# Patient Record
Sex: Female | Born: 1985 | Race: Black or African American | Hispanic: No | Marital: Married | Smoking: Current every day smoker
Health system: Southern US, Community
[De-identification: ages and names within clinical notes are randomized; demographics above are authoritative.]

## PROBLEM LIST (undated history)

## (undated) ENCOUNTER — Inpatient Hospital Stay (HOSPITAL_COMMUNITY): Payer: Self-pay

## (undated) DIAGNOSIS — R87619 Unspecified abnormal cytological findings in specimens from cervix uteri: Secondary | ICD-10-CM

## (undated) DIAGNOSIS — I10 Essential (primary) hypertension: Secondary | ICD-10-CM

## (undated) DIAGNOSIS — O24419 Gestational diabetes mellitus in pregnancy, unspecified control: Secondary | ICD-10-CM

## (undated) DIAGNOSIS — R87629 Unspecified abnormal cytological findings in specimens from vagina: Secondary | ICD-10-CM

## (undated) DIAGNOSIS — N809 Endometriosis, unspecified: Secondary | ICD-10-CM

## (undated) DIAGNOSIS — Z3400 Encounter for supervision of normal first pregnancy, unspecified trimester: Secondary | ICD-10-CM

## (undated) DIAGNOSIS — E782 Mixed hyperlipidemia: Secondary | ICD-10-CM

## (undated) DIAGNOSIS — F17200 Nicotine dependence, unspecified, uncomplicated: Secondary | ICD-10-CM

## (undated) DIAGNOSIS — G4733 Obstructive sleep apnea (adult) (pediatric): Secondary | ICD-10-CM

## (undated) HISTORY — PX: OTHER SURGICAL HISTORY: SHX169

## (undated) HISTORY — DX: Gestational diabetes mellitus in pregnancy, unspecified control: O24.419

## (undated) HISTORY — DX: Encounter for supervision of normal first pregnancy, unspecified trimester: Z34.00

## (undated) HISTORY — DX: Essential (primary) hypertension: I10

## (undated) HISTORY — DX: Obstructive sleep apnea (adult) (pediatric): G47.33

## (undated) HISTORY — DX: Morbid (severe) obesity due to excess calories: E66.01

## (undated) HISTORY — DX: Endometriosis, unspecified: N80.9

## (undated) HISTORY — DX: Mixed hyperlipidemia: E78.2

## (undated) HISTORY — PX: TONSILECTOMY/ADENOIDECTOMY WITH MYRINGOTOMY: SHX6125

---
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
## 2005-05-10 ENCOUNTER — Emergency Department (HOSPITAL_COMMUNITY): Admission: EM | Admit: 2005-05-10 | Discharge: 2005-05-10 | Payer: Self-pay | Admitting: Family Medicine

## 2006-01-24 ENCOUNTER — Emergency Department (HOSPITAL_COMMUNITY): Admission: EM | Admit: 2006-01-24 | Discharge: 2006-01-24 | Payer: Self-pay | Admitting: Emergency Medicine

## 2006-02-22 ENCOUNTER — Ambulatory Visit: Payer: Self-pay | Admitting: Family Medicine

## 2006-07-29 ENCOUNTER — Emergency Department (HOSPITAL_COMMUNITY): Admission: EM | Admit: 2006-07-29 | Discharge: 2006-07-29 | Payer: Self-pay | Admitting: Emergency Medicine

## 2007-05-05 ENCOUNTER — Emergency Department (HOSPITAL_COMMUNITY): Admission: EM | Admit: 2007-05-05 | Discharge: 2007-05-05 | Payer: Self-pay | Admitting: Emergency Medicine

## 2007-09-01 ENCOUNTER — Encounter (INDEPENDENT_AMBULATORY_CARE_PROVIDER_SITE_OTHER): Payer: Self-pay | Admitting: Family Medicine

## 2007-09-01 ENCOUNTER — Ambulatory Visit: Payer: Self-pay | Admitting: Family Medicine

## 2007-09-01 LAB — CONVERTED CEMR LAB
ALT: 14 units/L (ref 0–35)
Alkaline Phosphatase: 57 units/L (ref 39–117)
Basophils Absolute: 0 10*3/uL (ref 0.0–0.1)
Basophils Relative: 0 % (ref 0–1)
CO2: 22 meq/L (ref 19–32)
Calcium: 9.1 mg/dL (ref 8.4–10.5)
Cholesterol: 161 mg/dL (ref 0–200)
Creatinine, Ser: 1.05 mg/dL (ref 0.40–1.20)
GC Probe Amp, Genital: NEGATIVE
Glucose, Bld: 91 mg/dL (ref 70–99)
LDL Cholesterol: 97 mg/dL (ref 0–99)
MCHC: 33.6 g/dL (ref 30.0–36.0)
Monocytes Absolute: 0.9 10*3/uL (ref 0.1–1.0)
RDW: 13.1 % (ref 11.5–15.5)
Sodium: 141 meq/L (ref 135–145)
Triglycerides: 71 mg/dL (ref <150)
WBC: 8.5 10*3/uL (ref 4.0–10.5)

## 2007-09-02 ENCOUNTER — Ambulatory Visit: Payer: Self-pay | Admitting: *Deleted

## 2008-09-15 ENCOUNTER — Emergency Department (HOSPITAL_COMMUNITY): Admission: EM | Admit: 2008-09-15 | Discharge: 2008-09-15 | Payer: Self-pay | Admitting: Emergency Medicine

## 2008-09-25 ENCOUNTER — Emergency Department (HOSPITAL_COMMUNITY): Admission: EM | Admit: 2008-09-25 | Discharge: 2008-09-25 | Payer: Self-pay | Admitting: Emergency Medicine

## 2008-10-25 ENCOUNTER — Emergency Department (HOSPITAL_COMMUNITY): Admission: EM | Admit: 2008-10-25 | Discharge: 2008-10-25 | Payer: Self-pay | Admitting: Emergency Medicine

## 2008-11-24 ENCOUNTER — Emergency Department (HOSPITAL_COMMUNITY): Admission: EM | Admit: 2008-11-24 | Discharge: 2008-11-24 | Payer: Self-pay | Admitting: Family Medicine

## 2008-12-16 ENCOUNTER — Emergency Department (HOSPITAL_COMMUNITY): Admission: EM | Admit: 2008-12-16 | Discharge: 2008-12-16 | Payer: Self-pay | Admitting: Emergency Medicine

## 2008-12-30 ENCOUNTER — Emergency Department (HOSPITAL_COMMUNITY): Admission: EM | Admit: 2008-12-30 | Discharge: 2008-12-30 | Payer: Self-pay | Admitting: Emergency Medicine

## 2009-01-06 ENCOUNTER — Emergency Department (HOSPITAL_COMMUNITY): Admission: EM | Admit: 2009-01-06 | Discharge: 2009-01-07 | Payer: Self-pay | Admitting: Emergency Medicine

## 2009-05-05 ENCOUNTER — Emergency Department (HOSPITAL_COMMUNITY): Admission: EM | Admit: 2009-05-05 | Discharge: 2009-05-05 | Payer: Self-pay | Admitting: Emergency Medicine

## 2010-08-31 ENCOUNTER — Inpatient Hospital Stay (INDEPENDENT_AMBULATORY_CARE_PROVIDER_SITE_OTHER)
Admission: RE | Admit: 2010-08-31 | Discharge: 2010-08-31 | Disposition: A | Discharge status: Home / Self Care | Payer: 59 | Source: Ambulatory Visit | Attending: Emergency Medicine | Admitting: Emergency Medicine

## 2010-08-31 DIAGNOSIS — N760 Acute vaginitis: Secondary | ICD-10-CM

## 2010-08-31 DIAGNOSIS — R300 Dysuria: Secondary | ICD-10-CM

## 2010-08-31 LAB — WET PREP, GENITAL
Clue Cells Wet Prep HPF POC: NONE SEEN
Trich, Wet Prep: NONE SEEN

## 2010-08-31 LAB — POCT PREGNANCY, URINE: Preg Test, Ur: NEGATIVE

## 2010-08-31 LAB — POCT URINALYSIS DIP (DEVICE)
Nitrite: NEGATIVE
Protein, ur: NEGATIVE mg/dL

## 2010-09-01 LAB — URINE CULTURE: Culture  Setup Time: 201203291607

## 2010-09-01 LAB — GC/CHLAMYDIA PROBE AMP, GENITAL
Chlamydia, DNA Probe: NEGATIVE
GC Probe Amp, Genital: NEGATIVE

## 2010-09-10 LAB — GC/CHLAMYDIA PROBE AMP, GENITAL: Chlamydia, DNA Probe: NEGATIVE

## 2010-09-10 LAB — URINALYSIS, ROUTINE W REFLEX MICROSCOPIC
Bilirubin Urine: NEGATIVE
Glucose, UA: NEGATIVE mg/dL
Glucose, UA: NEGATIVE mg/dL
Hgb urine dipstick: NEGATIVE
Nitrite: NEGATIVE
Protein, ur: NEGATIVE mg/dL
Specific Gravity, Urine: 1.022 (ref 1.005–1.030)
Urobilinogen, UA: 1 mg/dL (ref 0.0–1.0)
Urobilinogen, UA: 0.2 mg/dL (ref 0.0–1.0)

## 2010-09-10 LAB — POCT PREGNANCY, URINE: Preg Test, Ur: NEGATIVE

## 2010-09-12 LAB — GC/CHLAMYDIA PROBE AMP, GENITAL
Chlamydia, DNA Probe: NEGATIVE
GC Probe Amp, Genital: NEGATIVE

## 2010-09-12 LAB — RPR: RPR Ser Ql: NONREACTIVE

## 2010-09-13 LAB — URINALYSIS, ROUTINE W REFLEX MICROSCOPIC
Bilirubin Urine: NEGATIVE
Glucose, UA: NEGATIVE mg/dL
Glucose, UA: NEGATIVE mg/dL
Ketones, ur: NEGATIVE mg/dL
Nitrite: NEGATIVE
Nitrite: NEGATIVE
Protein, ur: NEGATIVE mg/dL
Specific Gravity, Urine: 1.016 (ref 1.005–1.030)
Urobilinogen, UA: 0.2 mg/dL (ref 0.0–1.0)
pH: 5.5 (ref 5.0–8.0)

## 2010-09-13 LAB — WET PREP, GENITAL
Clue Cells Wet Prep HPF POC: NONE SEEN
Trich, Wet Prep: NONE SEEN

## 2010-09-13 LAB — URINE MICROSCOPIC-ADD ON

## 2010-09-13 LAB — GC/CHLAMYDIA PROBE AMP, GENITAL
Chlamydia, DNA Probe: NEGATIVE
GC Probe Amp, Genital: NEGATIVE

## 2010-09-13 LAB — PREGNANCY, URINE: Preg Test, Ur: NEGATIVE

## 2010-10-14 ENCOUNTER — Emergency Department (HOSPITAL_COMMUNITY): Payer: Self-pay

## 2010-10-14 ENCOUNTER — Emergency Department (HOSPITAL_COMMUNITY)
Admission: EM | Admit: 2010-10-14 | Discharge: 2010-10-14 | Disposition: A | Discharge status: Home / Self Care | Payer: Self-pay | Attending: Emergency Medicine | Admitting: Emergency Medicine

## 2010-10-14 DIAGNOSIS — X500XXA Overexertion from strenuous movement or load, initial encounter: Secondary | ICD-10-CM | POA: Insufficient documentation

## 2010-10-14 DIAGNOSIS — S93409A Sprain of unspecified ligament of unspecified ankle, initial encounter: Secondary | ICD-10-CM | POA: Insufficient documentation

## 2010-10-14 DIAGNOSIS — Y92009 Unspecified place in unspecified non-institutional (private) residence as the place of occurrence of the external cause: Secondary | ICD-10-CM | POA: Insufficient documentation

## 2010-10-14 DIAGNOSIS — E669 Obesity, unspecified: Secondary | ICD-10-CM | POA: Insufficient documentation

## 2011-03-30 ENCOUNTER — Emergency Department (HOSPITAL_COMMUNITY)
Admission: EM | Admit: 2011-03-30 | Discharge: 2011-03-30 | Disposition: A | Discharge status: Home / Self Care | Payer: Self-pay | Attending: Emergency Medicine | Admitting: Emergency Medicine

## 2011-03-30 DIAGNOSIS — A499 Bacterial infection, unspecified: Secondary | ICD-10-CM | POA: Insufficient documentation

## 2011-03-30 DIAGNOSIS — N760 Acute vaginitis: Secondary | ICD-10-CM | POA: Insufficient documentation

## 2011-03-30 DIAGNOSIS — B9689 Other specified bacterial agents as the cause of diseases classified elsewhere: Secondary | ICD-10-CM | POA: Insufficient documentation

## 2011-03-30 DIAGNOSIS — E669 Obesity, unspecified: Secondary | ICD-10-CM | POA: Insufficient documentation

## 2011-03-30 DIAGNOSIS — M545 Low back pain, unspecified: Secondary | ICD-10-CM | POA: Insufficient documentation

## 2011-03-30 DIAGNOSIS — R109 Unspecified abdominal pain: Secondary | ICD-10-CM | POA: Insufficient documentation

## 2011-03-30 LAB — POCT I-STAT, CHEM 8
HCT: 42 % (ref 36.0–46.0)
Hemoglobin: 14.3 g/dL (ref 12.0–15.0)
Sodium: 139 mEq/L (ref 135–145)
TCO2: 22 mmol/L (ref 0–100)

## 2011-03-30 LAB — URINALYSIS, ROUTINE W REFLEX MICROSCOPIC
Glucose, UA: NEGATIVE mg/dL
Leukocytes, UA: NEGATIVE
Protein, ur: NEGATIVE mg/dL
Urobilinogen, UA: 0.2 mg/dL (ref 0.0–1.0)

## 2011-03-30 LAB — WET PREP, GENITAL
Trich, Wet Prep: NONE SEEN
Yeast Wet Prep HPF POC: NONE SEEN

## 2012-04-26 IMAGING — CR DG FOOT COMPLETE 3+V*L*
3 series · 3 of 3 positions shown · non-contrast
Comparison: None.

CLINICAL DATA: Dorsal metatarsal pain after fall.

LEFT FOOT - COMPLETE 3+ VIEW

[t foot ap left]
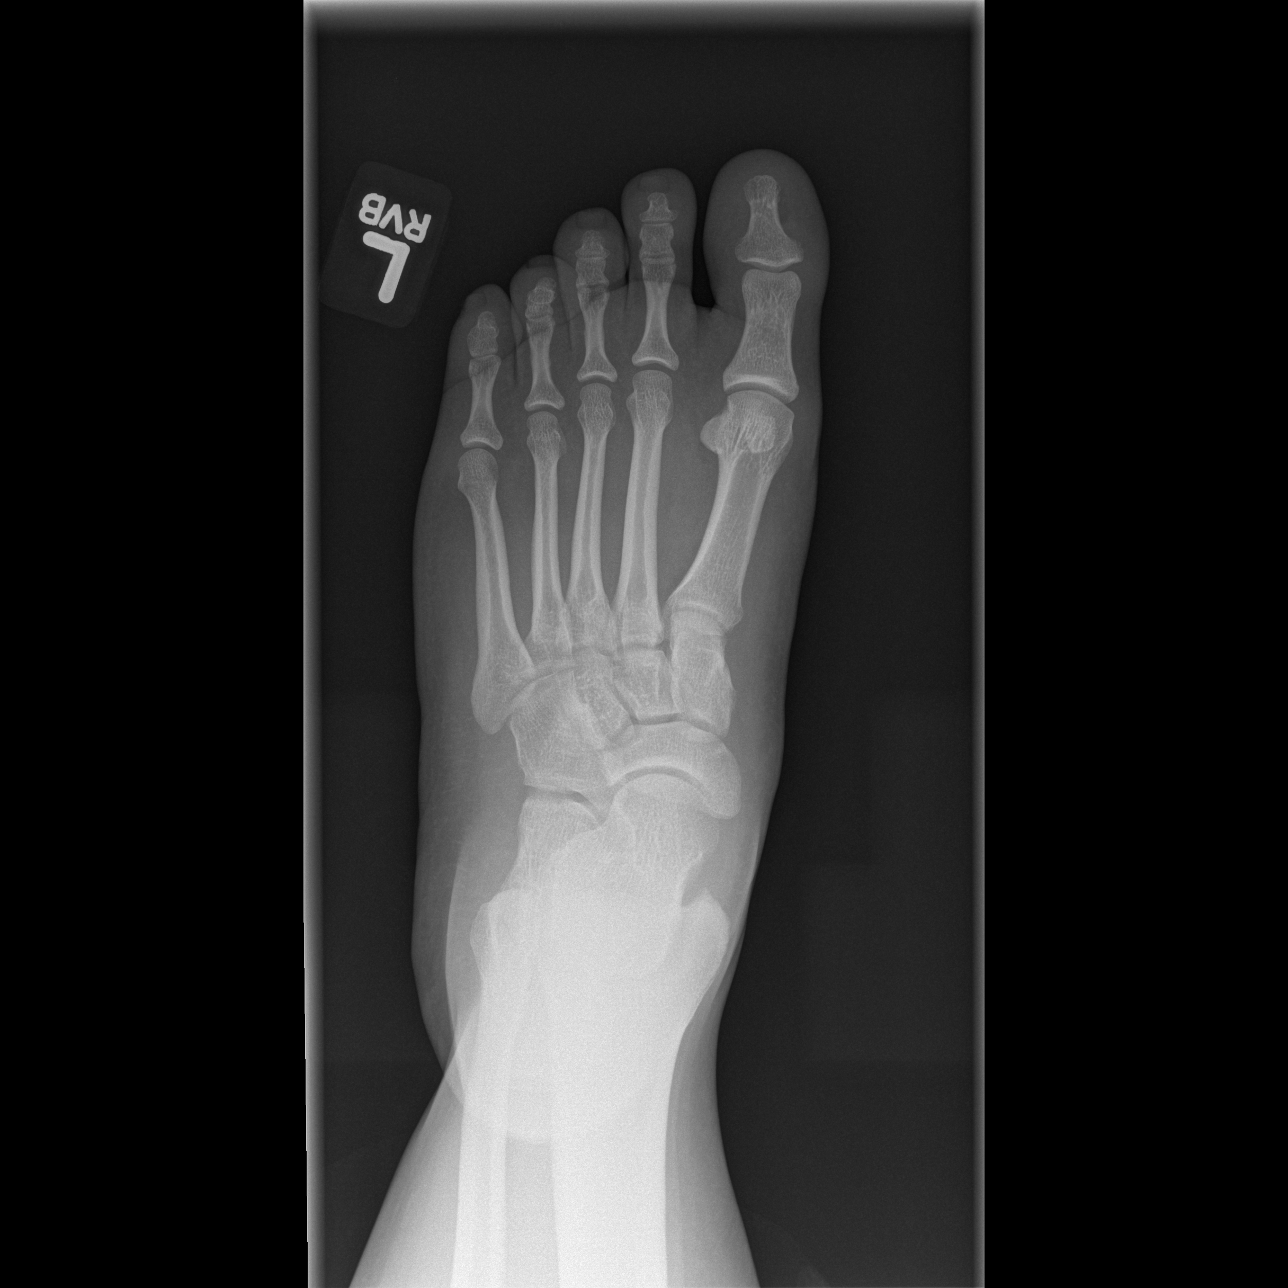

[t foot oblique left]
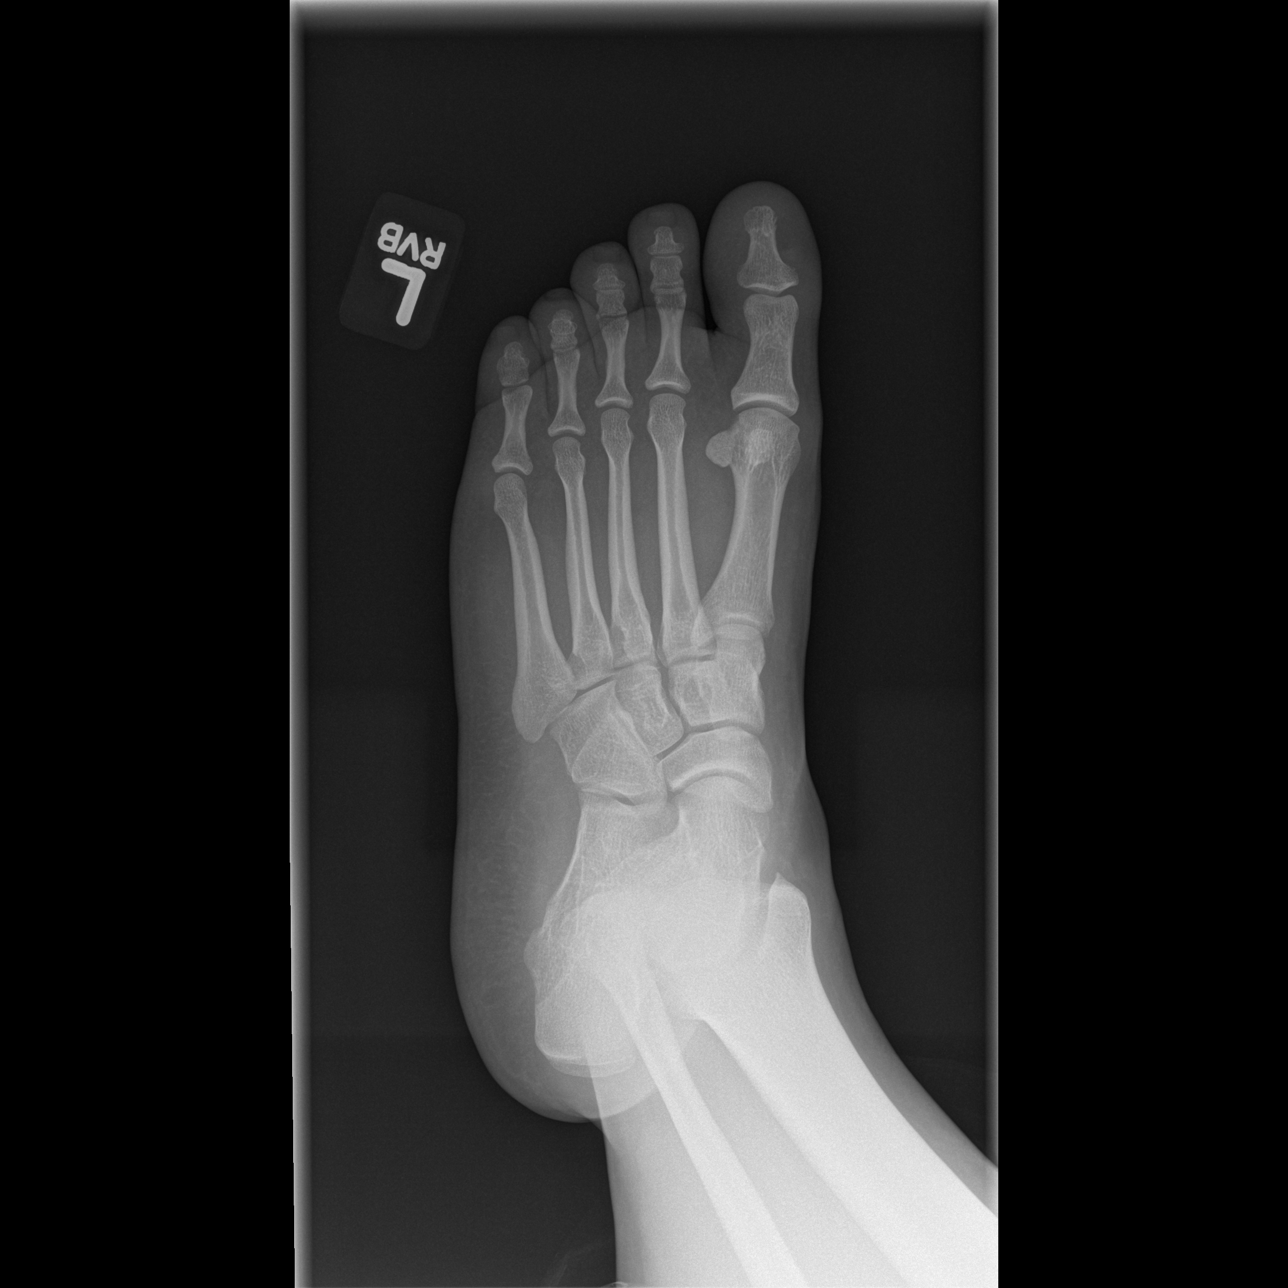

[t foot lat left]
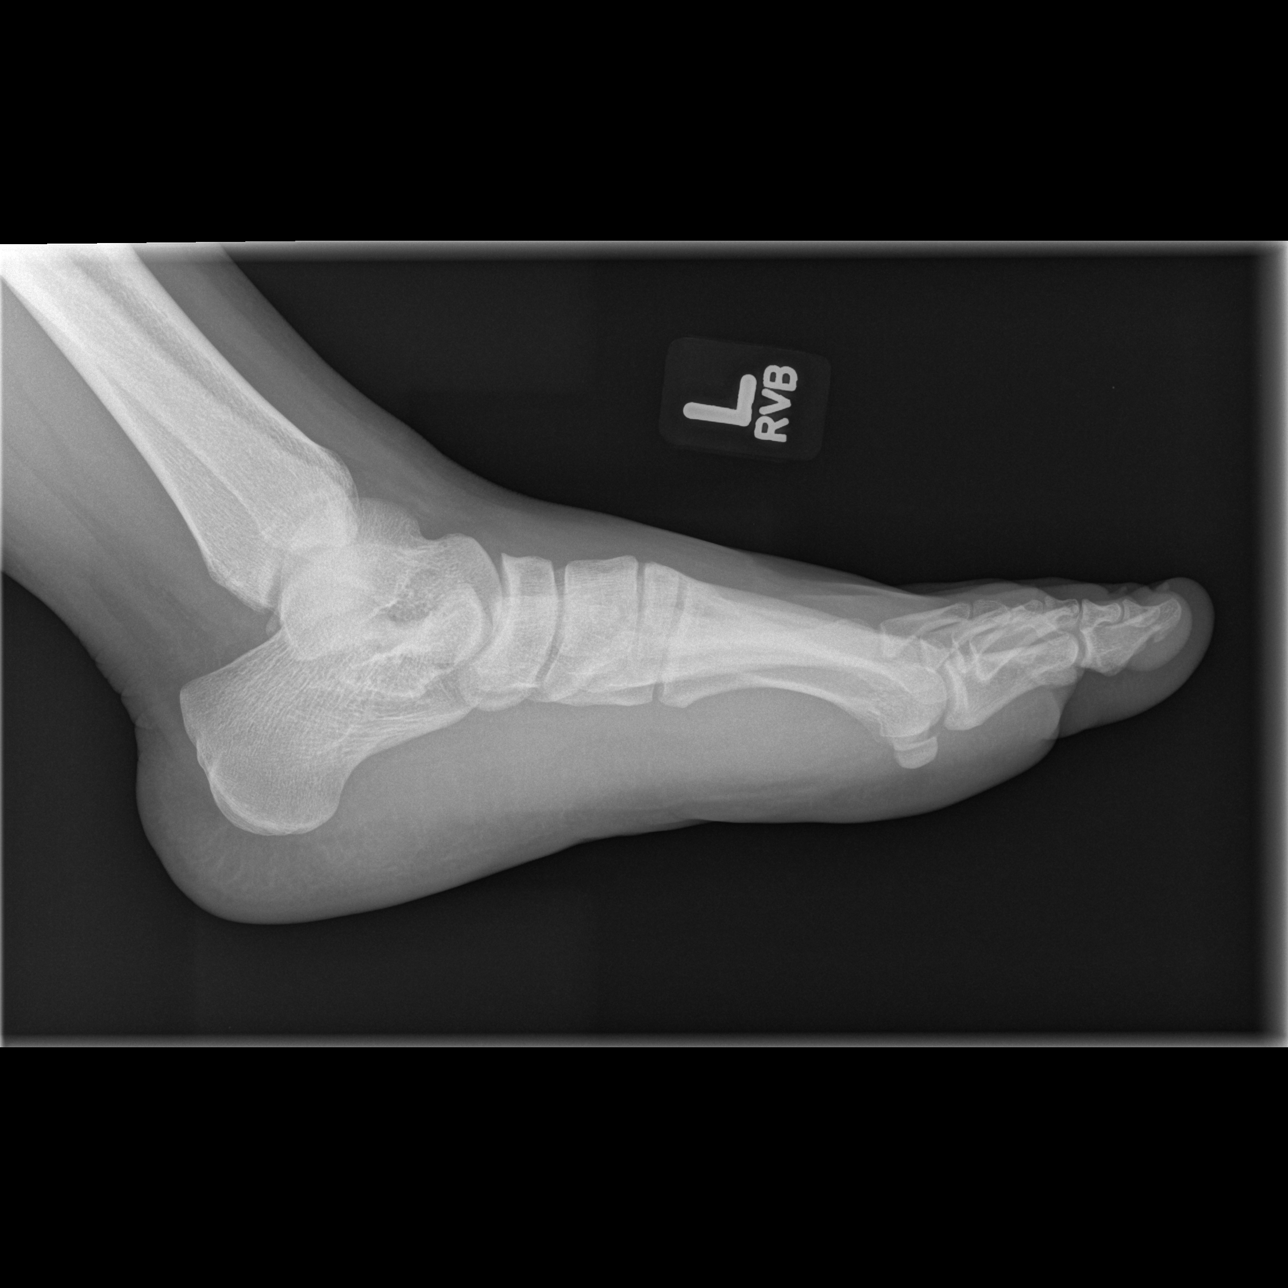

[3 of 3 positions shown; findings below may reference images not displayed]

FINDINGS: There is no evidence for an acute fracture.  Bony
alignment is anatomic. No worrisome lytic or sclerotic osseous
lesion.
IMPRESSION: Normal exam.

## 2012-08-06 ENCOUNTER — Encounter: Payer: Self-pay | Admitting: Family Medicine

## 2012-08-06 ENCOUNTER — Ambulatory Visit: Payer: PRIVATE HEALTH INSURANCE | Admitting: Family Medicine

## 2012-08-06 VITALS — BP 104/62 | HR 72 | Resp 18 | Ht 64.0 in | Wt 264.0 lb

## 2012-08-06 DIAGNOSIS — Z720 Tobacco use: Secondary | ICD-10-CM | POA: Insufficient documentation

## 2012-08-06 DIAGNOSIS — Z0000 Encounter for general adult medical examination without abnormal findings: Secondary | ICD-10-CM

## 2012-08-06 DIAGNOSIS — N912 Amenorrhea, unspecified: Secondary | ICD-10-CM

## 2012-08-06 DIAGNOSIS — Z113 Encounter for screening for infections with a predominantly sexual mode of transmission: Secondary | ICD-10-CM

## 2012-08-06 DIAGNOSIS — Z139 Encounter for screening, unspecified: Secondary | ICD-10-CM

## 2012-08-06 DIAGNOSIS — E66813 Obesity, class 3: Secondary | ICD-10-CM | POA: Insufficient documentation

## 2012-08-06 DIAGNOSIS — Z124 Encounter for screening for malignant neoplasm of cervix: Secondary | ICD-10-CM

## 2012-08-06 DIAGNOSIS — E669 Obesity, unspecified: Secondary | ICD-10-CM

## 2012-08-06 DIAGNOSIS — Z716 Tobacco abuse counseling: Secondary | ICD-10-CM

## 2012-08-06 LAB — COMPREHENSIVE METABOLIC PANEL WITH GFR
Albumin: 4.3 g/dL (ref 3.5–5.2)
BUN: 12 mg/dL (ref 6–23)
CO2: 24 meq/L (ref 19–32)
Glucose, Bld: 75 mg/dL (ref 70–99)
Sodium: 139 meq/L (ref 135–145)
Total Bilirubin: 0.4 mg/dL (ref 0.3–1.2)
Total Protein: 7.3 g/dL (ref 6.0–8.3)

## 2012-08-06 LAB — TSH: TSH: 0.876 u[IU]/mL (ref 0.350–4.500)

## 2012-08-06 LAB — COMPREHENSIVE METABOLIC PANEL
ALT: 21 U/L (ref 0–35)
AST: 22 U/L (ref 0–37)
Alkaline Phosphatase: 56 U/L (ref 39–117)
Calcium: 9.5 mg/dL (ref 8.4–10.5)
Chloride: 106 mEq/L (ref 96–112)
Creat: 1.07 mg/dL (ref 0.50–1.10)
Potassium: 4.4 mEq/L (ref 3.5–5.3)

## 2012-08-06 LAB — POCT URINE PREGNANCY: Preg Test, Ur: NEGATIVE

## 2012-08-06 LAB — HEPATITIS C ANTIBODY: HCV Ab: NEGATIVE

## 2012-08-06 LAB — HIV ANTIBODY (ROUTINE TESTING W REFLEX): HIV: NONREACTIVE

## 2012-08-06 LAB — SYPHILIS: RPR W/REFLEX TO RPR TITER AND TREPONEMAL ANTIBODIES, TRADITIONAL SCREENING AND DIAGNOSIS ALGORITHM

## 2012-08-06 LAB — HEPATITIS B SURFACE ANTIGEN: Hepatitis B Surface Ag: NEGATIVE

## 2012-08-06 MED ORDER — NORGESTIM-ETH ESTRAD TRIPHASIC 0.18/0.215/0.25 MG-25 MCG PO TABS: 1.0000 | ORAL_TABLET | Freq: Every day | ORAL | Status: DC

## 2012-08-06 NOTE — Progress Notes (Deleted)
Subjective:     Patient ID: Ashley Costa, female   DOB: Nov 27, 1985, 27 y.o.   MRN: [id number]  HPI   Review of Systems     Objective:   Physical Exam     Assessment:     ***    Plan:     ***

## 2012-08-06 NOTE — Progress Notes (Signed)
Last Pap: 40981? WNL: Yes Regular Periods:no Contraception: Condoms  Monthly Breast exam:no Tetanus<82yrs:yes Nl.Bladder Function:yes Daily BMs:yes Healthy Diet:yes Calcium:no Mammogram:no Date of Mammogram: N/A Exercise:yes Have often Exercise: Daily Seatbelt: yes Abuse at home: no Stressful work:no Sigmoid-colonoscopy: None Bone Density: No PCP: None Change in PMH: None Change in XBJ:YNWG  Subjective:    Ashley Costa is a 27 y.o. female, No obstetric history on file., who presents for an annual exam. The patient reports irregular menses and amenorrhea x 2 months.  Before cycles monthly x 7 days with moderate bleeding with minimal pain.  LMP was very painful and heavy.  Last sexual intercourse 3 months ago.  Would like to start OCPs.  Previously smoked and quit without intervention.  Also, was previously on metformin for prediabetic, but quit and interesting in restarting.  Menstrual cycle:   LMP: Patient's last menstrual period was 05/25/2012.             Review of Systems Pertinent items are noted in HPI. Denies pelvic pain, urinary tract symptoms, vaginitis symptoms, irregular bleeding, menopausal symptoms, change in bowel habits or rectal bleeding   Objective:    BP 104/62  Pulse 72  Resp 18  Ht 5\' 4"  (1.626 m)  Wt 264 lb (119.75 kg)  BMI 45.29 kg/m2  LMP 05/25/2012     Wt Readings from Last 1 Encounters:  08/06/12 264 lb (119.75 kg)   Body mass index is 45.29 kg/(m^2). General Appearance: Alert, no acute distress.   HEENT: Grossly normal. Neck / Thyroid: Supple, no thyromegaly or cervical adenopathy. Lungs: Clear to auscultation bilaterally. Back: No CVA tenderness. Breast Exam: No masses or nodes.No dimpling, nipple retraction or discharge. Cardiovascular: Regular rate and rhythm.  Gastrointestinal: Soft, non-tender, no masses or organomegaly, obese body habitus. Pelvic Exam: EGBUS-wnl, vagina-normal rugae, nonparous cervix- without lesions or  tenderness, uterus appears normal size shape and consistency, adnexae-no masses or tenderness. Lymphatic Exam: Non-palpable nodes in neck, clavicular,  axillary, or inguinal regions. Skin: no rashes or abnormalities. Extremities: no clubbing cyanosis or edema. Neurologic: grossly normal Psychiatric: Alert and oriented  UPT: negative   Assessment:   1. Routine GYN Exam  2. Smoker 3. Amenorrhea 4. Obesity Plan:   1. PAP sent  2. Smoking Cessation at length.  Plans to use Quitline for additional support.   3. Sunday Start Ortho-tricyclen Lo 1 PO daily, discussed ACHES, increased risk of DVT/PE with smoking, irregular/spotting normal 1st 2 packs.  Patient understands and agrees with plan.  4. Discussed lifestyle changes, will order TSH, CMP, today and f/u at 12 weeks for fasting lipid panel. 5. RTO in 12 weeks with labs.  Elane Fritz, FNP-BC

## 2012-08-06 NOTE — Patient Instructions (Signed)

## 2012-08-07 LAB — HSV 2 ANTIBODY, IGG: HSV 2 Glycoprotein G Ab, IgG: <0.1 IV

## 2012-08-07 LAB — HSV 1 ANTIBODY, IGG: HSV 1 Glycoprotein G Ab, IgG: 0.16 IV

## 2012-08-08 LAB — PAP IG, CT-NG, RFX HPV ASCU
Chlamydia Probe Amp: NEGATIVE
GC Probe Amp: NEGATIVE

## 2012-08-11 LAB — HUMAN PAPILLOMAVIRUS, HIGH RISK: HPV DNA High Risk: DETECTED — AB

## 2012-08-14 ENCOUNTER — Telehealth: Payer: Self-pay

## 2012-08-14 NOTE — Telephone Encounter (Signed)
Spoke with pt informing her pap smear was abnormal and a Colposcopy is needed per LC. Pt states she has an appointment March 20th at 12:30 pm with Dr. Maryellen Pile for results and wants to know if she can have it done then. After speaking with C.W., informed pt we can add this procedure to the appointment already scheduled on Thurs at 12:30 pm. Pt also informed nothing (IC nor douching) in the vagina 24 hrs before the colposcopy and if her period starts before procedure day, call the office to reschedule. Pt is to take 600 mg IB or 2 extra strength Tylenol an hr prior to the procedure. Pt agrees and has no further questions.

## 2012-08-14 NOTE — Telephone Encounter (Signed)
Message copied by Janeece Agee on Thu Aug 14, 2012 12:51 PM ------      Message from: Mathis Bud      Created: Thu Aug 14, 2012 12:33 PM                   ----- Message -----         From: Elane Fritz, FNP         Sent: 08/11/2012   7:26 PM           To: Mathis Bud, CMA            Patient needs to come in for a colposcopy. L.Carter, FNP-BC ------

## 2012-08-21 ENCOUNTER — Other Ambulatory Visit: Payer: Self-pay | Admitting: Obstetrics and Gynecology

## 2012-08-21 ENCOUNTER — Other Ambulatory Visit: Payer: Self-pay

## 2012-08-21 LAB — COMPREHENSIVE METABOLIC PANEL
Albumin: 4.3 g/dL (ref 3.5–5.2)
Alkaline Phosphatase: 47 U/L (ref 39–117)
BUN: 11 mg/dL (ref 6–23)
CO2: 25 mEq/L (ref 19–32)
Calcium: 9.5 mg/dL (ref 8.4–10.5)
Glucose, Bld: 91 mg/dL (ref 70–99)
Potassium: 4.3 mEq/L (ref 3.5–5.3)
Sodium: 139 mEq/L (ref 135–145)
Total Protein: 7.2 g/dL (ref 6.0–8.3)

## 2013-06-04 DIAGNOSIS — N809 Endometriosis, unspecified: Secondary | ICD-10-CM

## 2013-06-04 HISTORY — DX: Endometriosis, unspecified: N80.9

## 2013-06-27 ENCOUNTER — Ambulatory Visit (INDEPENDENT_AMBULATORY_CARE_PROVIDER_SITE_OTHER): Payer: 59 | Admitting: Internal Medicine

## 2013-06-27 VITALS — BP 118/60 | HR 71 | Temp 98.1°F | Resp 16 | Ht 65.0 in | Wt 249.0 lb

## 2013-06-27 DIAGNOSIS — J029 Acute pharyngitis, unspecified: Secondary | ICD-10-CM

## 2013-06-27 DIAGNOSIS — J020 Streptococcal pharyngitis: Secondary | ICD-10-CM

## 2013-06-27 LAB — POCT RAPID STREP A (OFFICE): Rapid Strep A Screen: POSITIVE — AB

## 2013-06-27 MED ORDER — PENICILLIN G BENZATHINE 1200000 UNIT/2ML IM SUSP
1.2000 10*6.[IU] | Freq: Once | INTRAMUSCULAR | Status: AC
  Administered 2013-06-27: 1.2 10*6.[IU] via INTRAMUSCULAR

## 2013-06-27 MED ORDER — LIDOCAINE VISCOUS 2 % MT SOLN: OROMUCOSAL | Status: DC

## 2013-06-27 MED ORDER — TRAMADOL HCL 50 MG PO TABS: 50.0000 mg | ORAL_TABLET | Freq: Four times a day (QID) | ORAL | Status: DC | PRN

## 2013-06-27 NOTE — Progress Notes (Signed)
   Subjective:    Patient ID: Ashley Costa, female    DOB: 26-Aug-1985, 28 y.o.   MRN: [id number] This chart was scribed for Tami Lin, MD by Vernell Barrier, Medical Scribe. This patient's care was started at 1:09 PM.  Sore Throat   Otalgia    HPI Comments: Ashley Costa is a 28 y.o. female who presents to the [facility] complaining of gradually worsening right sided sore throat with mild intermittent cough, onset 1 1/2 weeks ago.Pt states sore throat initially began with head cold so she didn't think anything of it. Head cold as since resolved but sore throat is still persistent. States sore throat irritation got really bad 4 days ago. She reports pain with swallowing to the point she does not want to eat. Pt states she has gargled salt, hydrogen peroxide, sprayed OTC throat chloroseptic, and taken throat lozanges with no relief. Pt also reports right ear pain, onset 4 days ago. Pt states she has been taking copious amounts of Tylenol and Advil prior to today with no relief; has not taken anything today. Pt finally reports episodes of diarrhea, onset 3 days ago; 4 pure liquid episodes on initial date of onset, 2 episodes 2 days ago, no episodes today. Pt has taken a temperature at home, states temp is constantly fluctuating but not feverish; some hot and cold episodes. Denies chills, congestion, postnasal drip, or abdominal pain.  Review of Systems   Objective:   Physical Exam  Vitals reviewed. Constitutional: She is oriented to person, place, and time. She appears well-developed and well-nourished. No distress.  HENT:  Head: Normocephalic and atraumatic.  Mouth/Throat: Oropharyngeal exudate and posterior oropharyngeal erythema present.  Eyes: EOM are normal.  Neck: Neck supple. No tracheal deviation present.  Cardiovascular: Normal rate.   Pulmonary/Chest: Effort normal. No respiratory distress.  Musculoskeletal: Normal range of motion.    Neurological: She is alert and oriented to person, place, and time.  Skin: Skin is warm and dry.  Psychiatric: She has a normal mood and affect. Her behavior is normal.   Results for orders placed in visit on 06/27/13  POCT RAPID STREP A (OFFICE)      Result Value Range   Rapid Strep A Screen Positive (*) Negative   Assessment & Plan:  I have completed the patient encounter in its entirety as documented by the scribe, with editing by me where necessary. Munira Polson P. Laney Pastor, M.D.  Streptococcal sore throat - Plan: penicillin g benzathine (BICILLIN LA) 1200000 UNIT/2ML injection 1.2 Million Units  Sore throat   Meds ordered this encounter  Medications  . penicillin g benzathine (BICILLIN LA) 1200000 UNIT/2ML injection 1.2 Million Units    Sig:   . lidocaine (XYLOCAINE) 2 % solution    Sig: Gargle with 1 teaspoon every 2 hours as needed for pain control. May swallow or spit out.    Dispense:  100 mL    Refill:  0  . traMADol (ULTRAM) 50 MG tablet    Sig: Take 1-2 tablets (50-100 mg total) by mouth every 6 (six) hours as needed.    Dispense:  20 tablet    Refill:  0

## 2013-11-23 ENCOUNTER — Encounter (HOSPITAL_COMMUNITY): Payer: Self-pay | Admitting: Emergency Medicine

## 2013-11-23 ENCOUNTER — Emergency Department (HOSPITAL_COMMUNITY)
Admission: EM | Admit: 2013-11-23 | Discharge: 2013-11-23 | Disposition: A | Discharge status: Home / Self Care | Payer: BC Managed Care – PPO | Source: Home / Self Care | Attending: Family Medicine | Admitting: Family Medicine

## 2013-11-23 DIAGNOSIS — R102 Pelvic and perineal pain: Secondary | ICD-10-CM

## 2013-11-23 DIAGNOSIS — N949 Unspecified condition associated with female genital organs and menstrual cycle: Secondary | ICD-10-CM

## 2013-11-23 LAB — POCT URINALYSIS DIP (DEVICE)
Bilirubin Urine: NEGATIVE
Glucose, UA: NEGATIVE mg/dL
HGB URINE DIPSTICK: NEGATIVE
KETONES UR: NEGATIVE mg/dL
Nitrite: NEGATIVE
PH: 7 (ref 5.0–8.0)
Protein, ur: NEGATIVE mg/dL
Specific Gravity, Urine: 1.02 (ref 1.005–1.030)
Urobilinogen, UA: 0.2 mg/dL (ref 0.0–1.0)

## 2013-11-23 LAB — POCT PREGNANCY, URINE: Preg Test, Ur: NEGATIVE

## 2013-11-23 NOTE — Discharge Instructions (Signed)
[facility] problem continues

## 2013-11-23 NOTE — ED Provider Notes (Signed)
CSN: [id number]     Arrival date & time 11/23/13  1327 History   First MD Initiated Contact with Patient 11/23/13 1412     Chief Complaint  Patient presents with  . Abdominal Pain   (Consider location/radiation/quality/duration/timing/severity/associated sxs/prior Treatment) Patient is a 28 y.o. female presenting with abdominal pain. The history is provided by the patient.  Abdominal Pain Pain location:  Suprapubic Pain quality: cramping and sharp   Pain radiates to:  Does not radiate Pain severity:  Mild Onset quality:  Sudden Duration:  3 days Progression:  Unchanged Chronicity:  New Context comment:  Lmp 4/25, no bcp, married Associated symptoms: no constipation, no diarrhea, no dysuria, no fever, no nausea, no vaginal bleeding, no vaginal discharge and no vomiting     History reviewed. No pertinent past medical history. Past Surgical History  Procedure Laterality Date  . Tonsilectomy/adenoidectomy with myringotomy    . Wisdom teeth abstracted     Family History  Problem Relation Age of Onset  . Diabetes Father   . Hypertension Father   . Diabetes Mother   . Hypertension Mother    History  Substance Use Topics  . Smoking status: Current Every Day Smoker    Types: Cigarettes  . Smokeless tobacco: Not on file  . Alcohol Use: Yes   OB History   Grav Para Term Preterm Abortions TAB SAB Ect Mult Living                 Review of Systems  Constitutional: Negative.  Negative for fever.  Respiratory: Negative.        Breast tenderness bilat.  Gastrointestinal: Positive for abdominal pain. Negative for nausea, vomiting, diarrhea and constipation.  Genitourinary: Positive for menstrual problem and pelvic pain. Negative for dysuria, urgency, vaginal bleeding and vaginal discharge.  Neurological: Positive for headaches.    Allergies  Review of patient's allergies indicates no known allergies.  Home Medications   Prior to Admission medications   Medication Sig Start  Date End Date Taking? Authorizing Provider  lidocaine (XYLOCAINE) 2 % solution Gargle with 1 teaspoon every 2 hours as needed for pain control. May swallow or spit out. 06/27/13   Leandrew Koyanagi, MD  Norgestimate-Ethinyl Estradiol Triphasic (ORTHO TRI-CYCLEN LO) 0.18/0.215/0.25 MG-25 MCG tab Take 1 tablet by mouth daily. 08/06/12   Delma Officer, FNP  traMADol (ULTRAM) 50 MG tablet Take 1-2 tablets (50-100 mg total) by mouth every 6 (six) hours as needed. 06/27/13   Leandrew Koyanagi, MD   BP 136/86  Pulse 76  Temp(Src) 98.5 F (36.9 C) (Oral)  Resp 16  LMP 09/28/2013 Physical Exam  Nursing note and vitals reviewed. Constitutional: She is oriented to person, place, and time. She appears well-developed and well-nourished.  Abdominal: Soft. Bowel sounds are normal. She exhibits no distension and no mass. There is tenderness. There is no rebound and no guarding.  Musculoskeletal: She exhibits no edema.  Neurological: She is alert and oriented to person, place, and time.  Skin: Skin is warm and dry.    ED Course  Procedures (including critical care time) Labs Review Labs Reviewed  POCT URINALYSIS DIP (DEVICE) - Abnormal; Notable for the following:    Leukocytes, UA TRACE (*)    All other components within normal limits  POCT PREGNANCY, URINE    Imaging Review No results found.   MDM   1. Pelvic pain in female        Billy Fischer, MD 11/23/13 [phone]

## 2013-11-23 NOTE — ED Notes (Signed)
Pt c/o intermittent abd pain onset 3 days LMP = 09/28/13 Denies urinary sx, gyn sx Alert w/no signs of acute distress.

## 2014-03-24 ENCOUNTER — Emergency Department (HOSPITAL_COMMUNITY)
Admission: EM | Admit: 2014-03-24 | Discharge: 2014-03-24 | Disposition: A | Discharge status: Home / Self Care | Payer: BC Managed Care – PPO | Attending: Emergency Medicine | Admitting: Emergency Medicine

## 2014-03-24 ENCOUNTER — Encounter (HOSPITAL_COMMUNITY): Payer: Self-pay | Admitting: Emergency Medicine

## 2014-03-24 DIAGNOSIS — S3991XA Unspecified injury of abdomen, initial encounter: Secondary | ICD-10-CM | POA: Diagnosis not present

## 2014-03-24 DIAGNOSIS — Y9389 Activity, other specified: Secondary | ICD-10-CM | POA: Diagnosis not present

## 2014-03-24 DIAGNOSIS — Y92410 Unspecified street and highway as the place of occurrence of the external cause: Secondary | ICD-10-CM | POA: Diagnosis not present

## 2014-03-24 DIAGNOSIS — S39012A Strain of muscle, fascia and tendon of lower back, initial encounter: Secondary | ICD-10-CM | POA: Diagnosis not present

## 2014-03-24 DIAGNOSIS — Z87891 Personal history of nicotine dependence: Secondary | ICD-10-CM | POA: Insufficient documentation

## 2014-03-24 DIAGNOSIS — Z79899 Other long term (current) drug therapy: Secondary | ICD-10-CM | POA: Diagnosis not present

## 2014-03-24 DIAGNOSIS — S3992XA Unspecified injury of lower back, initial encounter: Secondary | ICD-10-CM | POA: Diagnosis present

## 2014-03-24 MED ORDER — MORPHINE SULFATE 4 MG/ML IJ SOLN
6.0000 mg | Freq: Once | INTRAMUSCULAR | Status: AC
Start: 2014-03-24 — End: 2014-03-24
  Administered 2014-03-24: 6 mg via INTRAMUSCULAR
  Filled 2014-03-24: qty 2

## 2014-03-24 MED ORDER — CYCLOBENZAPRINE HCL 10 MG PO TABS: 10.0000 mg | ORAL_TABLET | Freq: Three times a day (TID) | ORAL | Status: DC | PRN

## 2014-03-24 MED ORDER — NAPROXEN 375 MG PO TABS: 375.0000 mg | ORAL_TABLET | Freq: Two times a day (BID) | ORAL | Status: DC

## 2014-03-24 MED ORDER — KETOROLAC TROMETHAMINE 15 MG/ML IJ SOLN
15.0000 mg | Freq: Once | INTRAMUSCULAR | Status: AC
  Administered 2014-03-24: 15 mg via INTRAMUSCULAR
  Filled 2014-03-24: qty 1

## 2014-03-24 MED ORDER — LORAZEPAM 0.5 MG PO TABS
0.5000 mg | ORAL_TABLET | Freq: Once | ORAL | Status: AC
  Administered 2014-03-24: 0.5 mg via ORAL
  Filled 2014-03-24: qty 1
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
## 2014-03-24 NOTE — Discharge Instructions (Signed)

## 2014-03-24 NOTE — ED Notes (Signed)
Bed: WA01 Expected date:  Expected time:  Means of arrival:  Comments: 
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
## 2015-05-03 ENCOUNTER — Other Ambulatory Visit (HOSPITAL_COMMUNITY): Payer: Self-pay | Admitting: Obstetrics

## 2015-05-03 DIAGNOSIS — Z3149 Encounter for other procreative investigation and testing: Secondary | ICD-10-CM

## 2015-05-09 ENCOUNTER — Ambulatory Visit (HOSPITAL_COMMUNITY)
Admission: RE | Admit: 2015-05-09 | Discharge: 2015-05-09 | Disposition: A | Discharge status: Home / Self Care | Payer: BLUE CROSS/BLUE SHIELD | Source: Ambulatory Visit | Attending: Obstetrics | Admitting: Obstetrics

## 2015-05-09 DIAGNOSIS — N979 Female infertility, unspecified: Secondary | ICD-10-CM | POA: Diagnosis present

## 2015-05-09 DIAGNOSIS — Z3149 Encounter for other procreative investigation and testing: Secondary | ICD-10-CM

## 2015-05-09 MED ORDER — IOHEXOL 300 MG/ML  SOLN
30.0000 mL | Freq: Once | INTRAMUSCULAR | Status: AC | PRN
  Administered 2015-05-09: 30 mL

## 2016-04-17 ENCOUNTER — Encounter: Payer: Self-pay | Admitting: Obstetrics & Gynecology

## 2016-04-17 ENCOUNTER — Ambulatory Visit (INDEPENDENT_AMBULATORY_CARE_PROVIDER_SITE_OTHER): Payer: BLUE CROSS/BLUE SHIELD | Admitting: Obstetrics & Gynecology

## 2016-04-17 VITALS — BP 111/64 | HR 69 | Temp 97.8°F | Wt 243.8 lb

## 2016-04-17 DIAGNOSIS — O0902 Supervision of pregnancy with history of infertility, second trimester: Secondary | ICD-10-CM

## 2016-04-17 DIAGNOSIS — Z1151 Encounter for screening for human papillomavirus (HPV): Secondary | ICD-10-CM

## 2016-04-17 DIAGNOSIS — Z124 Encounter for screening for malignant neoplasm of cervix: Secondary | ICD-10-CM | POA: Diagnosis not present

## 2016-04-17 DIAGNOSIS — Z3402 Encounter for supervision of normal first pregnancy, second trimester: Secondary | ICD-10-CM

## 2016-04-17 DIAGNOSIS — Z113 Encounter for screening for infections with a predominantly sexual mode of transmission: Secondary | ICD-10-CM | POA: Diagnosis not present

## 2016-04-17 DIAGNOSIS — O0900 Supervision of pregnancy with history of infertility, unspecified trimester: Secondary | ICD-10-CM | POA: Insufficient documentation

## 2016-04-17 DIAGNOSIS — O0901 Supervision of pregnancy with history of infertility, first trimester: Secondary | ICD-10-CM

## 2016-04-17 DIAGNOSIS — Z3400 Encounter for supervision of normal first pregnancy, unspecified trimester: Secondary | ICD-10-CM | POA: Insufficient documentation

## 2016-04-17 NOTE — Progress Notes (Signed)
  Subjective:infertilty hx, referred by Earnie Larsson is a G1P0 [redacted]w[redacted]d being seen today for her first obstetrical visit.  Her obstetrical history is significant for obesity and infertility, s/p ovulation induction with Femara. Patient does intend to breast feed. Pregnancy history fully reviewed.  Patient reports no complaints.  Vitals:   04/17/16 1437  BP: 111/64  Pulse: 69  Temp: 97.8 F (36.6 C)  Weight: 243 lb 12.8 oz (110.6 kg)    HISTORY: OB History  Gravida Para Term Preterm AB Living  1            SAB TAB Ectopic Multiple Live Births               # Outcome Date GA Lbr Len/2nd Weight Sex Delivery Anes PTL Lv  1 Current              Past Medical History:  Diagnosis Date  . Endometriosis 2015   Past Surgical History:  Procedure Laterality Date  . TONSILECTOMY/ADENOIDECTOMY WITH MYRINGOTOMY    . Wisdom teeth abstracted     Family History  Problem Relation Age of Onset  . Diabetes Father   . Hypertension Father   . Diabetes Mother   . Hypertension Mother      Exam    Uterus:     Pelvic Exam:    Perineum: No Hemorrhoids   Vulva: normal   Vagina:  normal mucosa   pH:     Cervix: no lesions   Adnexa: normal adnexa   Bony Pelvis: average  System: Breast:  normal appearance, no masses or tenderness   Skin: normal coloration and turgor, no rashes    Neurologic: oriented, normal mood   Extremities: normal strength, tone, and muscle mass   HEENT extra ocular movement intact, sclera clear, anicteric and thyroid without masses   Mouth/Teeth mucous membranes moist, pharynx normal without lesions and dental hygiene good   Neck supple   Cardiovascular: regular rate and rhythm, no murmurs or gallops   Respiratory:  appears well, vitals normal, no respiratory distress, acyanotic, normal RR, neck free of mass or lymphadenopathy, chest clear, no wheezing, crepitations, rhonchi, normal symmetric air entry   Abdomen: abnormal findings:  obese   Urinary: urethral meatus normal      Assessment:    Pregnancy: G1P0 Patient Active Problem List   Diagnosis Date Noted  . Supervision of normal first pregnancy, antepartum 04/17/2016  . Pregnancy with history of infertility 04/17/2016  . Tobacco abuse 08/06/2012  . Obesity, Class III, BMI 40-49.9 (morbid obesity) ([place]) 08/06/2012        Plan:     Initial labs drawn. Prenatal vitamins. Problem list reviewed and updated. Genetic Screening discussed [id number]  Ultrasound discussed; fetal survey: 18+ weeks.  Follow up in 4 weeks. 50% of 30 min visit spent on counseling and coordination of care.  Will need early GDM screen   Ahsan Esterline 04/17/2016

## 2016-04-17 NOTE — Progress Notes (Signed)
Patient states that she feels good today.

## 2016-04-17 NOTE — Addendum Note (Signed)
Addended by: Tristan Schroeder D on: 04/17/2016 04:56 PM   Modules accepted: Orders

## 2016-04-18 LAB — GC/CHLAMYDIA PROBE AMP (~~LOC~~) NOT AT ARMC
CHLAMYDIA, DNA PROBE: NEGATIVE
NEISSERIA GONORRHEA: NEGATIVE

## 2016-04-20 LAB — CYTOLOGY - PAP
Diagnosis: NEGATIVE
HPV 16/18/45 GENOTYPING: NEGATIVE
HPV: DETECTED — AB

## 2016-04-23 LAB — URINE CULTURE, OB REFLEX

## 2016-04-23 LAB — CULTURE, OB URINE

## 2016-04-24 LAB — PRENATAL PROFILE I(LABCORP)
Antibody Screen: NEGATIVE
BASOS ABS: 0 10*3/uL (ref 0.0–0.2)
Basos: 0 %
EOS (ABSOLUTE): 0.1 10*3/uL (ref 0.0–0.4)
Eos: 1 %
HEMOGLOBIN: 12.1 g/dL (ref 11.1–15.9)
Hematocrit: 36.3 % (ref 34.0–46.6)
Hepatitis B Surface Ag: NEGATIVE
IMMATURE GRANULOCYTES: 0 %
Immature Grans (Abs): 0 10*3/uL (ref 0.0–0.1)
LYMPHS ABS: 3.2 10*3/uL — AB (ref 0.7–3.1)
Lymphs: 34 %
MCH: 27.8 pg (ref 26.6–33.0)
MCHC: 33.3 g/dL (ref 31.5–35.7)
MCV: 83 fL (ref 79–97)
MONOS ABS: 0.9 10*3/uL (ref 0.1–0.9)
Monocytes: 10 %
NEUTROS ABS: 5.2 10*3/uL (ref 1.4–7.0)
NEUTROS PCT: 55 %
PLATELETS: 371 10*3/uL (ref 150–379)
RBC: 4.36 x10E6/uL (ref 3.77–5.28)
RDW: 14.2 % (ref 12.3–15.4)
RPR Ser Ql: NONREACTIVE
Rh Factor: POSITIVE
Rubella Antibodies, IGG: 1.56 index (ref >0.99)
WBC: 9.4 10*3/uL (ref 3.4–10.8)

## 2016-04-24 LAB — TOXASSURE SELECT 13 (MW), URINE

## 2016-04-24 LAB — HIV ANTIBODY (ROUTINE TESTING W REFLEX): HIV SCREEN 4TH GENERATION: NONREACTIVE

## 2016-04-25 LAB — MATERNIT21 PLUS CORE NO GENDER
CHROMOSOME 13: NEGATIVE
CHROMOSOME 21: NEGATIVE
Chromosome 18: NEGATIVE

## 2016-05-15 ENCOUNTER — Ambulatory Visit (INDEPENDENT_AMBULATORY_CARE_PROVIDER_SITE_OTHER): Payer: BLUE CROSS/BLUE SHIELD | Admitting: Obstetrics

## 2016-05-15 ENCOUNTER — Encounter: Payer: Self-pay | Admitting: Obstetrics

## 2016-05-15 VITALS — BP 113/76 | HR 79 | Temp 97.6°F | Wt 240.3 lb

## 2016-05-15 DIAGNOSIS — Z3400 Encounter for supervision of normal first pregnancy, unspecified trimester: Secondary | ICD-10-CM

## 2016-05-15 DIAGNOSIS — Z3402 Encounter for supervision of normal first pregnancy, second trimester: Secondary | ICD-10-CM

## 2016-05-15 MED ORDER — ENFAMIL EXPECTA 28-0.8 & 200 MG PO MISC: 2.0000 | Freq: Every day | ORAL | 3 refills | Status: DC

## 2016-05-15 NOTE — Progress Notes (Signed)
  Subjective:    Ashley Costa is a 30 y.o. female being seen today for her obstetrical visit. She is at [redacted]w[redacted]d gestation. Patient reports: no complaints.  Problem List Items Addressed This Visit    Supervision of normal first pregnancy, antepartum - Primary   Relevant Medications   Prenatal MV-Min-Fe Fum-FA-DHA (ENFAMIL EXPECTA) 28-0.8 & 200 MG MISC   Other Relevant Orders   AFP, Serum, Open Spina Bifida   US OB Comp + 14 Wk     Patient Active Problem List   Diagnosis Date Noted  . Supervision of normal first pregnancy, antepartum 04/17/2016  . Pregnancy with history of infertility 04/17/2016  . Tobacco abuse 08/06/2012  . Obesity, Class III, BMI 40-49.9 (morbid obesity) ([place]) 08/06/2012    Objective:     BP 113/76   Pulse 79   Temp 97.6 F (36.4 C)   Wt 240 lb 4.8 oz (109 kg)   LMP 01/22/2016   BMI 39.99 kg/m  Uterine Size: Below umbilicus     Assessment:    Pregnancy @ [redacted]w[redacted]d  weeks Doing well    Plan:    Problem list reviewed and updated. Labs reviewed.  Follow up in 4 weeks. FIRST/CF mutation testing/NIPT/QUAD SCREEN/fragile X/Ashkenazi Jewish population testing/Spinal muscular atrophy discussed: ordered. Role of ultrasound in pregnancy discussed; fetal survey: ordered. Amniocentesis discussed: not indicated. 50% of 15 minute visit spent on counseling and coordination of care.

## 2016-05-15 NOTE — Progress Notes (Signed)
Patient would like Expecta prenatal vitamins be Enfamil

## 2016-05-17 LAB — AFP, SERUM, OPEN SPINA BIFIDA
AFP MoM: 0.97
AFP Value: 27.6 ng/mL
GEST. AGE ON COLLECTION DATE: 16.3 wk
Maternal Age At EDD: 30.8 years
OSBR RISK 1 IN: 10000
Test Results:: NEGATIVE
Weight: 240 [lb_av]

## 2016-05-31 ENCOUNTER — Other Ambulatory Visit: Payer: BLUE CROSS/BLUE SHIELD

## 2016-06-01 ENCOUNTER — Other Ambulatory Visit: Payer: BLUE CROSS/BLUE SHIELD

## 2016-06-01 ENCOUNTER — Ambulatory Visit (INDEPENDENT_AMBULATORY_CARE_PROVIDER_SITE_OTHER): Payer: BLUE CROSS/BLUE SHIELD

## 2016-06-01 DIAGNOSIS — Z3482 Encounter for supervision of other normal pregnancy, second trimester: Secondary | ICD-10-CM | POA: Diagnosis not present

## 2016-06-01 DIAGNOSIS — Z3400 Encounter for supervision of normal first pregnancy, unspecified trimester: Secondary | ICD-10-CM

## 2016-06-05 ENCOUNTER — Telehealth: Payer: Self-pay | Admitting: *Deleted

## 2016-06-05 NOTE — Telephone Encounter (Signed)
Call placed to pt to review PNV status. Pt made aware that [employer] will not cover. Pt states she no longer has this ins, has Medicaid. Pt states she spoke with pharmacy and Medicaid will not cover this PNV. Pt states that she really wants this PNV and plans to pay out of pocket for Rx. Pt advised to call office if she decides she wants to change to a PNV that is covered.

## 2016-06-12 ENCOUNTER — Encounter: Payer: Self-pay | Admitting: Obstetrics

## 2016-06-12 ENCOUNTER — Ambulatory Visit (INDEPENDENT_AMBULATORY_CARE_PROVIDER_SITE_OTHER): Payer: Medicaid Other | Admitting: Obstetrics

## 2016-06-12 VITALS — BP 118/78 | HR 75 | Wt 238.0 lb

## 2016-06-12 DIAGNOSIS — Z3480 Encounter for supervision of other normal pregnancy, unspecified trimester: Secondary | ICD-10-CM

## 2016-06-12 DIAGNOSIS — Z3400 Encounter for supervision of normal first pregnancy, unspecified trimester: Secondary | ICD-10-CM

## 2016-06-12 DIAGNOSIS — Z3482 Encounter for supervision of other normal pregnancy, second trimester: Secondary | ICD-10-CM | POA: Diagnosis not present

## 2016-06-12 NOTE — Progress Notes (Signed)
Subjective:    Shelitha Mccrimmon is a 31 y.o. female being seen today for her obstetrical visit. She is at [redacted]w[redacted]d gestation. Patient reports: no complaints . Fetal movement: normal.  Problem List Items Addressed This Visit    Supervision of normal first pregnancy, antepartum     Patient Active Problem List   Diagnosis Date Noted  . Supervision of normal first pregnancy, antepartum 04/17/2016  . Pregnancy with history of infertility 04/17/2016  . Tobacco abuse 08/06/2012  . Obesity, Class III, BMI 40-49.9 (morbid obesity) ([place]) 08/06/2012   Objective:    BP 118/78   Pulse 75   Wt 238 lb (108 kg)   LMP 01/22/2016   BMI 39.61 kg/m  FHT: 150 BPM  Uterine Size: size equals dates     Assessment:    Pregnancy @ [redacted]w[redacted]d    Plan:    Signs and symptoms of preterm labor: discussed.  Labs, problem list reviewed and updated 2 hr GTT planned Follow up in 4 weeks.  Patient ID: Shayonna Whitinger, female   DOB: Dec 31, 1985, 31 y.o.   MRN: [id number]

## 2016-07-10 ENCOUNTER — Encounter: Payer: Self-pay | Admitting: Obstetrics

## 2016-07-10 ENCOUNTER — Ambulatory Visit (INDEPENDENT_AMBULATORY_CARE_PROVIDER_SITE_OTHER): Payer: Medicaid Other | Admitting: Obstetrics

## 2016-07-10 VITALS — BP 120/79 | HR 92 | Temp 98.4°F | Wt 236.4 lb

## 2016-07-10 DIAGNOSIS — Z3482 Encounter for supervision of other normal pregnancy, second trimester: Secondary | ICD-10-CM

## 2016-07-10 DIAGNOSIS — Z3480 Encounter for supervision of other normal pregnancy, unspecified trimester: Secondary | ICD-10-CM

## 2016-07-10 NOTE — Progress Notes (Signed)
Subjective:  Aamira Jasko is a 31 y.o. G1P0 at [redacted]w[redacted]d being seen today for ongoing prenatal care.  She is currently monitored for the following issues for this low-risk pregnancy and has Tobacco abuse; Obesity, Class III, BMI 40-49.9 (morbid obesity) ([place]); Supervision of normal first pregnancy, antepartum; and Pregnancy with history of infertility on her problem list.  Patient reports runny nose, fever and chills.  Contractions: Not present. Vag. Bleeding: None.  Movement: Present. Denies leaking of fluid.   The following portions of the patient's history were reviewed and updated as appropriate: allergies, current medications, past family history, past medical history, past social history, past surgical history and problem list. Problem list updated.  Objective:   Vitals:   07/10/16 1409 07/10/16 1411  BP: 134/82 120/79  Pulse: (!) 102 92  Temp: 98.4 F (36.9 C)   Weight: 236 lb 6.4 oz (107.2 kg)     Fetal Status: Fetal Heart Rate (bpm): 150   Movement: Present     General:  Alert, oriented and cooperative. Patient is in no acute distress.  Skin: Skin is warm and dry. No rash noted.   Cardiovascular: Normal heart rate noted  Respiratory: Normal respiratory effort, no problems with respiration noted  Abdomen: Soft, gravid, appropriate for gestational age. Pain/Pressure: Absent     Pelvic:  Cervical exam deferred        Extremities: Normal range of motion.  Edema: None  Mental Status: Normal mood and affect. Normal behavior. Normal judgment and thought content.   Urinalysis:      Assessment and Plan:  Pregnancy: G1P0 at [redacted]w[redacted]d  There are no diagnoses linked to this encounter. Preterm labor symptoms and general obstetric precautions including but not limited to vaginal bleeding, contractions, leaking of fluid and fetal movement were reviewed in detail with the patient. Please refer to After Visit Summary for other counseling recommendations.  No Follow-up on file.   Shelly Bombard, MDPatient ID: [name], female   DOB: 1986/05/03, 31 y.o.   MRN: [id number]

## 2016-07-10 NOTE — Progress Notes (Signed)
Patient complains of having chills and a fever.  She is taking OTC Coricidin Cold and Flu -HBP. Baby is hardly kicking.

## 2016-08-07 ENCOUNTER — Other Ambulatory Visit: Payer: Medicaid Other

## 2016-08-07 ENCOUNTER — Encounter: Payer: Self-pay | Admitting: *Deleted

## 2016-08-07 ENCOUNTER — Encounter: Payer: Self-pay | Admitting: Obstetrics

## 2016-08-07 ENCOUNTER — Ambulatory Visit (INDEPENDENT_AMBULATORY_CARE_PROVIDER_SITE_OTHER): Payer: Medicaid Other | Admitting: Obstetrics

## 2016-08-07 VITALS — BP 115/74 | HR 79 | Wt 239.0 lb

## 2016-08-07 DIAGNOSIS — Z3480 Encounter for supervision of other normal pregnancy, unspecified trimester: Secondary | ICD-10-CM

## 2016-08-07 DIAGNOSIS — Z3400 Encounter for supervision of normal first pregnancy, unspecified trimester: Secondary | ICD-10-CM

## 2016-08-07 DIAGNOSIS — Z3403 Encounter for supervision of normal first pregnancy, third trimester: Secondary | ICD-10-CM

## 2016-08-07 NOTE — Progress Notes (Signed)
Subjective:  Ashley Costa is a 31 y.o. G1P0 at [redacted]w[redacted]d being seen today for ongoing prenatal care.  She is currently monitored for the following issues for this low-risk pregnancy and has Tobacco abuse; Obesity, Class III, BMI 40-49.9 (morbid obesity) ([place]); Supervision of normal first pregnancy, antepartum; and Pregnancy with history of infertility on her problem list.  Patient reports vaginal discharge after intercourse.  Contractions: Not present. Vag. Bleeding: None.  Movement: Present. Denies leaking of fluid.   The following portions of the patient's history were reviewed and updated as appropriate: allergies, current medications, past family history, past medical history, past social history, past surgical history and problem list. Problem list updated.  Objective:   Vitals:   08/07/16 0854  BP: 115/74  Pulse: 79  Weight: 239 lb (108.4 kg)    Fetal Status:     Movement: Present     General:  Alert, oriented and cooperative. Patient is in no acute distress.  Skin: Skin is warm and dry. No rash noted.   Cardiovascular: Normal heart rate noted  Respiratory: Normal respiratory effort, no problems with respiration noted  Abdomen: Soft, gravid, appropriate for gestational age. Pain/Pressure: Absent     Pelvic:  Cervical exam deferred        Extremities: Normal range of motion.  Edema: None  Mental Status: Normal mood and affect. Normal behavior. Normal judgment and thought content.   Urinalysis:      Assessment and Plan:  Pregnancy: G1P0 at [redacted]w[redacted]d  1. Supervision of normal first pregnancy, antepartum Rx - CBC - HIV antibody - RPR - Glucose Tolerance, 2 Hours w/1 Hour  Preterm labor symptoms and general obstetric precautions including but not limited to vaginal bleeding, contractions, leaking of fluid and fetal movement were reviewed in detail with the patient. Please refer to After Visit Summary for other counseling recommendations.  F/U in 2 weeks   Shelly Bombard,  MDPatient ID: [name], female   DOB: May 21, 1986, 31 y.o.   MRN: [id number]

## 2016-08-08 LAB — HIV ANTIBODY (ROUTINE TESTING W REFLEX): HIV SCREEN 4TH GENERATION: NONREACTIVE

## 2016-08-08 LAB — CBC
HEMOGLOBIN: 12 g/dL (ref 11.1–15.9)
Hematocrit: 36 % (ref 34.0–46.6)
MCH: 27.7 pg (ref 26.6–33.0)
MCHC: 33.3 g/dL (ref 31.5–35.7)
MCV: 83 fL (ref 79–97)
Platelets: 364 10*3/uL (ref 150–379)
RBC: 4.33 x10E6/uL (ref 3.77–5.28)
RDW: 14.4 % (ref 12.3–15.4)
WBC: 10 10*3/uL (ref 3.4–10.8)

## 2016-08-08 LAB — GLUCOSE TOLERANCE, 2 HOURS W/ 1HR
Glucose, 1 hour: 130 mg/dL (ref 65–179)
Glucose, 2 hour: 89 mg/dL (ref 65–152)
Glucose, Fasting: 80 mg/dL (ref 65–91)

## 2016-08-08 LAB — RPR: RPR: NONREACTIVE

## 2016-08-21 ENCOUNTER — Ambulatory Visit (INDEPENDENT_AMBULATORY_CARE_PROVIDER_SITE_OTHER): Payer: Medicaid Other | Admitting: Obstetrics & Gynecology

## 2016-08-21 ENCOUNTER — Encounter: Payer: Medicaid Other | Admitting: Obstetrics & Gynecology

## 2016-08-21 ENCOUNTER — Encounter: Payer: Self-pay | Admitting: Obstetrics

## 2016-08-21 DIAGNOSIS — Z3400 Encounter for supervision of normal first pregnancy, unspecified trimester: Secondary | ICD-10-CM

## 2016-08-21 DIAGNOSIS — Z23 Encounter for immunization: Secondary | ICD-10-CM | POA: Diagnosis not present

## 2016-08-21 DIAGNOSIS — Z3403 Encounter for supervision of normal first pregnancy, third trimester: Secondary | ICD-10-CM

## 2016-08-21 NOTE — Progress Notes (Signed)
   PRENATAL VISIT NOTE  Subjective:  Ashley Costa is a 31 y.o. G1P0 at [redacted]w[redacted]d being seen today for ongoing prenatal care.  She is currently monitored for the following issues for this low-risk pregnancy and has Tobacco abuse; Obesity, Class III, BMI 40-49.9 (morbid obesity) ([place]); Supervision of normal first pregnancy, antepartum; and Pregnancy with history of infertility on her problem list.  Patient reports no complaints.  Contractions: Not present. Vag. Bleeding: None.  Movement: Present. Denies leaking of fluid.   The following portions of the patient's history were reviewed and updated as appropriate: allergies, current medications, past family history, past medical history, past social history, past surgical history and problem list. Problem list updated.  Objective:   Vitals:   08/21/16 0853  BP: 112/67  Pulse: 82  Weight: 239 lb (108.4 kg)    Fetal Status:     Movement: Present     General:  Alert, oriented and cooperative. Patient is in no acute distress.  Skin: Skin is warm and dry. No rash noted.   Cardiovascular: Normal heart rate noted  Respiratory: Normal respiratory effort, no problems with respiration noted  Abdomen: Soft, gravid, appropriate for gestational age. Pain/Pressure: Absent     Pelvic:  Cervical exam deferred        Extremities: Normal range of motion.  Edema: None  Mental Status: Normal mood and affect. Normal behavior. Normal judgment and thought content.   Assessment and Plan:  Pregnancy: G1P0 at [redacted]w[redacted]d  1. Supervision of normal first pregnancy, antepartum Doing well  Preterm labor symptoms and general obstetric precautions including but not limited to vaginal bleeding, contractions, leaking of fluid and fetal movement were reviewed in detail with the patient. Please refer to After Visit Summary for other counseling recommendations.  Return in about 2 weeks (around 09/04/2016).   Woodroe Mode, MD

## 2016-08-21 NOTE — Addendum Note (Signed)
Addended by: Tamela Oddi on: 08/21/2016 09:35 AM   Modules accepted: Orders

## 2016-08-21 NOTE — Patient Instructions (Signed)
Third Trimester of Pregnancy The third trimester is from week 28 through week 40 (months 7 through 9). The third trimester is a time when the unborn baby (fetus) is growing rapidly. At the end of the ninth month, the fetus is about 20 inches in length and weighs 6-10 pounds. Body changes during your third trimester Your body will continue to go through many changes during pregnancy. The changes vary from woman to woman. During the third trimester:  Your weight will continue to increase. You can expect to gain 25-35 pounds (11-16 kg) by the end of the pregnancy.  You may begin to get stretch marks on your hips, abdomen, and breasts.  You may urinate more often because the fetus is moving lower into your pelvis and pressing on your bladder.  You may develop or continue to have heartburn. This is caused by increased hormones that slow down muscles in the digestive tract.  You may develop or continue to have constipation because increased hormones slow digestion and cause the muscles that push waste through your intestines to relax.  You may develop hemorrhoids. These are swollen veins (varicose veins) in the rectum that can itch or be painful.  You may develop swollen, bulging veins (varicose veins) in your legs.  You may have increased body aches in the pelvis, back, or thighs. This is due to weight gain and increased hormones that are relaxing your joints.  You may have changes in your hair. These can include thickening of your hair, rapid growth, and changes in texture. Some women also have hair loss during or after pregnancy, or hair that feels dry or thin. Your hair will most likely return to normal after your baby is born.  Your breasts will continue to grow and they will continue to become tender. A yellow fluid (colostrum) may leak from your breasts. This is the first milk you are producing for your baby.  Your belly button may stick out.  You may notice more swelling in your hands,  face, or ankles.  You may have increased tingling or numbness in your hands, arms, and legs. The skin on your belly may also feel numb.  You may feel short of breath because of your expanding uterus.  You may have more problems sleeping. This can be caused by the size of your belly, increased need to urinate, and an increase in your body's metabolism.  You may notice the fetus "dropping," or moving lower in your abdomen (lightening).  You may have increased vaginal discharge.  You may notice your joints feel loose and you may have pain around your pelvic bone.  What to expect at prenatal visits You will have prenatal exams every 2 weeks until week 36. Then you will have weekly prenatal exams. During a routine prenatal visit:  You will be weighed to make sure you and the baby are growing normally.  Your blood pressure will be taken.  Your abdomen will be measured to track your baby's growth.  The fetal heartbeat will be listened to.  Any test results from the previous visit will be discussed.  You may have a cervical check near your due date to see if your cervix has softened or thinned (effaced).  You will be tested for Group B streptococcus. This happens between 35 and 37 weeks.  Your health care provider may ask you:  What your birth plan is.  How you are feeling.  If you are feeling the baby move.  If you have had   any abnormal symptoms, such as leaking fluid, bleeding, severe headaches, or abdominal cramping.  If you are using any tobacco products, including cigarettes, chewing tobacco, and electronic cigarettes.  If you have any questions.  Other tests or screenings that may be performed during your third trimester include:  Blood tests that check for low iron levels (anemia).  Fetal testing to check the health, activity level, and growth of the fetus. Testing is done if you have certain medical conditions or if there are problems during the  pregnancy.  Nonstress test (NST). This test checks the health of your baby to make sure there are no signs of problems, such as the baby not getting enough oxygen. During this test, a belt is placed around your belly. The baby is made to move, and its heart rate is monitored during movement.  What is false labor? False labor is a condition in which you feel small, irregular tightenings of the muscles in the womb (contractions) that usually go away with rest, changing position, or drinking water. These are called Braxton Hicks contractions. Contractions may last for hours, days, or even weeks before true labor sets in. If contractions come at regular intervals, become more frequent, increase in intensity, or become painful, you should see your health care provider. What are the signs of labor?  Abdominal cramps.  Regular contractions that start at 10 minutes apart and become stronger and more frequent with time.  Contractions that start on the top of the uterus and spread down to the lower abdomen and back.  Increased pelvic pressure and dull back pain.  A watery or bloody mucus discharge that comes from the vagina.  Leaking of amniotic fluid. This is also known as your "water breaking." It could be a slow trickle or a gush. Let your health care provider know if it has a color or strange odor. If you have any of these signs, call your health care provider right away, even if it is before your due date. Follow these instructions at home: Medicines  Follow your health care provider's instructions regarding medicine use. Specific medicines may be either safe or unsafe to take during pregnancy.  Take a prenatal vitamin that contains at least 600 micrograms (mcg) of folic acid.  If you develop constipation, try taking a stool softener if your health care provider approves. Eating and drinking  Eat a balanced diet that includes fresh fruits and vegetables, whole grains, good sources of protein  such as meat, eggs, or tofu, and low-fat dairy. Your health care provider will help you determine the amount of weight gain that is right for you.  Avoid raw meat and uncooked cheese. These carry germs that can cause birth defects in the baby.  If you have low calcium intake from food, talk to your health care provider about whether you should take a daily calcium supplement.  Eat four or five small meals rather than three large meals a day.  Limit foods that are high in fat and processed sugars, such as fried and sweet foods.  To prevent constipation: ? Drink enough fluid to keep your urine clear or pale yellow. ? Eat foods that are high in fiber, such as fresh fruits and vegetables, whole grains, and beans. Activity  Exercise only as directed by your health care provider. Most women can continue their usual exercise routine during pregnancy. Try to exercise for 30 minutes at least 5 days a week. Stop exercising if you experience uterine contractions.  Avoid heavy   lifting.  Do not exercise in extreme heat or humidity, or at high altitudes.  Wear low-heel, comfortable shoes.  Practice good posture.  You may continue to have sex unless your health care provider tells you otherwise. Relieving pain and discomfort  Take frequent breaks and rest with your legs elevated if you have leg cramps or low back pain.  Take warm sitz baths to soothe any pain or discomfort caused by hemorrhoids. Use hemorrhoid cream if your health care provider approves.  Wear a good support bra to prevent discomfort from breast tenderness.  If you develop varicose veins: ? Wear support pantyhose or compression stockings as told by your healthcare provider. ? Elevate your feet for 15 minutes, 3-4 times a day. Prenatal care  Write down your questions. Take them to your prenatal visits.  Keep all your prenatal visits as told by your health care provider. This is important. Safety  Wear your seat belt at  all times when driving.  Make a list of emergency phone numbers, including numbers for family, friends, the hospital, and police and fire departments. General instructions  Avoid cat litter boxes and soil used by cats. These carry germs that can cause birth defects in the baby. If you have a cat, ask someone to clean the litter box for you.  Do not travel far distances unless it is absolutely necessary and only with the approval of your health care provider.  Do not use hot tubs, steam rooms, or saunas.  Do not drink alcohol.  Do not use any products that contain nicotine or tobacco, such as cigarettes and e-cigarettes. If you need help quitting, ask your health care provider.  Do not use any medicinal herbs or unprescribed drugs. These chemicals affect the formation and growth of the baby.  Do not douche or use tampons or scented sanitary pads.  Do not cross your legs for long periods of time.  To prepare for the arrival of your baby: ? Take prenatal classes to understand, practice, and ask questions about labor and delivery. ? Make a trial run to the hospital. ? Visit the hospital and tour the maternity area. ? Arrange for maternity or paternity leave through employers. ? Arrange for family and friends to take care of pets while you are in the hospital. ? Purchase a rear-facing car seat and make sure you know how to install it in your car. ? Pack your hospital bag. ? Prepare the baby's nursery. Make sure to remove all pillows and stuffed animals from the baby's crib to prevent suffocation.  Visit your dentist if you have not gone during your pregnancy. Use a soft toothbrush to brush your teeth and be gentle when you floss. Contact a health care provider if:  You are unsure if you are in labor or if your water has broken.  You become dizzy.  You have mild pelvic cramps, pelvic pressure, or nagging pain in your abdominal area.  You have lower back pain.  You have persistent  nausea, vomiting, or diarrhea.  You have an unusual or bad smelling vaginal discharge.  You have pain when you urinate. Get help right away if:  Your water breaks before 37 weeks.  You have regular contractions less than 5 minutes apart before 37 weeks.  You have a fever.  You are leaking fluid from your vagina.  You have spotting or bleeding from your vagina.  You have severe abdominal pain or cramping.  You have rapid weight loss or weight gain.    You have shortness of breath with chest pain.  You notice sudden or extreme swelling of your face, hands, ankles, feet, or legs.  Your baby makes fewer than 10 movements in 2 hours.  You have severe headaches that do not go away when you take medicine.  You have vision changes. Summary  The third trimester is from week 28 through week 40, months 7 through 9. The third trimester is a time when the unborn baby (fetus) is growing rapidly.  During the third trimester, your discomfort may increase as you and your baby continue to gain weight. You may have abdominal, leg, and back pain, sleeping problems, and an increased need to urinate.  During the third trimester your breasts will keep growing and they will continue to become tender. A yellow fluid (colostrum) may leak from your breasts. This is the first milk you are producing for your baby.  False labor is a condition in which you feel small, irregular tightenings of the muscles in the womb (contractions) that eventually go away. These are called Braxton Hicks contractions. Contractions may last for hours, days, or even weeks before true labor sets in.  Signs of labor can include: abdominal cramps; regular contractions that start at 10 minutes apart and become stronger and more frequent with time; watery or bloody mucus discharge that comes from the vagina; increased pelvic pressure and dull back pain; and leaking of amniotic fluid. This information is not intended to replace advice  given to you by your health care provider. Make sure you discuss any questions you have with your health care provider. Document Released: 05/15/2001 Document Revised: 10/27/2015 Document Reviewed: 07/22/2012 Elsevier Interactive Patient Education  2017 [employer]  

## 2016-09-04 ENCOUNTER — Ambulatory Visit (INDEPENDENT_AMBULATORY_CARE_PROVIDER_SITE_OTHER): Payer: Medicaid Other | Admitting: Obstetrics and Gynecology

## 2016-09-04 VITALS — BP 114/76 | HR 86 | Wt 240.0 lb

## 2016-09-04 DIAGNOSIS — Z3403 Encounter for supervision of normal first pregnancy, third trimester: Secondary | ICD-10-CM

## 2016-09-04 DIAGNOSIS — O99210 Obesity complicating pregnancy, unspecified trimester: Secondary | ICD-10-CM

## 2016-09-04 DIAGNOSIS — O99213 Obesity complicating pregnancy, third trimester: Secondary | ICD-10-CM

## 2016-09-04 DIAGNOSIS — Z3400 Encounter for supervision of normal first pregnancy, unspecified trimester: Secondary | ICD-10-CM

## 2016-09-04 DIAGNOSIS — E669 Obesity, unspecified: Secondary | ICD-10-CM

## 2016-09-04 NOTE — Progress Notes (Signed)
Prenatal Visit Note Date: 09/04/2016 Clinic: [facility]  Subjective:  Ashley Costa is a 31 y.o. G1P0 at [redacted]w[redacted]d being seen today for ongoing prenatal care.  She is currently monitored for the following issues for this low-risk pregnancy and has Tobacco abuse; Obesity, Class III, BMI 40-49.9 (morbid obesity) ([place]); Supervision of normal first pregnancy, antepartum; and Pregnancy with history of infertility on her problem list.  Patient reports no complaints.   Contractions: Not present. Vag. Bleeding: None.  Movement: Present. Denies leaking of fluid.   The following portions of the patient's history were reviewed and updated as appropriate: allergies, current medications, past family history, past medical history, past social history, past surgical history and problem list. Problem list updated.  Objective:   Vitals:   09/04/16 1024  BP: 114/76  Pulse: 86  Weight: 240 lb (108.9 kg)    Fetal Status: Fetal Heart Rate (bpm): 150 Fundal Height: 33 cm Movement: Present  Presentation: Vertex  General:  Alert, oriented and cooperative. Patient is in no acute distress.  Skin: Skin is warm and dry. No rash noted.   Cardiovascular: Normal heart rate noted  Respiratory: Normal respiratory effort, no problems with respiration noted  Abdomen: Soft, gravid, appropriate for gestational age. Pain/Pressure: Absent     Pelvic:  Cervical exam deferred        Extremities: Normal range of motion.  Edema: None  Mental Status: Normal mood and affect. Normal behavior. Normal judgment and thought content.   Urinalysis:      Assessment and Plan:  Pregnancy: G1P0 at [redacted]w[redacted]d  1. Supervision of normal first pregnancy, antepartum Routine care.  Preterm labor symptoms and general obstetric precautions including but not limited to vaginal bleeding, contractions, leaking of fluid and fetal movement were reviewed in detail with the patient. Please refer to After Visit Summary for other  counseling recommendations.  Return in about 2 weeks (around 09/18/2016).   Aletha Halim, MD

## 2016-09-04 NOTE — Progress Notes (Deleted)
[phone]  

## 2016-09-04 NOTE — Progress Notes (Signed)
Patient reports good fetal movement, denies pain/contractions.

## 2016-09-18 ENCOUNTER — Encounter: Payer: Self-pay | Admitting: Obstetrics

## 2016-09-18 ENCOUNTER — Ambulatory Visit (INDEPENDENT_AMBULATORY_CARE_PROVIDER_SITE_OTHER): Payer: Medicaid Other | Admitting: Obstetrics

## 2016-09-18 VITALS — BP 138/79 | HR 87 | Wt 247.0 lb

## 2016-09-18 DIAGNOSIS — Z3483 Encounter for supervision of other normal pregnancy, third trimester: Secondary | ICD-10-CM

## 2016-09-18 DIAGNOSIS — Z3480 Encounter for supervision of other normal pregnancy, unspecified trimester: Secondary | ICD-10-CM

## 2016-09-18 NOTE — Progress Notes (Signed)
Subjective:  Ashley Costa is a 31 y.o. G1P0 at [redacted]w[redacted]d being seen today for ongoing prenatal care.  She is currently monitored for the following issues for this low-risk pregnancy and has Tobacco abuse; Obesity, Class III, BMI 40-49.9 (morbid obesity) ([place]); Supervision of normal first pregnancy, antepartum; Pregnancy with history of infertility; and Obesity in pregnancy, antepartum, third trimester on her problem list.  Patient reports no complaints.  Contractions: Not present. Vag. Bleeding: None.  Movement: Present. Denies leaking of fluid.   The following portions of the patient's history were reviewed and updated as appropriate: allergies, current medications, past family history, past medical history, past social history, past surgical history and problem list. Problem list updated.  Objective:   Vitals:   09/18/16 1625  BP: 138/79  Pulse: 87  Weight: 247 lb (112 kg)    Fetal Status: Fetal Heart Rate (bpm): 150   Movement: Present     General:  Alert, oriented and cooperative. Patient is in no acute distress.  Skin: Skin is warm and dry. No rash noted.   Cardiovascular: Normal heart rate noted  Respiratory: Normal respiratory effort, no problems with respiration noted  Abdomen: Soft, gravid, appropriate for gestational age. Pain/Pressure: Absent     Pelvic:  Cervical exam deferred        Extremities: Normal range of motion.     Mental Status: Normal mood and affect. Normal behavior. Normal judgment and thought content.   Urinalysis:      Assessment and Plan:  Pregnancy: G1P0 at [redacted]w[redacted]d  There are no diagnoses linked to this encounter. Preterm labor symptoms and general obstetric precautions including but not limited to vaginal bleeding, contractions, leaking of fluid and fetal movement were reviewed in detail with the patient. Please refer to After Visit Summary for other counseling recommendations.  Return in 1 week (on 09/25/2016).   Shelly Bombard, MDPatient ID:  Ashley Costa, female   DOB: 1986/05/25, 31 y.o.   MRN: [id number]

## 2016-09-25 ENCOUNTER — Encounter: Payer: Self-pay | Admitting: Obstetrics

## 2016-09-25 ENCOUNTER — Ambulatory Visit (INDEPENDENT_AMBULATORY_CARE_PROVIDER_SITE_OTHER): Payer: Medicaid Other | Admitting: Obstetrics

## 2016-09-25 ENCOUNTER — Other Ambulatory Visit (HOSPITAL_COMMUNITY)
Admission: RE | Admit: 2016-09-25 | Discharge: 2016-09-25 | Disposition: A | Discharge status: Home / Self Care | Payer: Medicaid Other | Source: Ambulatory Visit | Attending: Obstetrics | Admitting: Obstetrics

## 2016-09-25 VITALS — BP 103/67 | HR 92 | Wt 248.0 lb

## 2016-09-25 DIAGNOSIS — O99213 Obesity complicating pregnancy, third trimester: Secondary | ICD-10-CM

## 2016-09-25 DIAGNOSIS — Z3490 Encounter for supervision of normal pregnancy, unspecified, unspecified trimester: Secondary | ICD-10-CM

## 2016-09-25 DIAGNOSIS — Z3403 Encounter for supervision of normal first pregnancy, third trimester: Secondary | ICD-10-CM | POA: Diagnosis present

## 2016-09-25 DIAGNOSIS — E669 Obesity, unspecified: Secondary | ICD-10-CM

## 2016-09-25 DIAGNOSIS — Z3400 Encounter for supervision of normal first pregnancy, unspecified trimester: Secondary | ICD-10-CM

## 2016-09-25 LAB — OB RESULTS CONSOLE GC/CHLAMYDIA: Gonorrhea: NEGATIVE

## 2016-09-25 NOTE — Progress Notes (Signed)
Subjective:  Ashley Costa is a 31 y.o. G1P0 at [redacted]w[redacted]d being seen today for ongoing prenatal care.  She is currently monitored for the following issues for this low-risk pregnancy and has Tobacco abuse; Obesity, Class III, BMI 40-49.9 (morbid obesity) ([place]); Supervision of normal first pregnancy, antepartum; Pregnancy with history of infertility; and Obesity in pregnancy, antepartum, third trimester on her problem list.  Patient reports no complaints.  Contractions: Not present. Vag. Bleeding: None.  Movement: Present. Denies leaking of fluid.   The following portions of the patient's history were reviewed and updated as appropriate: allergies, current medications, past family history, past medical history, past social history, past surgical history and problem list. Problem list updated.  Objective:   Vitals:   09/25/16 1437  BP: 103/67  Pulse: 92  Weight: 248 lb (112.5 kg)    Fetal Status: Fetal Heart Rate (bpm): 150   Movement: Present     General:  Alert, oriented and cooperative. Patient is in no acute distress.  Skin: Skin is warm and dry. No rash noted.   Cardiovascular: Normal heart rate noted  Respiratory: Normal respiratory effort, no problems with respiration noted  Abdomen: Soft, gravid, appropriate for gestational age. Pain/Pressure: Present     Pelvic:  Cervical exam deferred        Extremities: Normal range of motion.  Edema: None  Mental Status: Normal mood and affect. Normal behavior. Normal judgment and thought content.   Urinalysis:      Assessment and Plan:  Pregnancy: G1P0 at [redacted]w[redacted]d  1. Prenatal care, antepartum Rx: - Culture, beta strep (group b only) - GC/Chlamydia probe amp ([place])not at [facility]  2. Supervision of normal first pregnancy, antepartum   3. Obesity in pregnancy, antepartum, third trimester   Preterm labor symptoms and general obstetric precautions including but not limited to vaginal bleeding, contractions, leaking of fluid and fetal  movement were reviewed in detail with the patient. Please refer to After Visit Summary for other counseling recommendations.  Return in about 1 week (around 10/02/2016) for ROB.   Shelly Bombard, MD

## 2016-09-26 LAB — GC/CHLAMYDIA PROBE AMP (~~LOC~~) NOT AT ARMC
CHLAMYDIA, DNA PROBE: NEGATIVE
Neisseria Gonorrhea: NEGATIVE

## 2016-09-26 LAB — OB RESULTS CONSOLE GBS: GBS: POSITIVE

## 2016-09-29 LAB — CULTURE, BETA STREP (GROUP B ONLY): Strep Gp B Culture: POSITIVE — AB

## 2016-10-05 ENCOUNTER — Ambulatory Visit (INDEPENDENT_AMBULATORY_CARE_PROVIDER_SITE_OTHER): Payer: Medicaid Other | Admitting: Obstetrics

## 2016-10-05 ENCOUNTER — Encounter: Payer: Self-pay | Admitting: Obstetrics

## 2016-10-05 DIAGNOSIS — Z3400 Encounter for supervision of normal first pregnancy, unspecified trimester: Secondary | ICD-10-CM

## 2016-10-05 DIAGNOSIS — Z3483 Encounter for supervision of other normal pregnancy, third trimester: Secondary | ICD-10-CM

## 2016-10-05 NOTE — Progress Notes (Signed)
Subjective:  Ashley Costa is a 31 y.o. G1P0 at [redacted]w[redacted]d being seen today for ongoing prenatal care.  She is currently monitored for the following issues for this low-risk pregnancy and has Tobacco abuse; Obesity, Class III, BMI 40-49.9 (morbid obesity) ([place]); Supervision of normal first pregnancy, antepartum; Pregnancy with history of infertility; and Obesity in pregnancy, antepartum, third trimester on her problem list.  Patient reports no complaints.  Contractions: Not present. Vag. Bleeding: None.  Movement: Present. Denies leaking of fluid.   The following portions of the patient's history were reviewed and updated as appropriate: allergies, current medications, past family history, past medical history, past social history, past surgical history and problem list. Problem list updated.  Objective:   Vitals:   10/05/16 0904  BP: 121/80  Pulse: 66  Weight: 247 lb 11.2 oz (112.4 kg)    Fetal Status: Fetal Heart Rate (bpm): 150   Movement: Present     General:  Alert, oriented and cooperative. Patient is in no acute distress.  Skin: Skin is warm and dry. No rash noted.   Cardiovascular: Normal heart rate noted  Respiratory: Normal respiratory effort, no problems with respiration noted  Abdomen: Soft, gravid, appropriate for gestational age. Pain/Pressure: Absent     Pelvic:  Cervical exam deferred        Extremities: Normal range of motion.  Edema: None  Mental Status: Normal mood and affect. Normal behavior. Normal judgment and thought content.   Urinalysis:      Assessment and Plan:  Pregnancy: G1P0 at [redacted]w[redacted]d  1. Supervision of normal first pregnancy, antepartum   Term labor symptoms and general obstetric precautions including but not limited to vaginal bleeding, contractions, leaking of fluid and fetal movement were reviewed in detail with the patient. Please refer to After Visit Summary for other counseling recommendations.  Return in 1 week (on 10/12/2016).   Shelly Bombard, MDPatient ID: [name], female   DOB: 1985-10-21, 31 y.o.   MRN: [id number]

## 2016-10-05 NOTE — Progress Notes (Signed)
Patient reports good fetal movement, denies pain/contractions.

## 2016-10-10 ENCOUNTER — Ambulatory Visit (INDEPENDENT_AMBULATORY_CARE_PROVIDER_SITE_OTHER): Payer: Medicaid Other | Admitting: Obstetrics

## 2016-10-10 ENCOUNTER — Encounter: Payer: Self-pay | Admitting: Obstetrics

## 2016-10-10 VITALS — BP 112/78 | HR 78 | Wt 244.0 lb

## 2016-10-10 DIAGNOSIS — Z3480 Encounter for supervision of other normal pregnancy, unspecified trimester: Secondary | ICD-10-CM

## 2016-10-10 DIAGNOSIS — Z3403 Encounter for supervision of normal first pregnancy, third trimester: Secondary | ICD-10-CM

## 2016-10-10 DIAGNOSIS — Z3400 Encounter for supervision of normal first pregnancy, unspecified trimester: Secondary | ICD-10-CM

## 2016-10-10 NOTE — Progress Notes (Signed)
Subjective:  Ashley Costa is a 30 y.o. G1P0 at [redacted]w[redacted]d being seen today for ongoing prenatal care.  She is currently monitored for the following issues for this low-risk pregnancy and has Tobacco abuse; Obesity, Class III, BMI 40-49.9 (morbid obesity) ([place]); Supervision of normal first pregnancy, antepartum; Pregnancy with history of infertility; and Obesity in pregnancy, antepartum, third trimester on her problem list.  Patient reports no complaints.  Contractions: Irregular. Vag. Bleeding: None.  Movement: Present. Denies leaking of fluid.   The following portions of the patient's history were reviewed and updated as appropriate: allergies, current medications, past family history, past medical history, past social history, past surgical history and problem list. Problem list updated.  Objective:   Vitals:   10/10/16 1356  BP: 112/78  Pulse: 78  Weight: 244 lb (110.7 kg)    Fetal Status: Fetal Heart Rate (bpm): 150   Movement: Present     General:  Alert, oriented and cooperative. Patient is in no acute distress.  Skin: Skin is warm and dry. No rash noted.   Cardiovascular: Normal heart rate noted  Respiratory: Normal respiratory effort, no problems with respiration noted  Abdomen: Soft, gravid, appropriate for gestational age. Pain/Pressure: Present     Pelvic:  Cervical exam deferred        Extremities: Normal range of motion.  Edema: None  Mental Status: Normal mood and affect. Normal behavior. Normal judgment and thought content.   Urinalysis:      Assessment and Plan:  Pregnancy: G1P0 at [redacted]w[redacted]d  There are no diagnoses linked to this encounter. Term labor symptoms and general obstetric precautions including but not limited to vaginal bleeding, contractions, leaking of fluid and fetal movement were reviewed in detail with the patient. Please refer to After Visit Summary for other counseling recommendations.  Return in 1 week (on 10/17/2016) for ROB.   Shelly Bombard,  MDPatient ID: Ashley Costa, female   DOB: 08-01-1985, 31 y.o.   MRN: [id number]

## 2016-10-10 NOTE — Progress Notes (Signed)
ROB c/o baby's growth

## 2016-10-18 ENCOUNTER — Encounter: Payer: Self-pay | Admitting: Obstetrics

## 2016-10-18 ENCOUNTER — Ambulatory Visit (INDEPENDENT_AMBULATORY_CARE_PROVIDER_SITE_OTHER): Payer: Medicaid Other | Admitting: Obstetrics

## 2016-10-18 VITALS — BP 122/74 | HR 88 | Wt 245.8 lb

## 2016-10-18 DIAGNOSIS — Z3480 Encounter for supervision of other normal pregnancy, unspecified trimester: Secondary | ICD-10-CM

## 2016-10-18 DIAGNOSIS — Z3483 Encounter for supervision of other normal pregnancy, third trimester: Secondary | ICD-10-CM

## 2016-10-18 NOTE — Progress Notes (Signed)
Subjective:  Lya Holben is a 31 y.o. G1P0 at [redacted]w[redacted]d being seen today for ongoing prenatal care.  She is currently monitored for the following issues for this low-risk pregnancy and has Tobacco abuse; Obesity, Class III, BMI 40-49.9 (morbid obesity) ([place]); Supervision of normal first pregnancy, antepartum; Pregnancy with history of infertility; and Obesity in pregnancy, antepartum, third trimester on her problem list.  Patient reports no complaints.  Contractions: Irregular. Vag. Bleeding: None.  Movement: Present. Denies leaking of fluid.   The following portions of the patient's history were reviewed and updated as appropriate: allergies, current medications, past family history, past medical history, past social history, past surgical history and problem list. Problem list updated.  Objective:   Vitals:   10/18/16 1424  BP: 122/74  Pulse: 88  Weight: 245 lb 12.8 oz (111.5 kg)    Fetal Status: Fetal Heart Rate (bpm): 150   Movement: Present     General:  Alert, oriented and cooperative. Patient is in no acute distress.  Skin: Skin is warm and dry. No rash noted.   Cardiovascular: Normal heart rate noted  Respiratory: Normal respiratory effort, no problems with respiration noted  Abdomen: Soft, gravid, appropriate for gestational age. Pain/Pressure: Present     Pelvic:  Cervical exam deferred        Extremities: Normal range of motion.  Edema: None  Mental Status: Normal mood and affect. Normal behavior. Normal judgment and thought content.   Urinalysis:      Assessment and Plan:  Pregnancy: G1P0 at [redacted]w[redacted]d  There are no diagnoses linked to this encounter. Term labor symptoms and general obstetric precautions including but not limited to vaginal bleeding, contractions, leaking of fluid and fetal movement were reviewed in detail with the patient. Please refer to After Visit Summary for other counseling recommendations.  Return in about 1 week (around 10/25/2016) for  [place].   Shelly Bombard, MDPatient ID: [name], female   DOB: January 02, 1986, 31 y.o.   MRN: [id number]

## 2016-10-25 ENCOUNTER — Encounter: Payer: Self-pay | Admitting: Obstetrics

## 2016-10-25 ENCOUNTER — Ambulatory Visit (INDEPENDENT_AMBULATORY_CARE_PROVIDER_SITE_OTHER): Payer: Medicaid Other | Admitting: Obstetrics

## 2016-10-25 VITALS — BP 111/79 | HR 78 | Wt 245.7 lb

## 2016-10-25 DIAGNOSIS — Z3480 Encounter for supervision of other normal pregnancy, unspecified trimester: Secondary | ICD-10-CM

## 2016-10-25 DIAGNOSIS — Z3483 Encounter for supervision of other normal pregnancy, third trimester: Secondary | ICD-10-CM

## 2016-10-25 DIAGNOSIS — Z3400 Encounter for supervision of normal first pregnancy, unspecified trimester: Secondary | ICD-10-CM

## 2016-10-25 NOTE — Progress Notes (Signed)
Subjective:  Ashley Costa is a 31 y.o. G1P0 at [redacted]w[redacted]d being seen today for ongoing prenatal care.  She is currently monitored for the following issues for this low-risk pregnancy and has Tobacco abuse; Obesity, Class III, BMI 40-49.9 (morbid obesity) ([place]); Supervision of normal first pregnancy, antepartum; Pregnancy with history of infertility; and Obesity in pregnancy, antepartum, third trimester on her problem list.  Patient reports occasional contractions.  Contractions: Irregular. Vag. Bleeding: None.  Movement: Present. Denies leaking of fluid.   The following portions of the patient's history were reviewed and updated as appropriate: allergies, current medications, past family history, past medical history, past social history, past surgical history and problem list. Problem list updated.  Objective:   Vitals:   10/25/16 1450  BP: 111/79  Pulse: 78  Weight: 245 lb 11.2 oz (111.4 kg)    Fetal Status: Fetal Heart Rate (bpm): 150   Movement: Present     General:  Alert, oriented and cooperative. Patient is in no acute distress.  Skin: Skin is warm and dry. No rash noted.   Cardiovascular: Normal heart rate noted  Respiratory: Normal respiratory effort, no problems with respiration noted  Abdomen: Soft, gravid, appropriate for gestational age. Pain/Pressure: Present     Pelvic:  Cervical exam performed      Cvx:  FT / 50% / -3 / Vtx  Extremities: Normal range of motion.  Edema: None  Mental Status: Normal mood and affect. Normal behavior. Normal judgment and thought content.   Urinalysis:      Assessment and Plan:  Pregnancy: G1P0 at [redacted]w[redacted]d  There are no diagnoses linked to this encounter. Term labor symptoms and general obstetric precautions including but not limited to vaginal bleeding, contractions, leaking of fluid and fetal movement were reviewed in detail with the patient. Please refer to After Visit Summary for other counseling recommendations.  No Follow-up on  file.   Shelly Bombard, MDPatient ID: Ashley Costa, female   DOB: Nov 27, 1985, 31 y.o.   MRN: [id number]

## 2016-10-25 NOTE — Progress Notes (Signed)
Patient reports good fetal movement and irregular contractions.

## 2016-10-30 ENCOUNTER — Telehealth (HOSPITAL_COMMUNITY): Payer: Self-pay | Admitting: *Deleted

## 2016-10-30 NOTE — Telephone Encounter (Signed)
Preadmission screen  

## 2016-10-31 ENCOUNTER — Encounter (HOSPITAL_COMMUNITY): Payer: Self-pay | Admitting: *Deleted

## 2016-10-31 ENCOUNTER — Telehealth (HOSPITAL_COMMUNITY): Payer: Self-pay | Admitting: *Deleted

## 2016-10-31 NOTE — Telephone Encounter (Signed)
Preadmission screen  

## 2016-11-01 ENCOUNTER — Ambulatory Visit (INDEPENDENT_AMBULATORY_CARE_PROVIDER_SITE_OTHER): Payer: Medicaid Other | Admitting: Obstetrics

## 2016-11-01 ENCOUNTER — Encounter: Payer: Self-pay | Admitting: Obstetrics

## 2016-11-01 ENCOUNTER — Inpatient Hospital Stay (HOSPITAL_COMMUNITY)
Admission: AD | Admit: 2016-11-01 | Discharge: 2016-11-01 | Disposition: A | Discharge status: Home / Self Care | Payer: Medicaid Other | Source: Ambulatory Visit | Attending: Obstetrics and Gynecology | Admitting: Obstetrics and Gynecology

## 2016-11-01 ENCOUNTER — Other Ambulatory Visit: Payer: Self-pay | Admitting: Advanced Practice Midwife

## 2016-11-01 ENCOUNTER — Encounter (HOSPITAL_COMMUNITY): Payer: Self-pay

## 2016-11-01 VITALS — BP 108/72 | HR 106 | Wt 247.3 lb

## 2016-11-01 DIAGNOSIS — Z3A40 40 weeks gestation of pregnancy: Secondary | ICD-10-CM | POA: Insufficient documentation

## 2016-11-01 DIAGNOSIS — Z87891 Personal history of nicotine dependence: Secondary | ICD-10-CM | POA: Insufficient documentation

## 2016-11-01 DIAGNOSIS — O480 Post-term pregnancy: Secondary | ICD-10-CM

## 2016-11-01 DIAGNOSIS — Z3400 Encounter for supervision of normal first pregnancy, unspecified trimester: Secondary | ICD-10-CM

## 2016-11-01 DIAGNOSIS — O368393 Maternal care for abnormalities of the fetal heart rate or rhythm, unspecified trimester, fetus 3: Secondary | ICD-10-CM | POA: Diagnosis not present

## 2016-11-01 DIAGNOSIS — Z3493 Encounter for supervision of normal pregnancy, unspecified, third trimester: Secondary | ICD-10-CM | POA: Insufficient documentation

## 2016-11-01 DIAGNOSIS — O368390 Maternal care for abnormalities of the fetal heart rate or rhythm, unspecified trimester, not applicable or unspecified: Secondary | ICD-10-CM

## 2016-11-01 DIAGNOSIS — Z3689 Encounter for other specified antenatal screening: Secondary | ICD-10-CM

## 2016-11-01 DIAGNOSIS — Z3403 Encounter for supervision of normal first pregnancy, third trimester: Secondary | ICD-10-CM

## 2016-11-01 NOTE — Discharge Instructions (Signed)
Braxton Hicks Contractions °Contractions of the uterus can occur throughout pregnancy, but they are not always a sign that you are in labor. You may have practice contractions called Braxton Hicks contractions. These false labor contractions are sometimes confused with true labor. °What are Braxton Hicks contractions? °Braxton Hicks contractions are tightening movements that occur in the muscles of the uterus before labor. Unlike true labor contractions, these contractions do not result in opening (dilation) and thinning of the cervix. Toward the end of pregnancy (32-34 weeks), Braxton Hicks contractions can happen more often and may become stronger. These contractions are sometimes difficult to tell apart from true labor because they can be very uncomfortable. You should not feel embarrassed if you go to the hospital with false labor. °Sometimes, the only way to tell if you are in true labor is for your health care provider to look for changes in the cervix. The health care provider will do a physical exam and may monitor your contractions. If you are not in true labor, the exam should show that your cervix is not dilating and your water has not broken. °If there are no prenatal problems or other health problems associated with your pregnancy, it is completely safe for you to be sent home with false labor. You may continue to have Braxton Hicks contractions until you go into true labor. °How can I tell the difference between true labor and false labor? °· Differences °? False labor °? Contractions last 30-70 seconds.: Contractions are usually shorter and not as strong as true labor contractions. °? Contractions become very regular.: Contractions are usually irregular. °? Discomfort is usually felt in the top of the uterus, and it spreads to the lower abdomen and low back.: Contractions are often felt in the front of the lower abdomen and in the groin. °? Contractions do not go away with walking.: Contractions may  go away when you walk around or change positions while lying down. °? Contractions usually become more intense and increase in frequency.: Contractions get weaker and are shorter-lasting as time goes on. °? The cervix dilates and gets thinner.: The cervix usually does not dilate or become thin. °Follow these instructions at home: °· Take over-the-counter and prescription medicines only as told by your health care provider. °· Keep up with your usual exercises and follow other instructions from your health care provider. °· Eat and drink lightly if you think you are going into labor. °· If Braxton Hicks contractions are making you uncomfortable: °? Change your position from lying down or resting to walking, or change from walking to resting. °? Sit and rest in a tub of warm water. °? Drink enough fluid to keep your urine clear or pale yellow. Dehydration may cause these contractions. °? Do slow and deep breathing several times an hour. °· Keep all follow-up prenatal visits as told by your health care provider. This is important. °Contact a health care provider if: °· You have a fever. °· You have continuous pain in your abdomen. °Get help right away if: °· Your contractions become stronger, more regular, and closer together. °· You have fluid leaking or gushing from your vagina. °· You pass blood-tinged mucus (bloody show). °· You have bleeding from your vagina. °· You have low back pain that you never had before. °· You feel your baby’s head pushing down and causing pelvic pressure. °· Your baby is not moving inside you as much as it used to. °Summary °· Contractions that occur before labor are   called Braxton Hicks contractions, false labor, or practice contractions. °· Braxton Hicks contractions are usually shorter, weaker, farther apart, and less regular than true labor contractions. True labor contractions usually become progressively stronger and regular and they become more frequent. °· Manage discomfort from  Braxton Hicks contractions by changing position, resting in a warm bath, drinking plenty of water, or practicing deep breathing. °This information is not intended to replace advice given to you by your health care provider. Make sure you discuss any questions you have with your health care provider. °Document Released: 05/21/2005 Document Revised: 04/09/2016 Document Reviewed: 04/09/2016 °Elsevier Interactive Patient Education © 2017 [employer] ° ° °Fetal Movement Counts °Patient Name: ________________________________________________ Patient Due Date: ____________________ °What is a fetal movement count? °A fetal movement count is the number of times that you feel your baby move during a certain amount of time. This may also be called a fetal kick count. A fetal movement count is recommended for every pregnant woman. You may be asked to start counting fetal movements as early as week 28 of your pregnancy. °Pay attention to when your baby is most active. You may notice your baby's sleep and wake cycles. You may also notice things that make your baby move more. You should do a fetal movement count: °· When your baby is normally most active. °· At the same time each day. ° °A good time to count movements is while you are resting, after having something to eat and drink. °How do I count fetal movements? °1. Find a quiet, comfortable area. Sit, or lie down on your side. °2. Write down the date, the start time and stop time, and the number of movements that you felt between those two times. Take this information with you to your health care visits. °3. For 2 hours, count kicks, flutters, swishes, rolls, and jabs. You should feel at least 10 movements during 2 hours. °4. You may stop counting after you have felt 10 movements. °5. If you do not feel 10 movements in 2 hours, have something to eat and drink. Then, keep resting and counting for 1 hour. If you feel at least 4 movements during that hour, you may stop  counting. °Contact a health care provider if: °· You feel fewer than 4 movements in 2 hours. °· Your baby is not moving like he or she usually does. °Date: ____________ Start time: ____________ Stop time: ____________ Movements: ____________ °Date: ____________ Start time: ____________ Stop time: ____________ Movements: ____________ °Date: ____________ Start time: ____________ Stop time: ____________ Movements: ____________ °Date: ____________ Start time: ____________ Stop time: ____________ Movements: ____________ °Date: ____________ Start time: ____________ Stop time: ____________ Movements: ____________ °Date: ____________ Start time: ____________ Stop time: ____________ Movements: ____________ °Date: ____________ Start time: ____________ Stop time: ____________ Movements: ____________ °Date: ____________ Start time: ____________ Stop time: ____________ Movements: ____________ °Date: ____________ Start time: ____________ Stop time: ____________ Movements: ____________ °This information is not intended to replace advice given to you by your health care provider. Make sure you discuss any questions you have with your health care provider. °Document Released: 06/20/2006 Document Revised: 01/18/2016 Document Reviewed: 06/30/2015 °Elsevier Interactive Patient Education © 2018 [employer] Inc[employer] ° °

## 2016-11-01 NOTE — MAU Note (Signed)
Pt sent over from MD office due to variable decels noted by MD on NST. Pt states baby is moving normally today. Pt states she is scheduled for an induction on Sunday. Pt denies bleeding, leaking, and contractions.

## 2016-11-01 NOTE — MAU Provider Note (Signed)
  History     CSN: [id number]  Arrival date and time: 11/01/16 1657   First Provider Initiated Contact with Patient 11/01/16 1756      Chief Complaint  Patient presents with  . Non-stress Test   HPI Ms. Ashley Costa is a 31 y.o. G1P0 at [redacted]w[redacted]d who presents to MAU today from the office for further EFM due to variables noted on NST in the office. The patient denies contractions, vaginal bleeding or LOF. She reports good fetal movement. She is scheduled for induction on Sunday.   OB History    Gravida Para Term Preterm AB Living   1             SAB TAB Ectopic Multiple Live Births                  Past Medical History:  Diagnosis Date  . Endometriosis 2015  . Medical history non-contributory     Past Surgical History:  Procedure Laterality Date  . TONSILECTOMY/ADENOIDECTOMY WITH MYRINGOTOMY    . Wisdom teeth abstracted      Family History  Problem Relation Age of Onset  . Diabetes Father   . Hypertension Father   . Diabetes Mother   . Hypertension Mother     Social History  Substance Use Topics  . Smoking status: Former Smoker    Types: Cigarettes    Quit date: 01/30/2016  . Smokeless tobacco: Never Used  . Alcohol use No    Allergies: No Known Allergies  No prescriptions prior to admission.    Review of Systems  Constitutional: Negative for fever.  Gastrointestinal: Negative for abdominal pain.  Genitourinary: Negative for vaginal bleeding and vaginal discharge.   Physical Exam   Blood pressure 114/71, pulse 91, temperature 98.8 F (37.1 C), temperature source Oral, resp. rate 17, height 5\' 4"  (1.626 m), weight 247 lb 4.8 oz (112.2 kg), last menstrual period 01/22/2016, SpO2 98 %.  Physical Exam  Nursing note and vitals reviewed. Constitutional: She is oriented to person, place, and time. She appears well-developed and well-nourished. No distress.  HENT:  Head: Normocephalic and atraumatic.  Cardiovascular: Normal rate.   Respiratory: Effort  normal.  GI: Soft. She exhibits no distension. There is no tenderness.  Neurological: She is alert and oriented to person, place, and time.  Skin: Skin is warm and dry. No erythema.  Psychiatric: She has a normal mood and affect.    Fetal Monitoring: Baseline: 140 bpm Variability: moderate Accelerations: 15 x 15 Decelerations: one variable noted Contractions: none  MAU Course  Procedures  MDM NST today - reactive Reviewed by Dr. Kennon Rounds. [place] for discharge at this time.   Assessment and Plan  A: SIUP at [redacted]w[redacted]d Reactive NST   P: Discharge home Labor precautions discussed Patient advised to follow-[facility] with [facility] for induction on Sunday or sooner PRN Patient may return to MAU as needed or if her condition were to change or worsen  Kerry Hough, PA-C 11/01/2016, 7:21 PM

## 2016-11-01 NOTE — Progress Notes (Signed)
Patient reports good fetal movement, denies pain. 

## 2016-11-02 ENCOUNTER — Encounter: Payer: Self-pay | Admitting: Obstetrics

## 2016-11-02 NOTE — Progress Notes (Signed)
Subjective:  Ashley Costa is a 31 y.o. G1P0 at [redacted]w[redacted]d being seen today for ongoing prenatal care.  She is currently monitored for the following issues for this low-risk pregnancy and has Tobacco abuse; Obesity, Class III, BMI 40-49.9 (morbid obesity) ([place]); Supervision of normal first pregnancy, antepartum; Pregnancy with history of infertility; and Obesity in pregnancy, antepartum, third trimester on her problem list.  Patient reports no complaints.  Contractions: Irregular. Vag. Bleeding: None.  Movement: Present. Denies leaking of fluid.   The following portions of the patient's history were reviewed and updated as appropriate: allergies, current medications, past family history, past medical history, past social history, past surgical history and problem list. Problem list updated.  Objective:   Vitals:   11/01/16 1539  BP: 108/72  Pulse: (!) 106  Weight: 247 lb 4.8 oz (112.2 kg)    Fetal Status: Fetal Heart Rate (bpm): NST   Movement: Present     General:  Alert, oriented and cooperative. Patient is in no acute distress.  Skin: Skin is warm and dry. No rash noted.   Cardiovascular: Normal heart rate noted  Respiratory: Normal respiratory effort, no problems with respiration noted  Abdomen: Soft, gravid, appropriate for gestational age. Pain/Pressure: Absent     Pelvic:  Cervical exam deferred        Extremities: Normal range of motion.  Edema: None  Mental Status: Normal mood and affect. Normal behavior. Normal judgment and thought content.   Urinalysis:      Assessment and Plan:  Pregnancy: G1P0 at [redacted]w[redacted]d  1. Supervision of normal first pregnancy, antepartum   2. Post-term pregnancy, 40-42 weeks of gestation Rx: - Fetal nonstress test:  Reactive.  Occasional variable.   - Sent to [facility] for further evaluation   Term labor symptoms and general obstetric precautions including but not limited to vaginal bleeding, contractions, leaking of fluid and fetal movement were  reviewed in detail with the patient. Please refer to After Visit Summary for other counseling recommendations.  IOL scheduled.   Shelly Bombard, MD

## 2016-11-04 ENCOUNTER — Encounter (HOSPITAL_COMMUNITY): Payer: Self-pay

## 2016-11-04 ENCOUNTER — Inpatient Hospital Stay (HOSPITAL_COMMUNITY)
Admission: RE | Admit: 2016-11-04 | Discharge: 2016-11-08 | DRG: 765 | Disposition: A | Discharge status: Home / Self Care | Payer: Medicaid Other | Source: Ambulatory Visit | Attending: Obstetrics and Gynecology | Admitting: Obstetrics and Gynecology

## 2016-11-04 VITALS — BP 115/57 | HR 98 | Temp 98.3°F | Resp 19 | Ht 64.0 in | Wt 247.0 lb

## 2016-11-04 DIAGNOSIS — Z6841 Body Mass Index (BMI) 40.0 and over, adult: Secondary | ICD-10-CM | POA: Diagnosis not present

## 2016-11-04 DIAGNOSIS — O99214 Obesity complicating childbirth: Secondary | ICD-10-CM | POA: Diagnosis present

## 2016-11-04 DIAGNOSIS — Z3A41 41 weeks gestation of pregnancy: Secondary | ICD-10-CM

## 2016-11-04 DIAGNOSIS — Z87891 Personal history of nicotine dependence: Secondary | ICD-10-CM

## 2016-11-04 DIAGNOSIS — O99824 Streptococcus B carrier state complicating childbirth: Secondary | ICD-10-CM | POA: Diagnosis present

## 2016-11-04 DIAGNOSIS — O480 Post-term pregnancy: Secondary | ICD-10-CM | POA: Diagnosis present

## 2016-11-04 DIAGNOSIS — Z98891 History of uterine scar from previous surgery: Secondary | ICD-10-CM

## 2016-11-04 HISTORY — DX: Nicotine dependence, unspecified, uncomplicated: F17.200

## 2016-11-04 LAB — CBC
HCT: 33.7 % — ABNORMAL LOW (ref 36.0–46.0)
HEMOGLOBIN: 11.9 g/dL — AB (ref 12.0–15.0)
MCH: 28.5 pg (ref 26.0–34.0)
MCHC: 35.3 g/dL (ref 30.0–36.0)
MCV: 80.6 fL (ref 78.0–100.0)
Platelets: 332 10*3/uL (ref 150–400)
RBC: 4.18 MIL/uL (ref 3.87–5.11)
RDW: 14.2 % (ref 11.5–15.5)
WBC: 8.9 10*3/uL (ref 4.0–10.5)

## 2016-11-04 LAB — TYPE AND SCREEN
ABO/RH(D): O POS
Antibody Screen: NEGATIVE

## 2016-11-04 LAB — ABO/RH: ABO/RH(D): O POS

## 2016-11-04 LAB — RPR: RPR Ser Ql: NONREACTIVE

## 2016-11-04 MED ORDER — MISOPROSTOL 50MCG HALF TABLET
50.0000 ug | ORAL_TABLET | ORAL | Status: DC | PRN
  Administered 2016-11-04 (×2): 50 ug via BUCCAL
  Filled 2016-11-04 (×2): qty 1

## 2016-11-04 MED ORDER — FENTANYL CITRATE (PF) 100 MCG/2ML IJ SOLN
100.0000 ug | INTRAMUSCULAR | Status: DC | PRN
  Administered 2016-11-05 (×2): 100 ug via INTRAVENOUS
  Filled 2016-11-04 (×2): qty 2

## 2016-11-04 MED ORDER — ACETAMINOPHEN 325 MG PO TABS: 650.0000 mg | ORAL_TABLET | ORAL | Status: DC | PRN

## 2016-11-04 MED ORDER — SOD CITRATE-CITRIC ACID 500-334 MG/5ML PO SOLN
30.0000 mL | ORAL | Status: DC | PRN
  Administered 2016-11-05 (×2): 30 mL via ORAL
  Filled 2016-11-04 (×2): qty 15

## 2016-11-04 MED ORDER — TERBUTALINE SULFATE 1 MG/ML IJ SOLN: 0.2500 mg | Freq: Once | INTRAMUSCULAR | Status: DC | PRN

## 2016-11-04 MED ORDER — PENICILLIN G POTASSIUM 5000000 UNITS IJ SOLR
5.0000 10*6.[IU] | Freq: Once | INTRAMUSCULAR | Status: AC
Start: 2016-11-04 — End: 2016-11-04
  Administered 2016-11-04: 5 10*6.[IU] via INTRAVENOUS
  Filled 2016-11-04: qty 5

## 2016-11-04 MED ORDER — ONDANSETRON HCL 4 MG/2ML IJ SOLN
4.0000 mg | Freq: Four times a day (QID) | INTRAMUSCULAR | Status: DC | PRN
  Administered 2016-11-05 – 2016-11-06 (×2): 4 mg via INTRAVENOUS
  Filled 2016-11-04 (×2): qty 2

## 2016-11-04 MED ORDER — LACTATED RINGERS IV SOLN
500.0000 mL | INTRAVENOUS | Status: DC | PRN
  Administered 2016-11-04: 500 mL via INTRAVENOUS

## 2016-11-04 MED ORDER — OXYCODONE-ACETAMINOPHEN 5-325 MG PO TABS: 1.0000 | ORAL_TABLET | ORAL | Status: DC | PRN

## 2016-11-04 MED ORDER — OXYTOCIN 40 UNITS IN LACTATED RINGERS INFUSION - SIMPLE MED
1.0000 m[IU]/min | INTRAVENOUS | Status: DC
  Administered 2016-11-05: 2 m[IU]/min via INTRAVENOUS
  Filled 2016-11-04: qty 1000

## 2016-11-04 MED ORDER — LIDOCAINE HCL (PF) 1 % IJ SOLN: 30.0000 mL | INTRAMUSCULAR | Status: DC | PRN

## 2016-11-04 MED ORDER — OXYTOCIN 40 UNITS IN LACTATED RINGERS INFUSION - SIMPLE MED: 2.5000 [IU]/h | INTRAVENOUS | Status: DC

## 2016-11-04 MED ORDER — OXYCODONE-ACETAMINOPHEN 5-325 MG PO TABS: 2.0000 | ORAL_TABLET | ORAL | Status: DC | PRN

## 2016-11-04 MED ORDER — OXYTOCIN BOLUS FROM INFUSION: 500.0000 mL | Freq: Once | INTRAVENOUS | Status: DC

## 2016-11-04 MED ORDER — LACTATED RINGERS IV SOLN
INTRAVENOUS | Status: DC
  Administered 2016-11-04 – 2016-11-05 (×5): via INTRAVENOUS

## 2016-11-04 MED ORDER — MISOPROSTOL 25 MCG QUARTER TABLET
25.0000 ug | ORAL_TABLET | ORAL | Status: DC | PRN
  Administered 2016-11-04: 25 ug via VAGINAL
  Filled 2016-11-04: qty 1

## 2016-11-04 MED ORDER — PENICILLIN G POT IN DEXTROSE 60000 UNIT/ML IV SOLN
3.0000 10*6.[IU] | INTRAVENOUS | Status: DC
  Administered 2016-11-04 – 2016-11-05 (×8): 3 10*6.[IU] via INTRAVENOUS
  Filled 2016-11-04 (×10): qty 50

## 2016-11-04 MED ORDER — MISOPROSTOL 25 MCG QUARTER TABLET
25.0000 ug | ORAL_TABLET | ORAL | Status: DC | PRN
  Administered 2016-11-04: 25 ug via BUCCAL
  Filled 2016-11-04 (×2): qty 1

## 2016-11-04 NOTE — Progress Notes (Signed)
Dlaw[place],cnm reviewed FHR tracing since admission. To continue with plan of cytotec vaginal administration.

## 2016-11-04 NOTE — Anesthesia Pain Management Evaluation Note (Signed)
  CRNA Pain Management Visit Note  Patient: Ashley Costa, 30 y.o., female  "Hello I am a member of the anesthesia team at [facility]. We have an anesthesia team available at all times to provide care throughout the hospital, including epidural management and anesthesia for C-section. I don't know your plan for the delivery whether it a natural birth, water birth, IV sedation, nitrous supplementation, doula or epidural, but we want to meet your pain goals."   1.Was your pain managed to your expectations on prior hospitalizations?   No prior hospitalizations  2.What is your expectation for pain management during this hospitalization?     Labor support without medications  3.How can we help you reach that goal? unsure  Record the patient's initial score and the patient's pain goal.   Pain: 0  Pain Goal: 6 [facility] wants you to be able to say your pain was always managed very well.  Casimer Lanius 11/04/2016

## 2016-11-04 NOTE — H&P (Signed)
Ashley Costa is a 31 y.o. female G1 presenting for IOL for post dates. OB History    Gravida Para Term Preterm AB Living   1             SAB TAB Ectopic Multiple Live Births                 Past Medical History:  Diagnosis Date  . Endometriosis 2015  . Medical history non-contributory    Past Surgical History:  Procedure Laterality Date  . TONSILECTOMY/ADENOIDECTOMY WITH MYRINGOTOMY    . Wisdom teeth abstracted     Family History: family history includes Diabetes in her father and mother; Hypertension in her father and mother. Social History:  reports that she quit smoking about 9 months ago. Her smoking use included Cigarettes. She has never used smokeless tobacco. She reports that she does not drink alcohol or use drugs.     Maternal Diabetes: No Genetic Screening: Normal Maternal Ultrasounds/Referrals: Normal Fetal Ultrasounds or other Referrals:  None Maternal Substance Abuse:  No Significant Maternal Medications:  None Significant Maternal Lab Results:  Lab values include: Group B Strep positive Other Comments:  None  Review of Systems  Constitutional: Negative.   HENT: Negative.   Eyes: Negative.   Respiratory: Negative.   Cardiovascular: Negative.   Gastrointestinal: Negative.   Genitourinary: Negative.   Musculoskeletal: Negative.   Skin: Negative.   Neurological: Negative.   Endo/Heme/Allergies: Negative.   Psychiatric/Behavioral: Negative.    Maternal Medical History:  Reason for admission: IOL for post dates  Contractions: none  Fetal activity: Perceived fetal activity is normal.   Last perceived fetal movement was within the past hour.    Prenatal complications: no prenatal complications Prenatal Complications - Diabetes: none.    Dilation: 1.5 Effacement (%): 70 Station: -2, -3 Exam by:: rzhang,rnc-ob Blood pressure 128/60, pulse 82, temperature 98.4 F (36.9 C), temperature source Oral, resp. rate 20, height 5\' 4"  (1.626 m), weight 247  lb (112 kg), last menstrual period 01/22/2016. Maternal Exam:  Uterine Assessment: No contractions  Abdomen: Patient reports no abdominal tenderness. Fetal presentation: vertex  Introitus: Normal vulva. Normal vagina.  Ferning test: not done.  Nitrazine test: not done. Amniotic fluid character: not assessed.  Pelvis: adequate for delivery.   Cervix: Cervix evaluated by digital exam.     Fetal Exam Fetal Monitor Review: Mode: ultrasound.   Variability: moderate (6-25 bpm).   Pattern: accelerations present.    Fetal State Assessment: Category I - tracings are normal.     Physical Exam  Constitutional: She is oriented to person, place, and time. She appears well-developed and well-nourished.  HENT:  Head: Normocephalic.  Eyes: Pupils are equal, round, and reactive to light.  Neck: Normal range of motion.  Cardiovascular: Normal rate, regular rhythm, normal heart sounds and intact distal pulses.   Respiratory: Effort normal and breath sounds normal.  GI: Soft. Bowel sounds are normal.  Genitourinary: Vagina normal and uterus normal.  Musculoskeletal: Normal range of motion.  Neurological: She is alert and oriented to person, place, and time. She has normal reflexes.  Skin: Skin is warm and dry.  Psychiatric: She has a normal mood and affect. Her behavior is normal. Judgment and thought content normal.    Prenatal labs: ABO, Rh: --/--/O POS (06/03 0800) Antibody: NEG (06/03 0800) Rubella: 1.56 (11/14 1653) RPR: Non Reactive (03/06 1055)  HBsAg: Negative (11/14 1653)  HIV: Non Reactive (03/06 1055)  GBS: Positive (04/25 0000)   Assessment/Plan: Morbid obesity  Post dates at 41 wks Plan: cytotec induction of labor 1-2/70/-2 GBS prophalaxis   [name] 11/04/2016, 10:11 AM

## 2016-11-04 NOTE — Progress Notes (Signed)
Ashley Costa is a 31 y.o. G1P0 at [redacted]w[redacted]d by ultrasound admitted for induction of labor due to Post dates. Due date 5/28.  Subjective:   Objective: BP 113/65   Pulse 71   Temp 98.2 F (36.8 C) (Tympanic)   Resp 20   Ht 5\' 4"  (1.626 m)   Wt 247 lb (112 kg)   LMP 01/22/2016   BMI 42.40 kg/m  No intake/output data recorded. No intake/output data recorded.  FHT:  FHR: 140's bpm, variability: moderate,  accelerations:  Present,  decelerations:  Absent UC:   none SVE:   Dilation: 1.5 Effacement (%): 70 Station: -2, -3 Exam by:: rzhang,rnc-ob  Labs: Lab Results  Component Value Date   WBC 8.9 11/04/2016   HGB 11.9 (L) 11/04/2016   HCT 33.7 (L) 11/04/2016   MCV 80.6 11/04/2016   PLT 332 11/04/2016    Assessment / Plan: not in labor  Labor: yet to be in labor Preeclampsia:  no signs or symptoms of toxicity Fetal Wellbeing:  Category I Pain Control:  Labor support without medications I/D:  n/a Anticipated MOD:  NSVD  Ashley Costa 11/04/2016, 5:49 PM

## 2016-11-04 NOTE — Progress Notes (Signed)
Ashley Costa is a 31 y.o. G1P0 at [redacted]w[redacted]d  admitted for induction of labor due to Post dates.   Subjective: Patient comfortable, starting to feel some cramps.   Objective: Vitals:   11/04/16 1716 11/04/16 1719 11/04/16 1800 11/04/16 2000  BP:  113/65  (!) 113/92  Pulse:  71  72  Resp: 20 18 18 18   Temp: 98.2 F (36.8 C)   98.3 F (36.8 C)  TempSrc: Tympanic     Weight:      Height:       FHT:  FHR: 130 bpm, variability: moderate,  accelerations:  Present,  decelerations:  Absent UC:   irregular, every 5-8 minutes SVE:   Dilation: 2 Effacement (%): 70 Station: -3 Exam by:: [employer]: Lab Results  Component Value Date   WBC 8.9 11/04/2016   HGB 11.9 (L) 11/04/2016   HCT 33.7 (L) 11/04/2016   MCV 80.6 11/04/2016   PLT 332 11/04/2016    Assessment / Plan: IUP at 41 weeks. IOL for postdates. GBS positive  Discussed with patient foley bulb for cervical ripening. Patient agreeable. Foley bulb placed and inflated with 60cc, patient tolerated procedure well.   Plan: foley bulb for cervical ripening. Continue cytotec. Anticipate pitocin when foley comes out.  Len Blalock SNM 11/04/2016, 9:19 PM

## 2016-11-04 NOTE — Progress Notes (Signed)
Pt had 1cm metal clitoral ring. Pt anf FOB working to remove.

## 2016-11-04 NOTE — Progress Notes (Signed)
Ashley Costa is a 31 y.o. G1P0 at [redacted]w[redacted]d by ultrasound admitted for induction of labor due to Post dates. Due date 5/28.  Subjective:   Objective: BP 122/61   Pulse 76   Temp 98.3 F (36.8 C) (Oral)   Resp 20   Ht 5\' 4"  (1.626 m)   Wt 247 lb (112 kg)   LMP 01/22/2016   BMI 42.40 kg/m  No intake/output data recorded. No intake/output data recorded.  FHT:  FHR: 150's bpm, variability: moderate,  accelerations:  Present,  decelerations:  Absent UC:   none SVE:   Dilation: 1.5 Effacement (%): 70 Station: -2, -3 Exam by:: rzhang,rnc-ob  Labs: Lab Results  Component Value Date   WBC 8.9 11/04/2016   HGB 11.9 (L) 11/04/2016   HCT 33.7 (L) 11/04/2016   MCV 80.6 11/04/2016   PLT 332 11/04/2016    Assessment / Plan: Induction of labor due to postterm,  progressing well on pitocin  Labor: yet to be in labor Preeclampsia:  no signs or symptoms of toxicity Fetal Wellbeing:  Category I Pain Control:  Labor support without medications I/D:  n/a Anticipated MOD:  NSVD  [name] 11/04/2016, 1:52 PM

## 2016-11-05 ENCOUNTER — Encounter (HOSPITAL_COMMUNITY): Payer: Self-pay

## 2016-11-05 ENCOUNTER — Encounter (HOSPITAL_COMMUNITY)
Admission: RE | Disposition: A | Discharge status: Home / Self Care | Payer: Self-pay | Source: Ambulatory Visit | Attending: Obstetrics and Gynecology

## 2016-11-05 ENCOUNTER — Inpatient Hospital Stay (HOSPITAL_COMMUNITY): Payer: Medicaid Other | Admitting: Anesthesiology

## 2016-11-05 DIAGNOSIS — Z3A41 41 weeks gestation of pregnancy: Secondary | ICD-10-CM

## 2016-11-05 DIAGNOSIS — O99824 Streptococcus B carrier state complicating childbirth: Secondary | ICD-10-CM

## 2016-11-05 DIAGNOSIS — O480 Post-term pregnancy: Secondary | ICD-10-CM

## 2016-11-05 SURGERY — Surgical Case
Anesthesia: Epidural

## 2016-11-05 MED ORDER — ONDANSETRON HCL 4 MG/2ML IJ SOLN
INTRAMUSCULAR | Status: AC
  Filled 2016-11-05: qty 2

## 2016-11-05 MED ORDER — CEFAZOLIN SODIUM-DEXTROSE 2-4 GM/100ML-% IV SOLN
2.0000 g | INTRAVENOUS | Status: AC
  Administered 2016-11-05: 2 g via INTRAVENOUS

## 2016-11-05 MED ORDER — KETOROLAC TROMETHAMINE 30 MG/ML IJ SOLN
30.0000 mg | Freq: Four times a day (QID) | INTRAMUSCULAR | Status: AC | PRN
  Administered 2016-11-06: 30 mg via INTRAVENOUS
  Filled 2016-11-05: qty 1

## 2016-11-05 MED ORDER — OXYCODONE HCL 5 MG PO TABS: 5.0000 mg | ORAL_TABLET | Freq: Once | ORAL | Status: DC | PRN

## 2016-11-05 MED ORDER — MEPERIDINE HCL 25 MG/ML IJ SOLN: 6.2500 mg | INTRAMUSCULAR | Status: DC | PRN

## 2016-11-05 MED ORDER — PROMETHAZINE HCL 25 MG/ML IJ SOLN: 6.2500 mg | INTRAMUSCULAR | Status: DC | PRN

## 2016-11-05 MED ORDER — SCOPOLAMINE 1 MG/3DAYS TD PT72
MEDICATED_PATCH | TRANSDERMAL | Status: DC | PRN
  Administered 2016-11-05: 1 via TRANSDERMAL

## 2016-11-05 MED ORDER — OXYTOCIN 10 UNIT/ML IJ SOLN
INTRAMUSCULAR | Status: AC
  Filled 2016-11-05: qty 4

## 2016-11-05 MED ORDER — PHENYLEPHRINE 40 MCG/ML (10ML) SYRINGE FOR IV PUSH (FOR BLOOD PRESSURE SUPPORT)
80.0000 ug | PREFILLED_SYRINGE | INTRAVENOUS | Status: DC | PRN
  Administered 2016-11-05 (×2): 80 ug via INTRAVENOUS
  Filled 2016-11-05: qty 5

## 2016-11-05 MED ORDER — ONDANSETRON HCL 4 MG/2ML IJ SOLN
INTRAMUSCULAR | Status: DC | PRN
  Administered 2016-11-05: 4 mg via INTRAVENOUS

## 2016-11-05 MED ORDER — LIDOCAINE-EPINEPHRINE (PF) 2 %-1:200000 IJ SOLN
INTRAMUSCULAR | Status: AC
  Filled 2016-11-05: qty 20

## 2016-11-05 MED ORDER — FENTANYL 2.5 MCG/ML BUPIVACAINE 1/10 % EPIDURAL INFUSION (WH - ANES)
14.0000 mL/h | INTRAMUSCULAR | Status: DC | PRN
  Administered 2016-11-05 (×2): 14 mL/h via EPIDURAL
  Administered 2016-11-05: 9 mL/h via EPIDURAL
  Filled 2016-11-05 (×2): qty 100

## 2016-11-05 MED ORDER — FENTANYL CITRATE (PF) 100 MCG/2ML IJ SOLN
INTRAMUSCULAR | Status: AC
  Filled 2016-11-05: qty 2

## 2016-11-05 MED ORDER — MIDAZOLAM HCL 2 MG/2ML IJ SOLN
INTRAMUSCULAR | Status: DC | PRN
  Administered 2016-11-05: 1 mg via INTRAVENOUS

## 2016-11-05 MED ORDER — HYDROMORPHONE HCL 1 MG/ML IJ SOLN
0.2500 mg | INTRAMUSCULAR | Status: DC | PRN
  Administered 2016-11-05: 0.5 mg via INTRAVENOUS

## 2016-11-05 MED ORDER — LACTATED RINGERS IV SOLN
INTRAVENOUS | Status: DC
  Administered 2016-11-05: 22:00:00 via INTRAUTERINE
  Administered 2016-11-05: 125 mL/h via INTRAUTERINE

## 2016-11-05 MED ORDER — EPHEDRINE 5 MG/ML INJ
10.0000 mg | INTRAVENOUS | Status: DC | PRN
  Filled 2016-11-05: qty 2

## 2016-11-05 MED ORDER — PHENYLEPHRINE 40 MCG/ML (10ML) SYRINGE FOR IV PUSH (FOR BLOOD PRESSURE SUPPORT)
80.0000 ug | PREFILLED_SYRINGE | INTRAVENOUS | Status: DC | PRN
  Filled 2016-11-05: qty 5

## 2016-11-05 MED ORDER — LIDOCAINE HCL (PF) 1 % IJ SOLN
INTRAMUSCULAR | Status: DC | PRN
  Administered 2016-11-05: 5 mL

## 2016-11-05 MED ORDER — MORPHINE SULFATE (PF) 0.5 MG/ML IJ SOLN
INTRAMUSCULAR | Status: AC
  Filled 2016-11-05: qty 10

## 2016-11-05 MED ORDER — FENTANYL CITRATE (PF) 100 MCG/2ML IJ SOLN
INTRAMUSCULAR | Status: DC | PRN
  Administered 2016-11-05: 50 ug via INTRAVENOUS

## 2016-11-05 MED ORDER — SODIUM BICARBONATE 8.4 % IV SOLN
INTRAVENOUS | Status: DC | PRN
  Administered 2016-11-05: 4 mL via EPIDURAL
  Administered 2016-11-05 (×2): 3 mL via EPIDURAL
  Administered 2016-11-05: 2 mL via EPIDURAL
  Administered 2016-11-05: 3 mL via EPIDURAL

## 2016-11-05 MED ORDER — HYDROMORPHONE HCL 1 MG/ML IJ SOLN
INTRAMUSCULAR | Status: AC
  Filled 2016-11-05: qty 1

## 2016-11-05 MED ORDER — LACTATED RINGERS IV SOLN: 500.0000 mL | Freq: Once | INTRAVENOUS | Status: DC

## 2016-11-05 MED ORDER — SCOPOLAMINE 1 MG/3DAYS TD PT72
MEDICATED_PATCH | TRANSDERMAL | Status: AC
  Filled 2016-11-05: qty 1

## 2016-11-05 MED ORDER — MIDAZOLAM HCL 2 MG/2ML IJ SOLN
INTRAMUSCULAR | Status: AC
  Filled 2016-11-05: qty 2

## 2016-11-05 MED ORDER — OXYCODONE HCL 5 MG/5ML PO SOLN: 5.0000 mg | Freq: Once | ORAL | Status: DC | PRN

## 2016-11-05 MED ORDER — PHENYLEPHRINE 40 MCG/ML (10ML) SYRINGE FOR IV PUSH (FOR BLOOD PRESSURE SUPPORT)
80.0000 ug | PREFILLED_SYRINGE | INTRAVENOUS | Status: DC | PRN
  Filled 2016-11-05: qty 10
  Filled 2016-11-05: qty 5

## 2016-11-05 MED ORDER — DIPHENHYDRAMINE HCL 50 MG/ML IJ SOLN: 12.5000 mg | INTRAMUSCULAR | Status: DC | PRN

## 2016-11-05 MED ORDER — PHENYLEPHRINE 40 MCG/ML (10ML) SYRINGE FOR IV PUSH (FOR BLOOD PRESSURE SUPPORT)
PREFILLED_SYRINGE | INTRAVENOUS | Status: AC
Start: 2016-11-05 — End: 2016-11-05
  Filled 2016-11-05: qty 10

## 2016-11-05 MED ORDER — SOD CITRATE-CITRIC ACID 500-334 MG/5ML PO SOLN: 30.0000 mL | ORAL | Status: DC

## 2016-11-05 MED ORDER — EPHEDRINE 5 MG/ML INJ
10.0000 mg | INTRAVENOUS | Status: DC | PRN
  Filled 2016-11-05: qty 4
  Filled 2016-11-05: qty 2

## 2016-11-05 MED ORDER — KETOROLAC TROMETHAMINE 30 MG/ML IJ SOLN
30.0000 mg | Freq: Four times a day (QID) | INTRAMUSCULAR | Status: AC | PRN
  Administered 2016-11-06: 30 mg via INTRAMUSCULAR

## 2016-11-05 MED ORDER — LACTATED RINGERS IV SOLN
INTRAVENOUS | Status: DC | PRN
  Administered 2016-11-05: 40 [IU] via INTRAVENOUS

## 2016-11-05 MED ORDER — MORPHINE SULFATE (PF) 0.5 MG/ML IJ SOLN
INTRAMUSCULAR | Status: DC | PRN
  Administered 2016-11-05: 4 mg via EPIDURAL
  Administered 2016-11-05: 1 mg via INTRAVENOUS

## 2016-11-05 MED ORDER — CEFAZOLIN SODIUM-DEXTROSE 2-4 GM/100ML-% IV SOLN
INTRAVENOUS | Status: AC
  Filled 2016-11-05: qty 100

## 2016-11-05 MED ORDER — PHENYLEPHRINE HCL 10 MG/ML IJ SOLN
INTRAMUSCULAR | Status: DC | PRN
  Administered 2016-11-05: 80 ug via INTRAVENOUS
  Administered 2016-11-05 (×2): 40 ug via INTRAVENOUS
  Administered 2016-11-05 (×3): 80 ug via INTRAVENOUS

## 2016-11-05 MED ORDER — EPHEDRINE 5 MG/ML INJ
10.0000 mg | INTRAVENOUS | Status: DC | PRN
  Administered 2016-11-05: 10 mg via INTRAVENOUS
  Filled 2016-11-05: qty 2

## 2016-11-05 MED ORDER — LACTATED RINGERS IV SOLN
500.0000 mL | Freq: Once | INTRAVENOUS | Status: AC
  Administered 2016-11-05: 500 mL via INTRAVENOUS

## 2016-11-05 SURGICAL SUPPLY — 40 items
CANISTER SUCT 3000ML PPV (MISCELLANEOUS) ×2 IMPLANT
CHLORAPREP W/TINT 26ML (MISCELLANEOUS) ×2 IMPLANT
CLAMP CORD UMBIL (MISCELLANEOUS) ×2 IMPLANT
DERMABOND ADHESIVE PROPEN (GAUZE/BANDAGES/DRESSINGS) ×1
DERMABOND ADVANCED .7 DNX6 (GAUZE/BANDAGES/DRESSINGS) ×1 IMPLANT
DRAPE EENT ADH APERT 31X51 STR (DRAPES) ×2 IMPLANT
DRSG OPSITE POSTOP 4X10 (GAUZE/BANDAGES/DRESSINGS) ×2 IMPLANT
ELECT REM PT RETURN 9FT ADLT (ELECTROSURGICAL) ×2
ELECTRODE REM PT RTRN 9FT ADLT (ELECTROSURGICAL) ×1 IMPLANT
GLOVE BIO SURGEON STRL SZ 6 (GLOVE) ×2 IMPLANT
GLOVE BIO SURGEON STRL SZ7 (GLOVE) ×4 IMPLANT
GLOVE BIOGEL PI IND STRL 6 (GLOVE) ×1 IMPLANT
GLOVE BIOGEL PI IND STRL 6.5 (GLOVE) ×2 IMPLANT
GLOVE BIOGEL PI IND STRL 7.0 (GLOVE) ×2 IMPLANT
GLOVE BIOGEL PI IND STRL 7.5 (GLOVE) ×2 IMPLANT
GLOVE BIOGEL PI INDICATOR 6 (GLOVE) ×1
GLOVE BIOGEL PI INDICATOR 6.5 (GLOVE) ×2
GLOVE BIOGEL PI INDICATOR 7.0 (GLOVE) ×2
GLOVE BIOGEL PI INDICATOR 7.5 (GLOVE) ×2
GLOVE ECLIPSE 6.5 STRL STRAW (GLOVE) ×2 IMPLANT
GLOVE SKINSENSE NS SZ7.0 (GLOVE) ×1
GLOVE SKINSENSE STRL SZ7.0 (GLOVE) ×1 IMPLANT
GLOVE SURG SS PI 7.0 STRL IVOR (GLOVE) ×4 IMPLANT
GOWN STRL REUS W/ TWL LRG LVL3 (GOWN DISPOSABLE) ×3 IMPLANT
GOWN STRL REUS W/ TWL XL LVL3 (GOWN DISPOSABLE) ×1 IMPLANT
GOWN STRL REUS W/TWL LRG LVL3 (GOWN DISPOSABLE) ×3
GOWN STRL REUS W/TWL XL LVL3 (GOWN DISPOSABLE) ×1
NS IRRIG 1000ML POUR BTL (IV SOLUTION) ×2 IMPLANT
PACK C SECTION WH (CUSTOM PROCEDURE TRAY) ×2 IMPLANT
PAD OB MATERNITY 4.3X12.25 (PERSONAL CARE ITEMS) ×4 IMPLANT
PAD PREP 24X48 CUFFED NSTRL (MISCELLANEOUS) ×2 IMPLANT
SPONGE LAP 18X18 X RAY DECT (DISPOSABLE) ×2 IMPLANT
SUT MON AB 4-0 PS1 27 (SUTURE) ×2 IMPLANT
SUT MON AB-0 CT1 36 (SUTURE) ×6 IMPLANT
SUT PLAIN 2 0 (SUTURE) ×2
SUT PLAIN ABS 2-0 CT1 27XMFL (SUTURE) ×2 IMPLANT
SUT VIC AB 0 CT1 36 (SUTURE) ×4 IMPLANT
SUT VIC AB 3-0 CT1 27 (SUTURE) ×1
SUT VIC AB 3-0 CT1 TAPERPNT 27 (SUTURE) ×1 IMPLANT
SUT VIC AB 4-0 KS 27 (SUTURE) ×2 IMPLANT

## 2016-11-05 NOTE — Op Note (Signed)
Operative Note   SURGERY DATE: 11/05/2016  PRE-OP DIAGNOSIS:  *Pregnancy @ 41/1 *Failed induction of labor *Category II tracing (fetal tachycardia)  POST-OP DIAGNOSIS: Same. delivered   PROCEDURE: primary low transverse cesarean section via pfannenstiel skin incision with double layer uterine closure  SURGEON: Surgeon(s) and Role:     * Aletha Halim, MD - Primary  ASSISTANT:    Waldemar Dickens, MD - Assisting  ANESTHESIA: epidural  ESTIMATED BLOOD LOSS: 695mL  DRAINS: 131mL UOP via indwelling foley  TOTAL IV FLUIDS: 2023mL crystalloid  VTE PROPHYLAXIS: SCDs to bilateral lower extremities  ANTIBIOTICS: Two grams of Cefazolin were given., within 1 hour of skin incision  SPECIMENS: none  COMPLICATIONS: none  INDICATIONS: arrest of dilation at 4cm at 18 hours s/p AROM and on pitocin.   FINDINGS: No intra-abdominal adhesions were noted. Grossly normal uterus, tubes and ovaries. Moderate meoconium stained amniotic fluid, cephalic (asynctilic)  female infant, weight 2840gm, APGARs 8/9, intact placenta. Somewhat loose nuchal (reduced) x 1.   PROCEDURE IN DETAIL: The patient was taken to the operating room where anesthesia was administered and normal fetal heart tones were confirmed. She was then prepped and draped in the normal fashion in the dorsal supine position with a leftward tilt.  After a time out was performed, a pfannensteil  skin incision was made with the scalpel and carried through to the underlying layer of fascia. The fascia was then incised at the midline and this incision was extended laterally with the mayo scissors. Attention was turned to the superior aspect of the fascial incision which was grasped with the kocher clamps x 2, tented up and the rectus muscles were dissected off with the bovie. In a similar fashion the inferior aspect of the fascial incision was grasped with the kocher clamps, tented up and the rectus muscles dissected off with the mayo  scissors. The rectus muscles were then separated in the midline and the peritoneum was entered bluntly. The bladder blade was inserted and the vesicouterine peritoneum was identified, tented up and entered with the metzenbaum scissors. This incision was extended laterally and the bladder flap was created digitally. The bladder blade was reinserted.  A low transverse hysterotomy was made with the scalpel until the endometrial cavity was breached and the amniotic sac ruptured with the Allis clamp, yielding moderate meconium stained amniotic fluid. This incision was extended bluntly and the infant's head, shoulders and body were delivered atraumatically.The cord was clamped x 2 and cut, and the infant was handed to the awaiting pediatricians, after delayed cord clamping was done.  The placenta was then gradually expressed from the uterus and then the uterus was exteriorized and cleared of all clots and debris. The hysterotomy was repaired with a running suture of 1-0 monocryl. A second imbricating layer of 1-0 monocryl suture was then placed to achieve excellent hemostasis.   The uterus and adnexa were then returned to the abdomen, and the hysterotomy and all operative sites were reinspected and excellent hemostasis was noted after irrigation and suction of the abdomen with warm saline.  The fascia was reapproximated with 0 Vicryl in a simple running fashion bilaterally. The subcutaneous layer was then reapproximated with interrupted sutures of 2-0 plain gut, and the skin was then closed with 4-0 vicryl, in a subcuticular fashion.  The patient  tolerated the procedure well. Sponge, lap, needle, and instrument counts were correct x 2. The patient was transferred to the recovery room awake, alert and breathing independently in stable condition.  Durene Romans MD Attending Center for [facility])

## 2016-11-05 NOTE — Progress Notes (Signed)
Called to patient room urgently for deceleration and fetal bradycardia. Patietn has just gotten her epdirual. HR in the 70's.  She has received 2 doses of phenylepherine and one dose of ephedrine per nursing.   FSE placed. Cervix check 4/50/-2.   Patient had been on both sides. Placed in trendenelenburg and patietn with O2 on.  Fetal HR improved to 140's. FSE replaced.  Jacquiline Doe, MD 11/05/16 9:33 AM

## 2016-11-05 NOTE — Anesthesia Preprocedure Evaluation (Signed)
Anesthesia Evaluation  Patient identified by MRN, date of birth, ID band Patient awake    Reviewed: Allergy & Precautions, Patient's Chart, lab work & pertinent test results  Airway Mallampati: III       Dental no notable dental hx.    Pulmonary former smoker,    Pulmonary exam normal        Cardiovascular negative cardio ROS   Rhythm:Regular Rate:Normal     Neuro/Psych negative neurological ROS  negative psych ROS   GI/Hepatic negative GI ROS, Neg liver ROS,   Endo/Other  negative endocrine ROS  Renal/GU negative Renal ROS  negative genitourinary   Musculoskeletal negative musculoskeletal ROS (+)   Abdominal   Peds negative pediatric ROS (+)  Hematology negative hematology ROS (+)   Anesthesia Other Findings   Reproductive/Obstetrics (+) Pregnancy                             Lab Results  Component Value Date   WBC 8.9 11/04/2016   HGB 11.9 (L) 11/04/2016   HCT 33.7 (L) 11/04/2016   MCV 80.6 11/04/2016   PLT 332 11/04/2016     Anesthesia Physical Anesthesia Plan  ASA: III  Anesthesia Plan: Epidural   Post-op Pain Management:    Induction:   Airway Management Planned:   Additional Equipment:   Intra-op Plan:   Post-operative Plan:   Informed Consent: I have reviewed the patients History and Physical, chart, labs and discussed the procedure including the risks, benefits and alternatives for the proposed anesthesia with the patient or authorized representative who has indicated his/her understanding and acceptance.     Plan Discussed with:   Anesthesia Plan Comments:         Anesthesia Quick Evaluation

## 2016-11-05 NOTE — Progress Notes (Signed)
Patient seen with recurrent decelerations. They are variables and lates. Cervix unchagned. Contraction inadequate. Discussed c-section with patient and she is in agreement.  The risks of cesarean section were discussed with the patient including but were not limited to: bleeding which may require transfusion or reoperation; infection which may require antibiotics; injury to bowel, bladder, ureters or other surrounding organs; injury to the fetus; need for additional procedures including hysterectomy in the event of a life-threatening hemorrhage; placental abnormalities wth subsequent pregnancies, incisional problems, thromboembolic phenomenon and other postoperative/anesthesia complications.  NPO and she will remain NPO for procedure. Anesthesia and OR aware.  Preoperative prophylactic antibiotics and SCDs ordered on call to the OR.  To OR when ready.    Jacquiline Doe, MD 11/05/16 8:58 PM

## 2016-11-05 NOTE — Progress Notes (Signed)
Patient seen and doing well. No complaints. Recurrent variable decelerations noted. Baseline FHR is 150 with mod var. Will start amnioinfusion.  Jacquiline Doe, MD 11/05/16 2:31 PM

## 2016-11-05 NOTE — Transfer of Care (Signed)
Immediate Anesthesia Transfer of Care Note  Patient: Ashley Costa  Procedure(s) Performed: Procedure(s): CESAREAN SECTION (N/A)  Patient Location: PACU  Anesthesia Type:Epidural  Level of Consciousness: awake, alert , oriented and patient cooperative  Airway & Oxygen Therapy: Patient Spontanous Breathing  Post-op Assessment: Report given to RN and Post -op Vital signs reviewed and stable  Post vital signs: Reviewed and stable  Last Vitals:  Vitals:   11/05/16 2024 11/05/16 2107  BP: (!) 136/50 (!) 133/48  Pulse: 76 72  Resp: 16   Temp: 36.8 C     Last Pain:  Vitals:   11/05/16 2100  TempSrc:   PainSc: 0-No pain      Patients Stated Pain Goal: 7 (98/02/21 7981)  Complications: No apparent anesthesia complications

## 2016-11-05 NOTE — Anesthesia Procedure Notes (Signed)
Epidural Patient location during procedure: OB Start time: 11/05/2016 8:56 AM End time: 11/05/2016 9:03 AM  Staffing Anesthesiologist: Suella Broad D Performed: anesthesiologist   Preanesthetic Checklist Completed: patient identified, site marked, surgical consent, pre-op evaluation, timeout performed, IV checked, risks and benefits discussed and monitors and equipment checked  Epidural Patient position: sitting Prep: ChloraPrep Patient monitoring: heart rate, continuous pulse ox and blood pressure Approach: midline Location: L3-L4 Injection technique: LOR saline  Needle:  Needle type: Tuohy  Needle gauge: 17 G Needle length: 9 cm Catheter type: closed end flexible Catheter size: 20 Guage Test dose: negative and 1.5% lidocaine  Assessment Events: blood not aspirated, injection not painful, no injection resistance and no paresthesia  Additional Notes LOR @ 7  Patient identified. Risks/Benefits/Options discussed with patient including but not limited to bleeding, infection, nerve damage, paralysis, failed block, incomplete pain control, headache, blood pressure changes, nausea, vomiting, reactions to medications, itching and postpartum back pain. Confirmed with bedside nurse the patient's most recent platelet count. Confirmed with patient that they are not currently taking any anticoagulation, have any bleeding history or any family history of bleeding disorders. Patient expressed understanding and wished to proceed. All questions were answered. Sterile technique was used throughout the entire procedure. Please see nursing notes for vital signs. Test dose was given through epidural catheter and negative prior to continuing to dose epidural or start infusion. Warning signs of high block given to the patient including shortness of breath, tingling/numbness in hands, complete motor block, or any concerning symptoms with instructions to call for help. Patient was given instructions on fall  risk and not to get out of bed. All questions and concerns addressed with instructions to call with any issues or inadequate analgesia.    Reason for block:procedure for pain

## 2016-11-05 NOTE — Progress Notes (Signed)
Labor Progress Note  Ashley Costa is a 31 y.o. G1P0 at [redacted]w[redacted]d admitted for induction of labor due to post dates.  S: Patient is resting comfortably; has no questions or concerns. Denies pain.   O:  BP 110/72   Pulse 95   Temp 98.3 F (36.8 C) (Oral)   Resp 18   Ht 5\' 4"  (1.626 m)   Wt 112 kg (247 lb)   LMP 01/22/2016   BMI 42.40 kg/m   No intake/output data recorded.  FHT: FHR: 160 bpm; variability: moderate; accelerations: absent; decelerations: subtle late declerations, improving with repositioning. UC:  Every 3-4 min SVE:   Dilation: 4 Effacement (%): 90 Station: -1 Exam by:: Drr. Shrey Boike  Pitocin @ 4 mu/min (last dose 1932)  Labs: Lab Results  Component Value Date   WBC 8.9 11/04/2016   HGB 11.9 (L) 11/04/2016   HCT 33.7 (L) 11/04/2016   MCV 80.6 11/04/2016   PLT 332 11/04/2016    Assessment / Plan: 31 y.o. G1P0 [redacted]w[redacted]d admitted for IOL due to postdates.   Labor: Progressing on Pitocin, will continue to increase then AROM Fetal Wellbeing:  Category I Pain Control:  Epidural Anticipated MOD:  NSVD  Expectant management on Pitocin.  Ashley Costa. MS3  Fetal heart tracing reviewed. Will check patient if decels continue.  Ashley Doe, MD 11/05/16 8:46 PM

## 2016-11-05 NOTE — Progress Notes (Signed)
S: Patient noted to have prolonged deceleration to 70 bmp after placed back on monitors s/p epidural. Patient repositioned. Fetal scalp electrode placed. OR notified. Patient placed in trendelenburg.  Fetal heart tracing improved, now stable @150 .   Dilation: 4 Effacement (%): 70 Cervical Position: Posterior Station: -3 Presentation: Vertex Exam by:: S Moyer   FHT: *150**bpm, mod var, +accels, + late decel and prolonged decel as noted above, now no decels TOCO: q23min   A/P: Pain: Epidural Labor: Progressing with pitocin FWB: continue to monitor closely  Dr. Baron Sane and Dr. Ilda Basset present at bedside during this encounter. Continue expectant management Anticipate SVD

## 2016-11-05 NOTE — Progress Notes (Signed)
Labor Progress Note Ashley Costa is a 31 y.o. G1P0 at [redacted]w[redacted]d presented for IOL for post dates  S:  Feeling some ctx but comfortable.  O:  BP 120/75   Pulse 67   Temp 98.5 F (36.9 C) (Oral)   Resp 18   Ht 5\' 4"  (1.626 m)   Wt 112 kg (247 lb)   LMP 01/22/2016   BMI 42.40 kg/m  EFM: baseline 150 bpm/ mod variability/ no accels/ variable decels  Toco: indeterminable SVE: Dilation: 4 Effacement (%): 70 Cervical Position: Posterior Station: -3 Presentation: Vertex Exam by:: Colman Cater, CNM   A/P: 31 y.o. G1P0 [redacted]w[redacted]d  1. Labor: latent 2. FWB: Cat II 3. Pain: analgesia/anesthesia prn Foley out. S/p Cytotec #4. Multiple attempts to trace ctx with toco unsuccessful. AROM and IUPC placed. Monitor fetal tracing closely. Anticipate SVD.  Julianne Handler, CNM 4:45 AM

## 2016-11-06 ENCOUNTER — Encounter (HOSPITAL_COMMUNITY): Payer: Self-pay | Admitting: Obstetrics and Gynecology

## 2016-11-06 LAB — CBC
HEMATOCRIT: 32 % — AB (ref 36.0–46.0)
HEMOGLOBIN: 11.2 g/dL — AB (ref 12.0–15.0)
MCH: 28.7 pg (ref 26.0–34.0)
MCHC: 35 g/dL (ref 30.0–36.0)
MCV: 82.1 fL (ref 78.0–100.0)
Platelets: 298 10*3/uL (ref 150–400)
RBC: 3.9 MIL/uL (ref 3.87–5.11)
RDW: 14.5 % (ref 11.5–15.5)
WBC: 16.8 10*3/uL — ABNORMAL HIGH (ref 4.0–10.5)

## 2016-11-06 MED ORDER — MENTHOL 3 MG MT LOZG: 1.0000 | LOZENGE | OROMUCOSAL | Status: DC | PRN

## 2016-11-06 MED ORDER — NALBUPHINE HCL 10 MG/ML IJ SOLN: 5.0000 mg | Freq: Once | INTRAMUSCULAR | Status: DC | PRN

## 2016-11-06 MED ORDER — DIPHENHYDRAMINE HCL 25 MG PO CAPS: 25.0000 mg | ORAL_CAPSULE | Freq: Four times a day (QID) | ORAL | Status: DC | PRN

## 2016-11-06 MED ORDER — NALOXONE HCL 0.4 MG/ML IJ SOLN: 0.4000 mg | INTRAMUSCULAR | Status: DC | PRN

## 2016-11-06 MED ORDER — PRENATAL MULTIVITAMIN CH
1.0000 | ORAL_TABLET | Freq: Every day | ORAL | Status: DC
  Administered 2016-11-06 – 2016-11-08 (×3): 1 via ORAL
  Filled 2016-11-06 (×3): qty 1

## 2016-11-06 MED ORDER — WITCH HAZEL-GLYCERIN EX PADS: 1.0000 "application " | MEDICATED_PAD | CUTANEOUS | Status: DC | PRN

## 2016-11-06 MED ORDER — IBUPROFEN 600 MG PO TABS
600.0000 mg | ORAL_TABLET | Freq: Four times a day (QID) | ORAL | Status: DC
  Administered 2016-11-06 – 2016-11-08 (×9): 600 mg via ORAL
  Filled 2016-11-06 (×9): qty 1

## 2016-11-06 MED ORDER — NALOXONE HCL 2 MG/2ML IJ SOSY
1.0000 ug/kg/h | PREFILLED_SYRINGE | INTRAVENOUS | Status: DC | PRN
  Filled 2016-11-06: qty 2

## 2016-11-06 MED ORDER — NALBUPHINE HCL 10 MG/ML IJ SOLN
5.0000 mg | INTRAMUSCULAR | Status: DC | PRN
  Administered 2016-11-06 – 2016-11-08 (×3): 5 mg via SUBCUTANEOUS
  Filled 2016-11-06 (×3): qty 1

## 2016-11-06 MED ORDER — TETANUS-DIPHTH-ACELL PERTUSSIS 5-2.5-18.5 LF-MCG/0.5 IM SUSP: 0.5000 mL | Freq: Once | INTRAMUSCULAR | Status: DC

## 2016-11-06 MED ORDER — ONDANSETRON HCL 4 MG/2ML IJ SOLN: 4.0000 mg | Freq: Three times a day (TID) | INTRAMUSCULAR | Status: DC | PRN

## 2016-11-06 MED ORDER — SIMETHICONE 80 MG PO CHEW
80.0000 mg | CHEWABLE_TABLET | Freq: Three times a day (TID) | ORAL | Status: DC
  Administered 2016-11-06 – 2016-11-08 (×5): 80 mg via ORAL
  Filled 2016-11-06 (×6): qty 1

## 2016-11-06 MED ORDER — DIPHENHYDRAMINE HCL 25 MG PO CAPS: 25.0000 mg | ORAL_CAPSULE | ORAL | Status: DC | PRN

## 2016-11-06 MED ORDER — POLYETHYLENE GLYCOL 3350 17 G PO PACK
17.0000 g | PACK | Freq: Every day | ORAL | Status: DC
  Filled 2016-11-06 (×3): qty 1

## 2016-11-06 MED ORDER — OXYCODONE HCL 5 MG PO TABS
10.0000 mg | ORAL_TABLET | ORAL | Status: DC | PRN
  Administered 2016-11-07 – 2016-11-08 (×2): 10 mg via ORAL
  Filled 2016-11-06 (×2): qty 2

## 2016-11-06 MED ORDER — DIPHENHYDRAMINE HCL 50 MG/ML IJ SOLN: 12.5000 mg | INTRAMUSCULAR | Status: DC | PRN

## 2016-11-06 MED ORDER — COCONUT OIL OIL: 1.0000 "application " | TOPICAL_OIL | Status: DC | PRN

## 2016-11-06 MED ORDER — SODIUM CHLORIDE 0.9% FLUSH: 3.0000 mL | INTRAVENOUS | Status: DC | PRN

## 2016-11-06 MED ORDER — ACETAMINOPHEN 325 MG PO TABS
650.0000 mg | ORAL_TABLET | ORAL | Status: DC | PRN
  Administered 2016-11-07 (×3): 650 mg via ORAL
  Filled 2016-11-06 (×3): qty 2

## 2016-11-06 MED ORDER — SCOPOLAMINE 1 MG/3DAYS TD PT72
1.0000 | MEDICATED_PATCH | Freq: Once | TRANSDERMAL | Status: DC
  Filled 2016-11-06: qty 1

## 2016-11-06 MED ORDER — OXYCODONE HCL 5 MG PO TABS
5.0000 mg | ORAL_TABLET | ORAL | Status: DC | PRN
  Administered 2016-11-06 – 2016-11-08 (×5): 5 mg via ORAL
  Filled 2016-11-06 (×5): qty 1

## 2016-11-06 MED ORDER — LACTATED RINGERS IV SOLN
INTRAVENOUS | Status: DC
  Administered 2016-11-06: 10:00:00 via INTRAVENOUS

## 2016-11-06 MED ORDER — SENNOSIDES-DOCUSATE SODIUM 8.6-50 MG PO TABS
2.0000 | ORAL_TABLET | Freq: Every evening | ORAL | Status: DC | PRN
  Administered 2016-11-07: 2 via ORAL
  Filled 2016-11-06: qty 2

## 2016-11-06 MED ORDER — NALBUPHINE HCL 10 MG/ML IJ SOLN
5.0000 mg | INTRAMUSCULAR | Status: DC | PRN
  Administered 2016-11-06: 5 mg via INTRAVENOUS
  Filled 2016-11-06: qty 1

## 2016-11-06 MED ORDER — OXYTOCIN 40 UNITS IN LACTATED RINGERS INFUSION - SIMPLE MED: 2.5000 [IU]/h | INTRAVENOUS | Status: AC

## 2016-11-06 MED ORDER — DIBUCAINE 1 % RE OINT: 1.0000 "application " | TOPICAL_OINTMENT | RECTAL | Status: DC | PRN

## 2016-11-06 MED ORDER — KETOROLAC TROMETHAMINE 30 MG/ML IJ SOLN
INTRAMUSCULAR | Status: AC
  Filled 2016-11-06: qty 1

## 2016-11-06 NOTE — Anesthesia Postprocedure Evaluation (Signed)
Anesthesia Post Note  Patient: Dance movement psychotherapist  Procedure(s) Performed: Procedure(s) (LRB): CESAREAN SECTION (N/A)     Patient location during evaluation: Mother Baby Anesthesia Type: Epidural Level of consciousness: awake and alert and oriented Pain management: pain level controlled Vital Signs Assessment: post-procedure vital signs reviewed and stable Respiratory status: spontaneous breathing and nonlabored ventilation Cardiovascular status: stable Postop Assessment: no headache, no backache, patient able to bend at knees, no signs of nausea or vomiting and adequate PO intake Anesthetic complications: no    Last Vitals:  Vitals:   11/06/16 0300 11/06/16 0500  BP: (!) 110/56   Pulse: 75   Resp: 20 20  Temp: 36.7 C 36.7 C    Last Pain:  Vitals:   11/06/16 0612  TempSrc:   PainSc: 1    Pain Goal: Patients Stated Pain Goal: 7 (11/05/16 4388)               Willa Rough

## 2016-11-06 NOTE — Progress Notes (Signed)
Subjective: Postpartum Day #1: Cesarean Delivery Patient reports incisional pain.  Had N&V with eating last night, has not attempted since.  Encouraged liquid diet and progress slowly.  No flatus. Foley Catheter in place.   Objective: Vital signs in last 24 hours: Temp:  [98 F (36.7 C)-99.1 F (37.3 C)] 98 F (36.7 C) (06/05 0500) Pulse Rate:  [53-119] 75 (06/05 0300) Resp:  [13-23] 20 (06/05 0500) BP: (63-153)/(40-85) 110/56 (06/05 0300) SpO2:  [95 %-99 %] 96 % (06/05 0500)  Physical Exam:  General: alert, cooperative and no distress Lochia: appropriate Uterine Fundus: firm Incision: no significant drainage, no dehiscence, no significant erythema DVT Evaluation: No evidence of DVT seen on physical exam. No cords or calf tenderness. No significant calf/ankle edema.  SCDs on.     Recent Labs  11/04/16 0800 11/06/16 0514  HGB 11.9* 11.2*  HCT 33.7* 32.0*    Assessment/Plan: Status post Cesarean section. Doing well postoperatively.  Continue current care.  Remove Foley cath this AM.   Morene Crocker 11/06/2016, 7:55 AM

## 2016-11-06 NOTE — Anesthesia Postprocedure Evaluation (Signed)
Anesthesia Post Note  Patient: Dance movement psychotherapist  Procedure(s) Performed: Procedure(s) (LRB): CESAREAN SECTION (N/A)     Patient location during evaluation: Mother Baby Anesthesia Type: Epidural Level of consciousness: awake and alert Pain management: pain level controlled Vital Signs Assessment: post-procedure vital signs reviewed and stable Respiratory status: spontaneous breathing, nonlabored ventilation and respiratory function stable Cardiovascular status: stable Postop Assessment: no headache, no backache and epidural receding Anesthetic complications: no    Last Vitals:  Vitals:   11/06/16 0005 11/06/16 0021  BP:  (!) 106/50  Pulse: 86 79  Resp: (!) 22 20  Temp:  37.1 C    Last Pain:  Vitals:   11/06/16 0021  TempSrc: Oral  PainSc:    Pain Goal: Patients Stated Pain Goal: 7 (11/05/16 6415)               Lynda Rainwater

## 2016-11-06 NOTE — Lactation Note (Addendum)
This note was copied from a baby's chart. Lactation Consultation Note  Patient Name: Ashley Costa [id number]'E Date: 11/06/2016 Reason for consult: Follow-up assessment  Follow up visit at 20 hours of age.  Baby has had some bottle feedings of formula and mom has pumped a few times and given EBM with syringe.  LC reviewed with mom finger feeding with curve tipped syringe during sucking and to call for assist as needed.  LC encouraged mom as ok to offer EBM in bottle as she is with formula.   Mom has large diameter nipples (nickel sized), Baby placed STS with colostrum easily expressed.  Areola non compressible and baby will not open mouth.   LC encouraged mom to pump and then offer EBM.  TMJ massage and suck training explained to parents. [facility] LC resources given and discussed.  Encouraged to feed with early cues on demand.  Early newborn behavior discussed.  Hand expression demonstrated with colostrum visible.  Mom to call for assist as needed.  LC provided mom with #30 flange for left breast, mom reports more comfort with #27 at this time.   20:00 mom requests Additional #30 flange and she noticed better milk flow with pumping using large flange.   Maternal Data Has patient been taught Hand Expression?: Yes  Feeding Feeding Type: Breast Fed Nipple Type: Slow - flow  LATCH Score/Interventions Latch: Too sleepy or reluctant, no latch achieved, no sucking elicited. Intervention(s): Skin to skin;Teach feeding cues;Waking techniques Intervention(s): Breast massage;Breast compression  Audible Swallowing: None  Type of Nipple: Everted at rest and after stimulation (tough areola hard to compress)  Comfort (Breast/Nipple): Soft / non-tender     Hold (Positioning): Assistance needed to correctly position infant at breast and maintain latch. Intervention(s): Breastfeeding basics reviewed;Support Pillows;Position options;Skin to skin  LATCH Score: 5  Lactation Tools Discussed/Used WIC  Program: Yes Pump Review: Setup, frequency, and cleaning;Milk Storage Initiated by:: JS IBCLC Date initiated:: 11/07/16   Consult Status Consult Status: Follow-up Date: 11/07/16 Follow-up type: In-patient    Shoptaw, Justine Null 11/06/2016, 6:22 PM

## 2016-11-06 NOTE — Lactation Note (Signed)
This note was copied from a baby's chart. Lactation Consultation Note  Patient Name: Ashley Costa [id number] Date: 11/06/2016 Reason for consult: Initial assessment  Of this mom and term baby, now 4 hours old. Mom had recently fed the baby some formula. She is concerned that the baby is not latching due to the large size of her nipple. On exam, mom has large, but not extremely large nipples. Mom .will call for lactation and/or her nurse, to help with latch at next feeding.I  told mom her nipples may be fine for her baby.Mom Mom is also pumping with DEP, and spoon feeding colostrum to her baby. I did not get to do much teaching with mom, so this will need to be done at next consult.    Maternal Data    Feeding Feeding Type: Breast Milk with Formula added Nipple Type: Slow - flow  LATCH Score/Interventions       Type of Nipple:  (large, mom thinkd baby can not fit her nipple in her mouth)              Lactation Tools Discussed/Used Initiated by:: bedside Rn Date initiated:: 11/05/16   Consult Status Consult Status: Follow-up Date: 11/07/16 Follow-up type: In-patient    Tonna Corner 11/06/2016, 3:35 PM

## 2016-11-06 NOTE — Addendum Note (Signed)
Addendum  created 11/06/16 1848 by Jonna Munro, CRNA   Sign clinical note

## 2016-11-07 LAB — BIRTH TISSUE RECOVERY COLLECTION (PLACENTA DONATION)

## 2016-11-07 NOTE — Lactation Note (Signed)
This note was copied from a baby's chart. Lactation Consultation Note  Patient Name: Ashley Costa [id number]'E Date: 11/07/2016 Reason for consult: Follow-up assessment Baby at 48 hr of life. Upon entry baby was sleeping in the basinet. Mom stated she is trying to latch baby at every feeding but baby "can not get my big nipple so I just give her a bottle". Mom stated she has been pumping " a lot" and plans to offer expressed milk at the next feeding. Encouraged her to call for lactation at the next feeding, left phone number on the white board, and to pump 8+/24hr. She plans to go to [facility] tomorrow for the lactation consultant to help her latch the baby. She is aware of lactation services and support group. She will call as needed.   Maternal Data    Feeding Feeding Type: Breast Fed Nipple Type: Slow - flow  LATCH Score/Interventions                      Lactation Tools Discussed/Used     Consult Status Consult Status: PRN    Denzil Hughes 11/07/2016, 10:44 PM

## 2016-11-07 NOTE — Progress Notes (Addendum)
Subjective: Postpartum Day #2: Cesarean Delivery Patient reports incisional pain, tolerating PO and no problems voiding.  Able to eat, has advanced her diet.  Still working on breast feeding, is pumping currently.   Objective: Vital signs in last 24 hours: Temp:  [97.8 F (36.6 C)-98.3 F (36.8 C)] 98.1 F (36.7 C) (06/06 0540) Pulse Rate:  [56-84] 59 (06/06 0540) Resp:  [18-20] 20 (06/06 0540) BP: (92-126)/(53-70) 115/60 (06/06 0540) SpO2:  [97 %-98 %] 97 % (06/05 2155)  Physical Exam:  General: alert, cooperative and no distress Lochia: appropriate Uterine Fundus: firm Incision: no significant drainage, no dehiscence, no significant erythema DVT Evaluation: No evidence of DVT seen on physical exam. No cords or calf tenderness. No significant calf/ankle edema.   Recent Labs  11/04/16 0800 11/06/16 0514  HGB 11.9* 11.2*  HCT 33.7* 32.0*    Assessment/Plan: Status post Cesarean section. Doing well postoperatively.  Continue current care.  Plan for discharge home 11/08/16.    Morene Crocker, CNM 11/07/2016, 7:36 AM

## 2016-11-08 DIAGNOSIS — Z98891 History of uterine scar from previous surgery: Secondary | ICD-10-CM

## 2016-11-08 MED ORDER — OXYCODONE-ACETAMINOPHEN 5-325 MG PO TABS: 1.0000 | ORAL_TABLET | ORAL | 0 refills | Status: DC | PRN

## 2016-11-08 MED ORDER — IBUPROFEN 600 MG PO TABS: 600.0000 mg | ORAL_TABLET | Freq: Four times a day (QID) | ORAL | 0 refills | Status: DC

## 2016-11-08 MED ORDER — DOCUSATE SODIUM 100 MG PO CAPS: 100.0000 mg | ORAL_CAPSULE | Freq: Two times a day (BID) | ORAL | 0 refills | Status: DC

## 2016-11-08 NOTE — Lactation Note (Signed)
This note was copied from a baby's chart. Lactation Consultation Note: Mother reports that her milk started coming in last night. She reports that she pumped apprx 80 ml. Mother is currently giving infant ebm in a bottle. Mother reports that she is not able to get infant latched very deep. She reports that she feels her nipple is to large for infants mouth. Advised mother to continue to offer breast on cue and at least 8-12 feedings in 24 hours. Mother is active with [facility] and has an appt with WIC at 1 pm to get an electric pump. Mother was given a harmony hand pump with a #30 flange. Mother advised to continue to attempt to breastfeed infant. Mother was offered to schedule an outpatient appt with [facility] for continued assistance. Mother advised do good breast massage. Mother also offered to assist with latch. Infant has just taken a bottle of ebm. Mother very excited that she is producing milk . Mother is aware of available community support and [facility] office services.   Patient Name: Girl Tami Blass [id number] Date: 11/08/2016     Maternal Data    Feeding Feeding Type: Breast Milk with Formula added Nipple Type: Slow - flow  LATCH Score/Interventions                      Lactation Tools Discussed/Used     Consult Status      Darla Lesches 11/08/2016, 10:49 AM

## 2016-11-08 NOTE — Discharge Instructions (Signed)
Cesarean Delivery, Care After °Refer to this sheet in the next few weeks. These instructions provide you with information about caring for yourself after your procedure. Your health care provider may also give you more specific instructions. Your treatment has been planned according to current medical practices, but problems sometimes occur. Call your health care provider if you have any problems or questions after your procedure. °What can I expect after the procedure? °After the procedure, it is common to have: °· A small amount of blood or clear fluid coming from the incision. °· Some redness, swelling, and pain in your incision area. °· Some abdominal pain and soreness. °· Vaginal bleeding (lochia). °· Pelvic cramps. °· Fatigue. ° °Follow these instructions at home: °Incision care ° °· Follow instructions from your health care provider about how to take care of your incision. Make sure you: °? Wash your hands with soap and water before you change your bandage (dressing). If soap and water are not available, use hand sanitizer. °? Change your dressing as told by your health care provider. °? Leave stitches (sutures), skin staples, skin glue, or adhesive strips in place. These skin closures may need to stay in place for 2 weeks or longer. If adhesive strip edges start to loosen and curl up, you may trim the loose edges. Do not remove adhesive strips completely unless your health care provider tells you to do that. °· Check your incision area every day for signs of infection. Check for: °? More redness, swelling, or pain. °? More fluid or blood. °? Warmth. °? Pus or a bad smell. °· When you cough or sneeze, hug a pillow. This helps with pain and decreases the chance of your incision opening up (dehiscing). Do this until your incision heals. °Medicines °· Take over-the-counter and prescription medicines only as told by your health care provider. °· If you were prescribed an antibiotic medicine, take it as told by  your health care provider. Do not stop taking the antibiotic until it is finished. °Driving °· Do not drive or operate heavy machinery while taking prescription pain medicine. °· Do not drive for 24 hours if you received a sedative. °Lifestyle °· Do not drink alcohol. This is especially important if you are breastfeeding or taking pain medicine. °· Do not use tobacco products, including cigarettes, chewing tobacco, or e-cigarettes. If you need help quitting, ask your health care provider. Tobacco can delay wound healing. °Eating and drinking °· Drink at least 8 eight-ounce glasses of water every day unless told not to by your health care provider. If you breastfeed, you may need to drink more water than this. °· Eat high-fiber foods every day. These foods may help prevent or relieve constipation. High-fiber foods include: °? Whole grain cereals and breads. °? Brown rice. °? Beans. °? Fresh fruits and vegetables. °Activity °· Return to your normal activities as told by your health care provider. Ask your health care provider what activities are safe for you. °· Rest as much as possible. Try to rest or take a nap while your baby is sleeping. °· Do not lift anything that is heavier than your baby or 10 lb (4.5 kg) as told by your health care provider. °· Ask your health care provider when you can engage in sexual activity. This may depend on your: °? Risk of infection. °? Healing rate. °? Comfort and desire to engage in sexual activity. °Bathing °· Do not take baths, swim, or use a hot tub until your health care   provider approves. Ask your health care provider if you can take showers. You may only be allowed to take sponge baths until your incision heals.  Keep your dressing dry as told by your health care provider. General instructions  Do not use tampons or douches until your health care provider approves.  Wear: ? Loose, comfortable clothing. ? A supportive and well-fitting bra.  Watch for any blood clots  that may pass from your vagina. These may look like clumps of dark red, brown, or black discharge.  Keep your perineum clean and dry as told by your health care provider.  Wipe from front to back when you use the toilet.  If possible, have someone help you care for your baby and help with household activities for a few days after you leave the hospital.  Keep all follow-up visits for you and your baby as told by your health care provider. This is important. Contact a health care provider if:  You have: ? Bad-smelling vaginal discharge. ? Difficulty urinating. ? Pain when urinating. ? A sudden increase or decrease in the frequency of your bowel movements. ? More redness, swelling, or pain around your incision. ? More fluid or blood coming from your incision. ? Pus or a bad smell coming from your incision. ? A fever. ? A rash. ? Little or no interest in activities you used to enjoy. ? Questions about caring for yourself or your baby. ? Nausea.  Your incision feels warm to the touch.  Your breasts turn red or become painful or hard.  You feel unusually sad or worried.  You vomit.  You pass large blood clots from your vagina. If you pass a blood clot, save it to show to your health care provider. Do not flush blood clots down the toilet without showing your health care provider.  You urinate more than usual.  You are dizzy or light-headed.  You have not breastfed and have not had a menstrual period for 12 weeks after delivery.  You stopped breastfeeding and have not had a menstrual period for 12 weeks after stopping breastfeeding. Get help right away if:  You have: ? Pain that does not go away or get better with medicine. ? Chest pain. ? Difficulty breathing. ? Blurred vision or spots in your vision. ? Thoughts about hurting yourself or your baby. ? New pain in your abdomen or in one of your legs. ? A severe headache.  You faint.  You bleed from your vagina so much  that you fill two sanitary pads in one hour. This information is not intended to replace advice given to you by your health care provider. Make sure you discuss any questions you have with your health care provider. Document Released: 02/10/2002 Document Revised: 09/29/2015 Document Reviewed: 04/25/2015 Elsevier Interactive Patient Education  2017 [place] Breast Milk Breast milk is a living fluid that contains infection-fighting cells (antibodies). Pre-pumped (expressed) breast milk needs to be stored in a certain way so that it remains effective in protecting your baby against infections. The following guidelines are for storing breast milk for a healthy, full-term infant. How long can breast milk be stored?  Milk can be stored for up to 4 hours at room temperature, or 60-71F (15.6-19.4C). However, it is acceptable to allow the milk to sit for 6-8 hours if the pump parts and containers are well cleaned.  Milk can be stored for 3 days in a refrigerator at less than 102F (3.9C). However, it is  acceptable to allow the milk to sit for up to 8 days if the pump parts and containers are well cleaned.  Milk can be stored for 2 weeks in a freezer compartment inside a refrigerator.  Milk can be stored for 3-6 months in a freezer unit with a separate door.  Milk can be stored for 6-12 months in a deep freezer at -75F (-20C). A deep freezer is a chest or stand-alone freezer that is not opened very often and is kept at a colder temperature than a regular freezer. How should I store breast milk?  Milk may be stored in: ? A glass container. ? A hard, BPA-free plastic container. ? A plastic bag specially designed for storing breast milk. Many women like these because they take up less space than other containers and can be attached directly to the breast pump.  Store your milk in 2-4 oz. (60-120 mL) servings. This makes it easier to thaw the milk. It also helps you avoid having to throw  out milk that your baby does not drink.  Leave an inch or so at the top of the bag or bottle so the milk has room to expand as it freezes.  Label each container with the date and time the milk was pumped so that you use the milk in the order that it was pumped.  If you will be freezing the milk, freeze it within 24 hours of pumping. Store it in the back of the freezer to prevent the milk from being affected by temperature changes when the freezer door is opened. How do I thaw frozen breast milk?  Frozen milk can be thawed: ? In a refrigerator. ? Under warm, running tap water. ? In a pan of warm water that has been heated on the stove.  Do not heat milk directly on the stove or in the microwave. The milk will heat unevenly, and it may burn the baby. It will also destroy some of the milks infection-fighting properties.  Thawed milk can be stored in the refrigerator for up to 24 hours, but it should not be refrozen.  If you want to freeze freshly pumped milk along with milk that is already frozen, take these steps to prevent the fresh milk from thawing out the frozen milk: ? Chill the fresh milk in the refrigerator for 30 minutes before adding it to the frozen milk. ? Make sure that the amount of fresh milk you add is smaller than the amount of frozen milk. This information is not intended to replace advice given to you by your health care provider. Make sure you discuss any questions you have with your health care provider. Document Released: 03/18/2009 Document Revised: 01/19/2016 Document Reviewed: 12/21/2015 Elsevier Interactive Patient Education  [employer].

## 2016-11-08 NOTE — Discharge Summary (Signed)
OB Discharge Summary     Patient Name: Ashley Costa DOB: 12/17/1985 MRN: [id number]  Date of admission: 11/04/2016 Delivering MD: Aletha Halim   Date of discharge: 11/08/2016  Admitting diagnosis: 41wks induction  Intrauterine pregnancy: [redacted]w[redacted]d     Secondary diagnosis:  Active Problems:   Post term pregnancy at [redacted] weeks gestation   Status post primary low transverse cesarean section  Additional problems: None     Discharge diagnosis: Term Pregnancy Delivered                                                                                                Post partum procedures:None  Augmentation: AROM, Pitocin, Cytotec and Foley Balloon  Complications: None  Hospital course:  Induction of Labor With Cesarean Section  31 y.o. yo G1P1001 at [redacted]w[redacted]d was admitted to the hospital 11/04/2016 for induction of labor. Patient had a labor course significant for induction with cytotec, foley bulb, pitocin and AROM. Patient progressed to 4cm and baby began showing NRFHT with multiple variables and lates. The patient went for cesarean section due to Arrest of Dilation and Non-Reassuring FHR, and delivered a Viable infant,@BABYSUPPRESS (DBLINK,ept,110,,1,,) Membrane Rupture Time/Date: )4:33 AM ,11/05/2016   @Details  of operation can be found in separate operative Note.  Patient had an uncomplicated postpartum course. She is ambulating, tolerating a regular diet, passing flatus, and urinating well.  Patient is discharged home in stable condition on 11/08/16.                                    Physical exam  Vitals:   11/06/16 2155 11/07/16 0540 11/07/16 1747 11/08/16 0559  BP: (!) 124/59 115/60 (!) 117/56 (!) 115/57  Pulse: 62 (!) 59 69 98  Resp: 18 20 20 19   Temp: 97.8 F (36.6 C) 98.1 F (36.7 C) 97.5 F (36.4 C) 98.3 F (36.8 C)  TempSrc: Oral Oral Oral Oral  SpO2: 97%   99%  Weight:      Height:       General: alert, cooperative and no distress Lochia: appropriate Uterine Fundus:  firm Incision: Healing well with no significant drainage, No significant erythema, Dressing is clean, dry, and intact DVT Evaluation: No evidence of DVT seen on physical exam. Negative Homan's sign. No cords or calf tenderness. Labs: Lab Results  Component Value Date   WBC 16.8 (H) 11/06/2016   HGB 11.2 (L) 11/06/2016   HCT 32.0 (L) 11/06/2016   MCV 82.1 11/06/2016   PLT 298 11/06/2016   CMP Latest Ref Rng & Units 08/21/2012  Glucose 70 - 99 mg/dL 91  BUN 6 - 23 mg/dL 11  Creatinine 0.50 - 1.10 mg/dL 0.99  Sodium 135 - 145 mEq/L 139  Potassium 3.5 - 5.3 mEq/L 4.3  Chloride 96 - 112 mEq/L 105  CO2 19 - 32 mEq/L 25  Calcium 8.4 - 10.5 mg/dL 9.5  Total Protein 6.0 - 8.3 g/dL 7.2  Total Bilirubin 0.3 - 1.2 mg/dL 0.4  Alkaline Phos 39 - 117 U/L 47  AST 0 -  37 U/L 15  ALT 0 - 35 U/L 12    Discharge instruction: per After Visit Summary and "Baby and Me Booklet".  After visit meds:  Allergies as of 11/08/2016   No Known Allergies     Medication List    TAKE these medications   docusate sodium 100 MG capsule Commonly known as:  COLACE Take 1 capsule (100 mg total) by mouth 2 (two) times daily.   ibuprofen 600 MG tablet Commonly known as:  ADVIL,MOTRIN Take 1 tablet (600 mg total) by mouth every 6 (six) hours.   multivitamin-prenatal 27-0.8 MG Tabs tablet Take 1 tablet by mouth daily.   oxyCODONE-acetaminophen 5-325 MG tablet Commonly known as:  ROXICET Take 1-2 tablets by mouth every 4 (four) hours as needed for severe pain.       Diet: routine diet  Activity: Advance as tolerated. Pelvic rest for 6 weeks.   Outpatient follow up:6 weeks Follow up Appt:No future appointments. Follow up Visit:No Follow-up on file.  Postpartum contraception: IUD Mirena  Newborn Data: Live born female  Birth Weight: 6 lb 4.2 oz (2840 g) APGAR: 8, 9  Baby Feeding: Bottle and Breast Disposition:home with mother   11/08/2016 Katherine Basset, DO OB fellow

## 2016-11-13 ENCOUNTER — Telehealth: Payer: Self-pay

## 2016-11-13 NOTE — Telephone Encounter (Signed)
Pt states that her bandage got wet during her shower yesterday, and so she removed bandage. States that the wound is now red possibly due to friction from underwear rubbing against it overnight. Pt denies any drainage, no odor. She did see a little blood on her underwear. She was told to leave bandage on the wound for a week. Pt advised to watch the area for any increase in bleeding, drainage, odor, or increase in pain. If any of the above occurs, pt advised to call the office.

## 2016-11-15 ENCOUNTER — Telehealth: Payer: Self-pay

## 2016-11-15 ENCOUNTER — Encounter: Payer: Self-pay | Admitting: Obstetrics

## 2016-11-15 ENCOUNTER — Ambulatory Visit (INDEPENDENT_AMBULATORY_CARE_PROVIDER_SITE_OTHER): Payer: Medicaid Other | Admitting: Obstetrics

## 2016-11-15 DIAGNOSIS — R109 Unspecified abdominal pain: Secondary | ICD-10-CM

## 2016-11-15 DIAGNOSIS — Z3009 Encounter for other general counseling and advice on contraception: Secondary | ICD-10-CM

## 2016-11-15 DIAGNOSIS — G8918 Other acute postprocedural pain: Secondary | ICD-10-CM

## 2016-11-15 DIAGNOSIS — Z30011 Encounter for initial prescription of contraceptive pills: Secondary | ICD-10-CM

## 2016-11-15 DIAGNOSIS — R350 Frequency of micturition: Secondary | ICD-10-CM

## 2016-11-15 MED ORDER — IBUPROFEN 600 MG PO TABS: 600.0000 mg | ORAL_TABLET | Freq: Four times a day (QID) | ORAL | 5 refills | Status: DC | PRN

## 2016-11-15 MED ORDER — TRAMADOL HCL 50 MG PO TABS: 50.0000 mg | ORAL_TABLET | Freq: Four times a day (QID) | ORAL | 1 refills | Status: DC | PRN

## 2016-11-15 MED ORDER — NORETHINDRONE 0.35 MG PO TABS: 1.0000 | ORAL_TABLET | Freq: Every day | ORAL | 11 refills | Status: DC

## 2016-11-15 NOTE — Telephone Encounter (Signed)
Pt states that she is having a yellow/greenish discharge with a slight odor. States that her wound looks open a little. Pt transferred to front staff to schedule appt for incision check

## 2016-11-15 NOTE — Progress Notes (Signed)
Post Partum Exam  Ashley Costa is a 31 y.o. G24P1001 female who presents for a postpartum visit. She is 10 days postpartum following a low cervical transverse Cesarean section. I have fully reviewed the prenatal and intrapartum course. The delivery was at 43 gestational weeks.  Anesthesia: spinal. Postpartum course has been normal- except for incision pain and drainage. Baby's course has been normal. Baby is feeding by breast. Bleeding moderate lochia. Bowel function is abnormal: Constipation. Bladder function is abnormal: Some pressure and urgency. Patient is sexually active. Contraception method is abstinence. Postpartum depression screening:neg  The following portions of the patient's history were reviewed and updated as appropriate: allergies, current medications, past family history, past medical history, past social history, past surgical history and problem list.  Review of Systems A comprehensive review of systems was negative.    Objective:  unknown if currently breastfeeding.  General:  alert and no distress   Breasts:  inspection negative, no nipple discharge or bleeding, no masses or nodularity palpable  Lungs: clear to auscultation bilaterally  Heart:  regular rate and rhythm, S1, S2 normal, no murmur, click, rub or gallop  Abdomen: normal findings: soft, non-tender and clean, dry and intact   Vulva:  not evaluated  Vagina: not evaluated  Cervix:  not evaluated  Corpus: not examined  Adnexa:  not evaluated  Rectal Exam: Not performed.        Assessment:    1. Postpartum care following cesarean delivery - Doing well.  2. Postoperative abdominal pain Rx: - traMADol (ULTRAM) 50 MG tablet; Take 1 tablet (50 mg total) by mouth every 6 (six) hours as needed.  Dispense: 30 tablet; Refill: 1 - ibuprofen (ADVIL,MOTRIN) 600 MG tablet; Take 1 tablet (600 mg total) by mouth every 6 (six) hours as needed.  Dispense: 30 tablet; Refill: 5  3. Encounter for other general counseling  and advice on contraception Rx: - norethindrone (MICRONOR,CAMILA,ERRIN) 0.35 MG tablet; Take 1 tablet (0.35 mg total) by mouth daily.  Dispense: 1 Package; Refill: 11  4. Encounter for initial prescription of contraceptive pills Rx: - norethindrone (MICRONOR,CAMILA,ERRIN) 0.35 MG tablet; Take 1 tablet (0.35 mg total) by mouth daily.  Dispense: 1 Package; Refill: 11  5. Urinary frequency Rx: - Urine Culture   Plan:   1. Contraception: oral progesterone-only contraceptive 2. Micronor Rx 3. Follow up in: 2 weeks or as needed.

## 2016-11-17 LAB — URINE CULTURE

## 2016-12-03 ENCOUNTER — Ambulatory Visit (INDEPENDENT_AMBULATORY_CARE_PROVIDER_SITE_OTHER): Payer: Medicaid Other | Admitting: Obstetrics and Gynecology

## 2016-12-03 ENCOUNTER — Encounter: Payer: Self-pay | Admitting: Obstetrics and Gynecology

## 2016-12-03 NOTE — Progress Notes (Signed)
Post Partum Exam  Ashley Costa is a 31 y.o. G46P1001 female who presents for a postpartum visit. She is 4 weeks postpartum following a primary c-section due to failed IOL. I have fully reviewed the prenatal and intrapartum course. The delivery was at [redacted]w[redacted]d gestational weeks.  Anesthesia: epidural. Postpartum course has been unremarkable. Baby's course has been unremarkable Baby is feeding by pumping breast milk. . Bleeding started back bleeding yesterday with small clots.  Bowel function is constipation. . Bladder function is normal. Patient tried once but is was uncomfortable sexually active. Contraception method is oral progesterone-only contraceptive. Postpartum depression screening:neg. EPDS: 0     Review of Systems Pertinent items are noted in HPI.    Objective:  currently breastfeeding.  General:  alert, cooperative and no distress   Breasts:  inspection negative, no nipple discharge or bleeding, no masses or nodularity palpable  Lungs: clear to auscultation bilaterally  Heart:  regular rate and rhythm  Abdomen: soft, non-tender; bowel sounds normal; no masses,  no organomegaly and incision: no erythema, induration or drainage. Healing well   Vulva:  normal  Vagina: normal vagina, no discharge, exudate, lesion, or erythema  Cervix:  multiparous appearance  Corpus: normal size, contour, position, consistency, mobility, non-tender  Adnexa:  no mass, fullness, tenderness  Rectal Exam: Not performed.        Assessment:    Normal postpartum exam. Pap smear 04/17/16 Neg pap HPV detected.   Plan:   1. Contraception: oral progesterone-only contraceptive 2. Patient is medically cleared to return gradually to regular activity 3. Follow up in: 6 months or as needed.

## 2016-12-31 ENCOUNTER — Other Ambulatory Visit: Payer: Self-pay | Admitting: Obstetrics

## 2016-12-31 ENCOUNTER — Telehealth: Payer: Self-pay

## 2016-12-31 DIAGNOSIS — Z30011 Encounter for initial prescription of contraceptive pills: Secondary | ICD-10-CM

## 2016-12-31 MED ORDER — NORETHIN ACE-ETH ESTRAD-FE 1-20 MG-MCG(24) PO TABS: 1.0000 | ORAL_TABLET | Freq: Every day | ORAL | 11 refills | Status: DC

## 2016-12-31 NOTE — Telephone Encounter (Signed)
Loestrin 24 Fe Rx

## 2016-12-31 NOTE — Telephone Encounter (Signed)
Pt called stating that she would like to change her birth control from the mini pill to another pill. Pt is no longer breastfeeding.

## 2017-01-01 NOTE — Telephone Encounter (Signed)
Left detailed message informing pt on vm per dpr

## 2017-03-26 ENCOUNTER — Other Ambulatory Visit: Payer: Self-pay

## 2017-03-26 NOTE — Telephone Encounter (Signed)
LVM for pt to cb.

## 2017-03-27 NOTE — Telephone Encounter (Signed)
Pt requests rf on Tramadol to ease menstrual cramps. Pt aware I will consult with MD and c/b.

## 2017-03-29 NOTE — Telephone Encounter (Signed)
She needs to come to office for Rx for Tramadol

## 2017-03-29 NOTE — Telephone Encounter (Signed)
Office now closed. I was able to call Tramadol to [facility] on [address]. Pt is aware.

## 2017-04-01 ENCOUNTER — Telehealth: Payer: Self-pay | Admitting: Pediatrics

## 2017-04-01 NOTE — Telephone Encounter (Signed)
Pharmacy faxed requesting PA for Tramadol.  [place] PA approved# [id number]

## 2017-04-06 ENCOUNTER — Emergency Department (HOSPITAL_COMMUNITY)
Admission: EM | Admit: 2017-04-06 | Discharge: 2017-04-06 | Disposition: A | Discharge status: Home / Self Care | Payer: Medicaid Other | Attending: Emergency Medicine | Admitting: Emergency Medicine

## 2017-04-06 ENCOUNTER — Emergency Department (HOSPITAL_COMMUNITY): Payer: Medicaid Other

## 2017-04-06 ENCOUNTER — Encounter (HOSPITAL_COMMUNITY): Payer: Self-pay | Admitting: Emergency Medicine

## 2017-04-06 DIAGNOSIS — Y939 Activity, unspecified: Secondary | ICD-10-CM | POA: Insufficient documentation

## 2017-04-06 DIAGNOSIS — S93402A Sprain of unspecified ligament of left ankle, initial encounter: Secondary | ICD-10-CM | POA: Insufficient documentation

## 2017-04-06 DIAGNOSIS — W108XXA Fall (on) (from) other stairs and steps, initial encounter: Secondary | ICD-10-CM | POA: Diagnosis not present

## 2017-04-06 DIAGNOSIS — Y999 Unspecified external cause status: Secondary | ICD-10-CM | POA: Diagnosis not present

## 2017-04-06 DIAGNOSIS — Y929 Unspecified place or not applicable: Secondary | ICD-10-CM | POA: Insufficient documentation

## 2017-04-06 DIAGNOSIS — T1490XA Injury, unspecified, initial encounter: Secondary | ICD-10-CM

## 2017-04-06 DIAGNOSIS — S99912A Unspecified injury of left ankle, initial encounter: Secondary | ICD-10-CM | POA: Diagnosis present

## 2017-04-06 DIAGNOSIS — Z87891 Personal history of nicotine dependence: Secondary | ICD-10-CM | POA: Diagnosis not present

## 2017-04-06 MED ORDER — HYDROCODONE-ACETAMINOPHEN 5-325 MG PO TABS
2.0000 | ORAL_TABLET | Freq: Once | ORAL | Status: AC
  Administered 2017-04-06: 2 via ORAL
  Filled 2017-04-06: qty 2

## 2017-04-06 NOTE — ED Notes (Signed)
Bed: WLPT1 Expected date:  Expected time:  Means of arrival:  Comments: 

## 2017-04-06 NOTE — ED Triage Notes (Signed)
Patient complaining of left ankle injury. Patient was walking down some stairs. Patient missed a step. Patient twisted her ankle.

## 2017-04-06 NOTE — ED Notes (Signed)
Pt reported missing a step when walking down some stairs onset 20:00 11/2. She was on the third step from the bottom, and states that she twisted her ankle. Some swelling noted, but no obvious angulation. Ambulatory with a limp.

## 2017-04-06 NOTE — ED Provider Notes (Signed)
[facility] DEPT Provider Note   CSN: [id number] Arrival date & time: 04/06/17  0556     History   Chief Complaint Chief Complaint  Patient presents with  . Ankle Injury    HPI Ashley Costa is a 31 y.o. female who presents with left ankle pain that began at 8 PM last night after a mechanical fall. Patient reports that she was walking down the front steps when she missed the last step causing an eversion injury of her left ankle. Patient reports that she has been able to ambulate but reports worsening pain with ambulation and bearing weight. Patient reports that she took some previously prescribed pain medication night to help the pain. Patient reports that she woke up this morning and had returning of pain. Patient states that she has been elevating the leg with minimal improvement in symptoms. She reports some intermittent numbness to the foot after elevation but states that it completely resolved after she removes from elevation. Patient denies any weakness.  The history is provided by the patient.    Past Medical History:  Diagnosis Date  . Endometriosis 2015  . Smoker     Patient Active Problem List   Diagnosis Date Noted  . Status post primary low transverse cesarean section 11/08/2016  . Post term pregnancy at [redacted] weeks gestation 11/04/2016  . Obesity in pregnancy, antepartum, third trimester 09/04/2016  . Supervision of normal first pregnancy, antepartum 04/17/2016  . Pregnancy with history of infertility 04/17/2016  . Tobacco abuse 08/06/2012  . Obesity, Class III, BMI 40-49.9 (morbid obesity) ([place]) 08/06/2012    Past Surgical History:  Procedure Laterality Date  . CESAREAN SECTION N/A 11/05/2016   Procedure: CESAREAN SECTION;  Surgeon: Aletha Halim, MD;  Location: [facility];  Service: Obstetrics;  Laterality: N/A;  . TONSILECTOMY/ADENOIDECTOMY WITH MYRINGOTOMY    . Wisdom teeth abstracted      OB History    Gravida  Para Term Preterm AB Living   1 1 1     1    SAB TAB Ectopic Multiple Live Births         0 1       Home Medications    Prior to Admission medications   Medication Sig Start Date End Date Taking? Authorizing Provider  ibuprofen (ADVIL,MOTRIN) 600 MG tablet Take 1 tablet (600 mg total) by mouth every 6 (six) hours as needed. 11/15/16  Yes Shelly Bombard, MD  Norethindrone Acetate-Ethinyl Estrad-FE (LOESTRIN 24 FE) 1-20 MG-MCG(24) tablet Take 1 tablet by mouth daily. 12/31/16  Yes Shelly Bombard, MD  Prenatal Vit-Fe Fumarate-FA (MULTIVITAMIN-PRENATAL) 27-0.8 MG TABS tablet Take 1 tablet by mouth daily.    Yes [provider]  traMADol (ULTRAM) 50 MG tablet Take 1 tablet (50 mg total) by mouth every 6 (six) hours as needed. 11/15/16  Yes Shelly Bombard, MD    Family History Family History  Problem Relation Age of Onset  . Diabetes Father   . Hypertension Father   . Diabetes Mother   . Hypertension Mother     Social History Social History  Substance Use Topics  . Smoking status: Former Smoker    Types: Cigarettes    Quit date: 01/30/2016  . Smokeless tobacco: Never Used  . Alcohol use No     Allergies   Patient has no known allergies.   Review of Systems Review of Systems  Musculoskeletal:       Left ankle pain     Physical  Exam Updated Vital Signs BP 126/88 (BP Location: Right Arm)   Pulse 90   Temp 99.1 F (37.3 C) (Oral)   Resp 18   Ht 5\' 4"  (1.626 m)   Wt 106.1 kg (234 lb)   LMP 03/29/2017   SpO2 100%   BMI 40.17 kg/m   Physical Exam  Constitutional: She appears well-developed and well-nourished.  Sitting comfortably on examination table  HENT:  Head: Normocephalic and atraumatic.  Eyes: EOM are normal.  Neck: Normal range of motion.  Cardiovascular:  Pulses:      Dorsalis pedis pulses are 2+ on the right side, and 2+ on the left side.  Pulmonary/Chest: Effort normal.  Musculoskeletal:  Tenderness to palpation to the lateral  malleolus of the left ankle with some overlying soft tissue swelling. No overlying ecchymosis, warmth, or erythema. No deformity or crepitus noted. Limited plantarflexion and dorsiflexion secondary to patient's pain. She is able to move all 5 digits of left foot without any difficulty. No tenderness palpation to left distal tib-fib. No deformity or crepitus noted. No tenderness palpation of bilateral knees. No abnormalities of the right ankle.  Neurological:  Sensation intact throughout all major nerve distributions of the feet   Skin: Skin is warm and dry. Capillary refill takes less than 2 seconds.  The skin is intact to ankle/foot.  The foot is warm and well perfused with intact sensation  Nursing note and vitals reviewed.    ED Treatments / Results  Labs (all labs ordered are listed, but only abnormal results are displayed) Labs Reviewed - No data to display  EKG  EKG Interpretation None       Radiology Dg Ankle Complete Left  Result Date: 04/06/2017 CLINICAL DATA:  Pain after fall EXAM: LEFT ANKLE COMPLETE - 3+ VIEW COMPARISON:  None. FINDINGS: Lateral soft tissue swelling identified. No fractures or dislocations identified. The ankle mortise is intact. No other acute abnormalities. IMPRESSION: Lateral soft tissue swelling.  No fracture identified. Electronically Signed   By: Dorise Bullion III M.D   On: 04/06/2017 07:46    Procedures Procedures (including critical care time)  Medications Ordered in ED Medications  HYDROcodone-acetaminophen (NORCO/VICODIN) 5-325 MG per tablet 2 tablet (2 tablets Oral Given 04/06/17 0840)     Initial Impression / Assessment and Plan / ED Course  I have reviewed the triage vital signs and the nursing notes.  Pertinent labs & imaging results that were available during my care of the patient were reviewed by me and considered in my medical decision making (see chart for details).     31 y.o. F who presents with left ankle pain after a  mechanical fall consistent with an ankle sprain/strain.  Vital signs reviewed and stable. Patient is neurovascularly intact. Consider sprain vs fracture vs dislocation.  XRs ordered at triage. Analgesics provided in the department.  XR reviewed. Negative for any acute fracture or dislocation. Explained results to patient and discussed that there could be a ligamentous or muscular injury that cannot be picked up on XR. Plan to send patient home with a ASO splint and crutches. Conservative at home therapies discussed. Provided patient with a list of clinic resources to use if he does not have a PCP. Instructed to call them today to arrange follow-up in the next 24-48 hours. Strict return precautions discussed. Patient expresses understanding and agreement to plan.    Final Clinical Impressions(s) / ED Diagnoses   Final diagnoses:  Sprain of left ankle, unspecified ligament, initial encounter  New Prescriptions Discharge Medication List as of 04/06/2017  8:30 AM       Volanda Napoleon, PA-C 04/06/17 5072    Quintella Reichert, MD 04/08/17 [id number]

## 2017-04-06 NOTE — Discharge Instructions (Signed)
Follow up with your Primary Care Doctor as needed. If you do not have a primary care doctor, you can use one listed in the paperwork.   You can take Tylenol or Ibuprofen as directed for pain. You can alternate Tylenol and Ibuprofen every 4 hours. If you take Tylenol at 1pm, then you can take Ibuprofen at 5pm. Then you can take Tylenol again at 9pm.     Return to the Emergency Department immediately for any worsening pain, redness/swelling of the ankle, gray or blue color to the toes, numbness/weakness of toes or foot, difficulty walking or any other worsening or concerning symptoms.    Ankle sprain Ankle sprain occurs when the ligaments that hold the ankle joint to get her are stretched or torn. It may take 4-6 weeks to heal.  For activity: Use crutches with nonweightbearing for the first few days. Then, you may walk on your ankles as the pain allows, or as instructed. Start gradually with weight bearing on the affected ankle. Once you can walk pain free, then try jogging. When you can run forwards, then you can try moving side to side. If you cannot walk without crutches in one week, you need a recheck by your Family Doctor.  If you do not have a family doctor to followup with, you can see the list of phone numbers below. Please call today to make a followup appointment.   RICE therapy:  Routine Care for injuries  Rest, Ice, Compression, Elevation (RICE)  Rest is needed to allow your body to heal. Routine activities can be resumed when comfortable. Injury tendons and bones can take up to 6 weeks to heal. Tendons are cordlike structures that attach muscles and bones.  Ice following an injury helps keep the swelling down and reduce the pain. Put ice in a plastic bag. Place a towel between your skin and the bag of ice. Leave the ice on for 15-20 minutes, 3-4 times a day. Do this while awake, for the first 24-48 hours. After that continue as directed by your caregiver.  Compression helps keep  swelling down. It also gives support and helps with discomfort. If any lasting bandage has been applied, it should be removed and reapplied every 3-4 hours. It should not be applied tightly, but firmly enough to keep swelling down. Watch fingers or toes for swelling, discoloration, coldness, numbness or excessive pain. If any of these problems occur, removed the bandage and reapply loosely. Contact your caregiver if these problems continue.  Elevation helps reduce swelling and decrease your pain. With extremities such as the arms, hands, legs and feet, the injured area should be placed near or above the level of the heart if possible.

## 2017-04-06 NOTE — ED Notes (Signed)
Bed: WTR6 Expected date:  Expected time:  Means of arrival:  Comments: 

## 2017-04-22 ENCOUNTER — Ambulatory Visit: Payer: Medicaid Other | Admitting: Obstetrics

## 2017-04-22 ENCOUNTER — Encounter: Payer: Self-pay | Admitting: Obstetrics

## 2017-04-22 ENCOUNTER — Other Ambulatory Visit (HOSPITAL_COMMUNITY)
Admission: RE | Admit: 2017-04-22 | Discharge: 2017-04-22 | Disposition: A | Discharge status: Home / Self Care | Payer: Medicaid Other | Source: Ambulatory Visit | Attending: Obstetrics | Admitting: Obstetrics

## 2017-04-22 VITALS — HR 71 | Ht 64.0 in | Wt 248.4 lb

## 2017-04-22 DIAGNOSIS — Z87891 Personal history of nicotine dependence: Secondary | ICD-10-CM | POA: Insufficient documentation

## 2017-04-22 DIAGNOSIS — Z01419 Encounter for gynecological examination (general) (routine) without abnormal findings: Secondary | ICD-10-CM | POA: Diagnosis not present

## 2017-04-22 DIAGNOSIS — N926 Irregular menstruation, unspecified: Secondary | ICD-10-CM

## 2017-04-22 DIAGNOSIS — Z3202 Encounter for pregnancy test, result negative: Secondary | ICD-10-CM

## 2017-04-22 DIAGNOSIS — N946 Dysmenorrhea, unspecified: Secondary | ICD-10-CM

## 2017-04-22 DIAGNOSIS — Z0000 Encounter for general adult medical examination without abnormal findings: Secondary | ICD-10-CM

## 2017-04-22 DIAGNOSIS — R87610 Atypical squamous cells of undetermined significance on cytologic smear of cervix (ASC-US): Secondary | ICD-10-CM | POA: Insufficient documentation

## 2017-04-22 LAB — POCT URINE PREGNANCY: PREG TEST UR: NEGATIVE

## 2017-04-22 NOTE — Progress Notes (Signed)
Subjective:        Ashley Costa is a 31 y.o. female here for a routine exam.  Current complaints: None.    Personal health questionnaire:  Is patient Ashkenazi Jewish, have a family history of breast and/or ovarian cancer: no Is there a family history of uterine cancer diagnosed at age < 36, gastrointestinal cancer, urinary tract cancer, family member who is a Field seismologist syndrome-associated carrier: no Is the patient overweight and hypertensive, family history of diabetes, personal history of gestational diabetes, preeclampsia or PCOS: no Is patient over 18, have PCOS,  family history of premature CHD under age 29, diabetes, smoke, have hypertension or peripheral artery disease:  no At any time, has a partner hit, kicked or otherwise hurt or frightened you?: no Over the past 2 weeks, have you felt down, depressed or hopeless?: no Over the past 2 weeks, have you felt little interest or pleasure in doing things?:no   Gynecologic History Patient's last menstrual period was 03/29/2017 (exact date). Contraception: OCP (estrogen/progesterone) Last Pap: 2017. Results were: positive HPV Last mammogram: n/a. Results were: n/a  Obstetric History OB History  Gravida Para Term Preterm AB Living  1 1 1     1   SAB TAB Ectopic Multiple Live Births        0 1    # Outcome Date GA Lbr Len/2nd Weight Sex Delivery Anes PTL Lv  1 Term 11/05/16 [redacted]w[redacted]d  6 lb 4.2 oz (2.84 kg) F CS-LTranv EPI  LIV     Birth Comments: post term, slightly pale, term infant with light meconium in creases      Past Medical History:  Diagnosis Date  . Endometriosis 2015  . Smoker     Past Surgical History:  Procedure Laterality Date  . CESAREAN SECTION N/A 11/05/2016   Performed by Aletha Halim, MD at [facility]  . TONSILECTOMY/ADENOIDECTOMY WITH MYRINGOTOMY    . Wisdom teeth abstracted       Current Outpatient Medications:  .  ibuprofen (ADVIL,MOTRIN) 600 MG tablet, Take 1 tablet (600 mg total) by  mouth every 6 (six) hours as needed., Disp: 30 tablet, Rfl: 5 .  Norethindrone Acetate-Ethinyl Estrad-FE (LOESTRIN 24 FE) 1-20 MG-MCG(24) tablet, Take 1 tablet by mouth daily., Disp: 1 Package, Rfl: 11 .  traMADol (ULTRAM) 50 MG tablet, Take 1 tablet (50 mg total) by mouth every 6 (six) hours as needed., Disp: 30 tablet, Rfl: 1 .  Prenatal Vit-Fe Fumarate-FA (MULTIVITAMIN-PRENATAL) 27-0.8 MG TABS tablet, Take 1 tablet by mouth daily. , Disp: , Rfl:  No Known Allergies  Social History   Tobacco Use  . Smoking status: Former Smoker    Types: Cigarettes    Last attempt to quit: 01/30/2016    Years since quitting: 1.2  . Smokeless tobacco: Never Used  Substance Use Topics  . Alcohol use: No    Family History  Problem Relation Age of Onset  . Diabetes Father   . Hypertension Father   . Diabetes Mother   . Hypertension Mother       Review of Systems  Constitutional: negative for fatigue and weight loss Respiratory: negative for cough and wheezing Cardiovascular: negative for chest pain, fatigue and palpitations Gastrointestinal: negative for abdominal pain and change in bowel habits Musculoskeletal:negative for myalgias Neurological: negative for gait problems and tremors Behavioral/Psych: negative for abusive relationship, depression Endocrine: negative for temperature intolerance    Genitourinary:negative for abnormal menstrual periods, genital lesions, hot flashes, sexual problems and vaginal discharge Integument/breast: negative  for breast lump, breast tenderness, nipple discharge and skin lesion(s)    Objective:       Pulse 71   Ht 5\' 4"  (1.626 m)   Wt 248 lb 6.4 oz (112.7 kg)   LMP 03/29/2017 (Exact Date)   BMI 42.64 kg/m  General:   alert  Skin:   no rash or abnormalities  Lungs:   clear to auscultation bilaterally  Heart:   regular rate and rhythm, S1, S2 normal, no murmur, click, rub or gallop  Breasts:   normal without suspicious masses, skin or nipple changes  or axillary nodes  Abdomen:  normal findings: no organomegaly, soft, non-tender and no hernia  Pelvis:  External genitalia: normal general appearance Urinary system: urethral meatus normal and bladder without fullness, nontender Vaginal: normal without tenderness, induration or masses Cervix: normal appearance Adnexa: normal bimanual exam Uterus: anteverted and non-tender, normal size   Lab Review Urine pregnancy test Labs reviewed yes Radiologic studies reviewed no  50% of 20 min visit spent on counseling and coordination of care.    Assessment:     1. Encounter for routine gynecological examination with Papanicolaou smear of cervix Rx: - Cervicovaginal ancillary only - Cytology - PAP - Hepatitis B surface antigen - Hepatitis C antibody - HIV antibody - RPR - POCT urine pregnancy  2. Irregular menses - continue OCP's  3. Dysmenorrhea - Ibuprofen prn   Plan:    Education reviewed: calcium supplements, depression evaluation, low fat, low cholesterol diet, safe sex/STD prevention, self breast exams and weight bearing exercise. Contraception: OCP (estrogen/progesterone). Follow up in: 1 year.   No orders of the defined types were placed in this encounter.  No orders of the defined types were placed in this encounter.

## 2017-04-23 ENCOUNTER — Other Ambulatory Visit: Payer: Self-pay | Admitting: Obstetrics

## 2017-04-23 DIAGNOSIS — A5901 Trichomonal vulvovaginitis: Secondary | ICD-10-CM

## 2017-04-23 LAB — HIV ANTIBODY (ROUTINE TESTING W REFLEX): HIV Screen 4th Generation wRfx: NONREACTIVE

## 2017-04-23 LAB — CERVICOVAGINAL ANCILLARY ONLY
Bacterial vaginitis: NEGATIVE
CANDIDA VAGINITIS: NEGATIVE
CHLAMYDIA, DNA PROBE: NEGATIVE
NEISSERIA GONORRHEA: NEGATIVE
TRICH (WINDOWPATH): POSITIVE — AB

## 2017-04-23 LAB — RPR: RPR: NONREACTIVE

## 2017-04-23 LAB — HEPATITIS B SURFACE ANTIGEN: HEP B S AG: NEGATIVE

## 2017-04-23 LAB — HEPATITIS C ANTIBODY: Hep C Virus Ab: <0.1 s/co ratio (ref 0.0–0.9)

## 2017-04-23 MED ORDER — METRONIDAZOLE 500 MG PO TABS: 2000.0000 mg | ORAL_TABLET | Freq: Once | ORAL | 0 refills | Status: AC

## 2017-04-24 ENCOUNTER — Ambulatory Visit: Payer: Medicaid Other | Admitting: Podiatry

## 2017-04-24 ENCOUNTER — Other Ambulatory Visit: Payer: Self-pay | Admitting: Obstetrics

## 2017-04-24 ENCOUNTER — Telehealth: Payer: Self-pay

## 2017-04-24 ENCOUNTER — Encounter: Payer: Self-pay | Admitting: Podiatry

## 2017-04-24 VITALS — BP 148/97 | HR 79 | Resp 16

## 2017-04-24 DIAGNOSIS — L600 Ingrowing nail: Secondary | ICD-10-CM | POA: Diagnosis not present

## 2017-04-24 LAB — CYTOLOGY - PAP
Diagnosis: UNDETERMINED — AB
HPV 16/18/45 genotyping: NEGATIVE
HPV: DETECTED — AB

## 2017-04-24 MED ORDER — TERBINAFINE HCL 250 MG PO TABS: 250.0000 mg | ORAL_TABLET | Freq: Every day | ORAL | 0 refills | Status: DC

## 2017-04-24 NOTE — Telephone Encounter (Signed)
Pt informed. Pt will come by on Monday to pick up rx. Rx placed at front desk for pick up

## 2017-04-24 NOTE — Patient Instructions (Signed)

## 2017-04-24 NOTE — Telephone Encounter (Signed)
Patient has to come to office for printed refill.

## 2017-04-24 NOTE — Progress Notes (Signed)
   Subjective:    Patient ID: Ashley Costa, female    DOB: 1986/04/12, 31 y.o.   MRN: [id number]  HPI    Review of Systems  All other systems reviewed and are negative.      Objective:   Physical Exam        Assessment & Plan:

## 2017-04-24 NOTE — Telephone Encounter (Signed)
Pt called requesting refill for tramadol.

## 2017-04-26 NOTE — Progress Notes (Signed)
Subjective:    Patient ID: Ashley Costa, female   DOB: 31 y.o.   MRN: [id number]   HPI patient presents with irritation between her lesser digits right over left and also a painful second nail left with chronic irritation of the nail and history of having the big toenails removed in the past    Review of Systems  All other systems reviewed and are negative.       Objective:  Physical Exam  Constitutional: She appears well-developed and well-nourished.  Cardiovascular: Intact distal pulses.  Pulmonary/Chest: Effort normal.  Musculoskeletal: Normal range of motion.  Neurological: She is alert.  Skin: Skin is warm.  Nursing note and vitals reviewed.  neurovascular status intact muscle strength adequate range of motion within normal limits with patient noted to have irritation between the lesser digits bilateral and is noted to have incurvation of the second nail left with a whole nail being thick and painful and redness on the distal medial tip     Assessment:    Probability for fungal infection bilateral and also damaged second nail left with pain     Plan:   H&P conditions reviewed. Patient has had a baby is had liver function studies which were normal and was noted to have probable fungal infection along with damaged second nail left. At this point were to start her on Lamisil daily and I explained complications that can occur and patient will watch. She is not breast-feeding some no issue from that standpoint I also recommended removal of the second nail and today I explained procedure risk and she signed consent form. I infiltrated the left second digit 60 Milligan times like Marcaine mixture under sterile conditions I removed the nail exposed matrix and applied phenol 3 applications 30 seconds followed by alcohol lavage and sterile dressing. Gave instructions on soaks and reappoint

## 2017-05-08 ENCOUNTER — Other Ambulatory Visit: Payer: Self-pay | Admitting: Obstetrics

## 2017-05-08 DIAGNOSIS — R109 Unspecified abdominal pain: Principal | ICD-10-CM

## 2017-05-08 DIAGNOSIS — G8918 Other acute postprocedural pain: Secondary | ICD-10-CM

## 2017-05-08 MED ORDER — TRAMADOL HCL 50 MG PO TABS: 50.0000 mg | ORAL_TABLET | Freq: Four times a day (QID) | ORAL | 2 refills | Status: DC | PRN

## 2017-05-15 ENCOUNTER — Telehealth: Payer: Self-pay | Admitting: *Deleted

## 2017-05-15 NOTE — Telephone Encounter (Signed)
Pt states she has pain in her foot. 05/15/2017-Left message informing pt that if she was continuing to have a problem with her ingrown toenail procedure of 04/24/2017 or a new problem like pain in her foot, to call and make an appt, or if question call again.

## 2017-05-16 NOTE — Telephone Encounter (Signed)
I informed pt that if she was continuing to have pain from a toenail procedure 04/24/2017 that was not relieved by OTC medications she would need an appt. Pt states understanding and I transferred to schedulers.

## 2017-05-20 ENCOUNTER — Ambulatory Visit: Payer: Medicaid Other | Admitting: Podiatry

## 2017-05-20 ENCOUNTER — Encounter: Payer: Self-pay | Admitting: Podiatry

## 2017-05-20 VITALS — BP 131/73 | HR 78 | Resp 16

## 2017-05-20 DIAGNOSIS — L03032 Cellulitis of left toe: Secondary | ICD-10-CM | POA: Diagnosis not present

## 2017-05-20 DIAGNOSIS — L600 Ingrowing nail: Secondary | ICD-10-CM

## 2017-05-20 MED ORDER — CEPHALEXIN 500 MG PO CAPS: 500.0000 mg | ORAL_CAPSULE | Freq: Three times a day (TID) | ORAL | 1 refills | Status: DC

## 2017-05-20 MED ORDER — METHYLPREDNISOLONE 4 MG PO TBPK: ORAL_TABLET | ORAL | 0 refills | Status: DC

## 2017-05-21 NOTE — Progress Notes (Signed)
Subjective:   Patient ID: Ashley Costa, female   DOB: 31 y.o.   MRN: [id number]   HPI Patient points stating that the left second toe has still been bothering her and she is noted some yellow drainage recently   ROS      Objective:  Physical Exam  Neurovascular status intact with nail site left second toe crusted and healing well with slight redness and irritation no active drainage and no proximal edema erythema or drainage noted.     Assessment:  Possibility for localized paronychia with no indication of proximal infection or issues     Plan:  H&P condition reviewed and placed on antibiotic cephalexin 500 mg 3 times daily instructed on soaks and reappoint for [place] to recheck again as needed in the next several weeks.  Gave strict instructions of any redness swelling should occur to let us know immediately

## 2017-07-11 ENCOUNTER — Ambulatory Visit (HOSPITAL_COMMUNITY)
Admission: EM | Admit: 2017-07-11 | Discharge: 2017-07-11 | Disposition: A | Discharge status: Home / Self Care | Payer: Medicaid Other | Attending: Family Medicine | Admitting: Family Medicine

## 2017-07-11 ENCOUNTER — Encounter (HOSPITAL_COMMUNITY): Payer: Self-pay | Admitting: Family Medicine

## 2017-07-11 DIAGNOSIS — H1031 Unspecified acute conjunctivitis, right eye: Secondary | ICD-10-CM | POA: Diagnosis not present

## 2017-07-11 MED ORDER — OLOPATADINE HCL 0.1 % OP SOLN: 1.0000 [drp] | Freq: Two times a day (BID) | OPHTHALMIC | 0 refills | Status: DC

## 2017-07-11 MED ORDER — TOBRAMYCIN 0.3 % OP SOLN: 1.0000 [drp] | Freq: Four times a day (QID) | OPHTHALMIC | 0 refills | Status: DC

## 2017-07-11 NOTE — ED Triage Notes (Signed)
Pt here for redness to right eye x 2 days. sts itching and now the other eye is itching. sts that she has been using visine.

## 2017-07-11 NOTE — ED Provider Notes (Signed)
  [place]   [id number] 07/11/17 Arrival Time: 0947  ASSESSMENT & PLAN:  1. Acute conjunctivitis of right eye, unspecified acute conjunctivitis type     Meds ordered this encounter  Medications  . tobramycin (TOBREX) 0.3 % ophthalmic solution    Sig: Place 1 drop into the right eye every 6 (six) hours.    Dispense:  5 mL    Refill:  0  . olopatadine (PATANOL) 0.1 % ophthalmic solution    Sig: Place 1 drop into the right eye 2 (two) times daily.    Dispense:  5 mL    Refill:  0   Will f/u if not improving over the next few days. Work note given.  Reviewed expectations re: course of current medical issues. Questions answered. Outlined signs and symptoms indicating need for more acute intervention. Patient verbalized understanding. After Visit Summary given.   SUBJECTIVE:  Ashley Costa is a 32 y.o. female who presents with complaint of persistent "redness and gritty feeling" of R eye.  Onset gradual, approximately 2 days ago. Injury: no. Visual changes: no. Contact lens use: no. Self treatment: Visine without much help. No specific eye pain reported. Some itching. Mild cold symptoms recently.  ROS: As per HPI.  OBJECTIVE:  Vitals:   07/11/17 1532 07/11/17 1533  BP:  133/85  Pulse: 72   Resp: 18   Temp: 98.2 F (36.8 C)   SpO2: 98%     General appearance: alert; no distress Eyes: OD with conjunctival injection; watery drainage; EOMI; no ciliary flush Neck: supple Lungs: clear to auscultation bilaterally Heart: regular rate and rhythm Skin: warm and dry Psychological: alert and cooperative; normal mood and affect  No Known Allergies  Past Medical History:  Diagnosis Date  . Endometriosis 2015  . Smoker    Social History   Socioeconomic History  . Marital status: Married    Spouse name: Not on file  . Number of children: Not on file  . Years of education: Not on file  . Highest education level: Not on file  Social Needs  . Financial  resource strain: Not on file  . Food insecurity - worry: Not on file  . Food insecurity - inability: Not on file  . Transportation needs - medical: Not on file  . Transportation needs - non-medical: Not on file  Occupational History  . Not on file  Tobacco Use  . Smoking status: Former Smoker    Types: Cigarettes    Last attempt to quit: 01/30/2016    Years since quitting: 1.4  . Smokeless tobacco: Never Used  Substance and Sexual Activity  . Alcohol use: No  . Drug use: No  . Sexual activity: Yes    Birth control/protection: OCP  Other Topics Concern  . Not on file  Social History Narrative  . Not on file   Family History  Problem Relation Age of Onset  . Diabetes Father   . Hypertension Father   . Diabetes Mother   . Hypertension Mother    Past Surgical History:  Procedure Laterality Date  . CESAREAN SECTION N/A 11/05/2016   Procedure: CESAREAN SECTION;  Surgeon: Aletha Halim, MD;  Location: [facility];  Service: Obstetrics;  Laterality: N/A;  . TONSILECTOMY/ADENOIDECTOMY WITH MYRINGOTOMY    . Wisdom teeth abstracted       Vanessa Kick, MD 07/11/17 [phone]

## 2018-05-09 ENCOUNTER — Other Ambulatory Visit: Payer: Self-pay

## 2018-05-09 ENCOUNTER — Encounter (HOSPITAL_COMMUNITY): Payer: Self-pay

## 2018-05-09 ENCOUNTER — Inpatient Hospital Stay (HOSPITAL_COMMUNITY)
Admission: AD | Admit: 2018-05-09 | Discharge: 2018-05-09 | Disposition: A | Discharge status: Home / Self Care | Payer: Medicaid Other | Source: Ambulatory Visit | Attending: Obstetrics and Gynecology | Admitting: Obstetrics and Gynecology

## 2018-05-09 ENCOUNTER — Inpatient Hospital Stay (HOSPITAL_COMMUNITY): Payer: Medicaid Other

## 2018-05-09 DIAGNOSIS — O26891 Other specified pregnancy related conditions, first trimester: Secondary | ICD-10-CM | POA: Diagnosis not present

## 2018-05-09 DIAGNOSIS — K5900 Constipation, unspecified: Secondary | ICD-10-CM

## 2018-05-09 DIAGNOSIS — Z87891 Personal history of nicotine dependence: Secondary | ICD-10-CM | POA: Diagnosis not present

## 2018-05-09 DIAGNOSIS — R109 Unspecified abdominal pain: Secondary | ICD-10-CM | POA: Diagnosis not present

## 2018-05-09 DIAGNOSIS — Z3A01 Less than 8 weeks gestation of pregnancy: Secondary | ICD-10-CM | POA: Insufficient documentation

## 2018-05-09 DIAGNOSIS — R110 Nausea: Secondary | ICD-10-CM | POA: Diagnosis present

## 2018-05-09 DIAGNOSIS — Z79899 Other long term (current) drug therapy: Secondary | ICD-10-CM | POA: Insufficient documentation

## 2018-05-09 DIAGNOSIS — O99611 Diseases of the digestive system complicating pregnancy, first trimester: Secondary | ICD-10-CM | POA: Diagnosis not present

## 2018-05-09 DIAGNOSIS — Z3490 Encounter for supervision of normal pregnancy, unspecified, unspecified trimester: Secondary | ICD-10-CM

## 2018-05-09 DIAGNOSIS — O26899 Other specified pregnancy related conditions, unspecified trimester: Secondary | ICD-10-CM

## 2018-05-09 DIAGNOSIS — R1030 Lower abdominal pain, unspecified: Secondary | ICD-10-CM | POA: Diagnosis not present

## 2018-05-09 HISTORY — DX: Unspecified abnormal cytological findings in specimens from vagina: R87.629

## 2018-05-09 LAB — URINALYSIS, ROUTINE W REFLEX MICROSCOPIC
BILIRUBIN URINE: NEGATIVE
Glucose, UA: NEGATIVE mg/dL
Hgb urine dipstick: NEGATIVE
Ketones, ur: 5 mg/dL — AB
Leukocytes, UA: NEGATIVE
NITRITE: NEGATIVE
PH: 5 (ref 5.0–8.0)
Protein, ur: NEGATIVE mg/dL
SPECIFIC GRAVITY, URINE: 1.011 (ref 1.005–1.030)

## 2018-05-09 LAB — CBC
HEMATOCRIT: 38.4 % (ref 36.0–46.0)
HEMOGLOBIN: 13.1 g/dL (ref 12.0–15.0)
MCH: 27.6 pg (ref 26.0–34.0)
MCHC: 34.1 g/dL (ref 30.0–36.0)
MCV: 81 fL (ref 80.0–100.0)
Platelets: 386 10*3/uL (ref 150–400)
RBC: 4.74 MIL/uL (ref 3.87–5.11)
RDW: 13.8 % (ref 11.5–15.5)
WBC: 9.2 10*3/uL (ref 4.0–10.5)
nRBC: 0 % (ref 0.0–0.2)

## 2018-05-09 LAB — WET PREP, GENITAL
SPERM: NONE SEEN
TRICH WET PREP: NONE SEEN
YEAST WET PREP: NONE SEEN

## 2018-05-09 LAB — POCT PREGNANCY, URINE: Preg Test, Ur: POSITIVE — AB

## 2018-05-09 LAB — HCG, QUANTITATIVE, PREGNANCY: hCG, Beta Chain, Quant, S: 2921 m[IU]/mL — ABNORMAL HIGH (ref <5)

## 2018-05-09 NOTE — Discharge Instructions (Signed)
Morning Sickness Morning sickness is when you feel sick to your stomach (nauseous) during pregnancy. You may feel sick to your stomach and throw up (vomit). You may feel sick in the morning, but you can feel this way any time of day. Some women feel very sick to their stomach and cannot stop throwing up (hyperemesis gravidarum). Follow these instructions at home:  Only take medicines as told by your doctor.  Take multivitamins as told by your doctor. Taking multivitamins before getting pregnant can stop or lessen the harshness of morning sickness.  Eat dry toast or unsalted crackers before getting out of bed.  Eat 5 to 6 small meals a day.  Eat dry and bland foods like rice and baked potatoes.  Do not drink liquids with meals. Drink between meals.  Do not eat greasy, fatty, or spicy foods.  Have someone cook for you if the smell of food causes you to feel sick or throw up.  If you feel sick to your stomach after taking prenatal vitamins, take them at night or with a snack.  Eat protein when you need a snack (nuts, yogurt, cheese).  Eat unsweetened gelatins for dessert.  Wear a bracelet used for sea sickness (acupressure wristband).  Go to a doctor that puts thin needles into certain body points (acupuncture) to improve how you feel.  Do not smoke.  Use a humidifier to keep the air in your house free of odors.  Get lots of fresh air. Contact a doctor if:  You need medicine to feel better.  You feel dizzy or lightheaded.  You are losing weight. Get help right away if:  You feel very sick to your stomach and cannot stop throwing up.  You pass out (faint). This information is not intended to replace advice given to you by your health care provider. Make sure you discuss any questions you have with your health care provider. Document Released: 06/28/2004 Document Revised: 10/27/2015 Document Reviewed: 11/05/2012 Elsevier Interactive Patient Education  2017 Elsevier  Inc.   Constipation, Adult Constipation is when a person:  Poops (has a bowel movement) fewer times in a week than normal.  Has a hard time pooping.  Has poop that is dry, hard, or bigger than normal.  Follow these instructions at home: Eating and drinking   Eat foods that have a lot of fiber, such as: ? Fresh fruits and vegetables. ? Whole grains. ? Beans.  Eat less of foods that are high in fat, low in fiber, or overly processed, such as: ? [place] fries. ? Hamburgers. ? Cookies. ? Candy. ? Soda.  Drink enough fluid to keep your pee (urine) clear or pale yellow. General instructions  Exercise regularly or as told by your doctor.  Go to the restroom when you feel like you need to poop. Do not hold it in.  Take over-the-counter and prescription medicines only as told by your doctor. These include any fiber supplements.  Do pelvic floor retraining exercises, such as: ? Doing deep breathing while relaxing your lower belly (abdomen). ? Relaxing your pelvic floor while pooping.  Watch your condition for any changes.  Keep all follow-up visits as told by your doctor. This is important. Contact a doctor if:  You have pain that gets worse.  You have a fever.  You have not pooped for 4 days.  You throw up (vomit).  You are not hungry.  You lose weight.  You are bleeding from the anus.  You have thin, pencil-like poop (  stool). Get help right away if:  You have a fever, and your symptoms suddenly get worse.  You leak poop or have blood in your poop.  Your belly feels hard or bigger than normal (is bloated).  You have very bad belly pain.  You feel dizzy or you faint. This information is not intended to replace advice given to you by your health care provider. Make sure you discuss any questions you have with your health care provider. Document Released: 11/07/2007 Document Revised: 12/09/2015 Document Reviewed: 11/09/2015 Elsevier Interactive Patient  Education  2018 [employer].   Abdominal Pain During Pregnancy Belly (abdominal) pain is common during pregnancy. Most of the time, it is not a serious problem. Other times, it can be a sign that something is wrong with the pregnancy. Always tell your doctor if you have belly pain. Follow these instructions at home: Monitor your belly pain for any changes. The following actions may help you feel better:  Do not have sex (intercourse) or put anything in your vagina until you feel better.  Rest until your pain stops.  Drink clear fluids if you feel sick to your stomach (nauseous). Do not eat solid food until you feel better.  Only take medicine as told by your doctor.  Keep all doctor visits as told.  Get help right away if:  You are bleeding, leaking fluid, or pieces of tissue come out of your vagina.  You have more pain or cramping.  You keep throwing up (vomiting).  You have pain when you pee (urinate) or have blood in your pee.  You have a fever.  You do not feel your baby moving as much.  You feel very weak or feel like passing out.  You have trouble breathing, with or without belly pain.  You have a very bad headache and belly pain.  You have fluid leaking from your vagina and belly pain.  You keep having watery poop (diarrhea).  Your belly pain does not go away after resting, or the pain gets worse. This information is not intended to replace advice given to you by your health care provider. Make sure you discuss any questions you have with your health care provider. Document Released: 05/09/2009 Document Revised: 12/28/2015 Document Reviewed: 12/18/2012 Elsevier Interactive Patient Education  [employer].

## 2018-05-09 NOTE — MAU Provider Note (Signed)
History     CSN: [id number]  Arrival date and time: 05/09/18 1011   First Provider Initiated Contact with Patient 05/09/18 1047      Chief Complaint  Patient presents with  . Possible Pregnancy  . Nausea  . Abdominal Pain   G2P1001 @[redacted]w[redacted]d  by LMP here with lower abdominal cramping. Sx started 3-4 days ago. She took Tylenol but didn't help. Denies urinary sx. No VB or vaginal discharge. Having some nausea no vomiting. Reports constipation, had small BM last night.    OB History    Gravida  2   Para  1   Term  1   Preterm      AB      Living  1     SAB      TAB      Ectopic      Multiple  0   Live Births  1           Past Medical History:  Diagnosis Date  . Endometriosis 2015  . Smoker   . Vaginal Pap smear, abnormal     Past Surgical History:  Procedure Laterality Date  . CESAREAN SECTION N/A 11/05/2016   Procedure: CESAREAN SECTION;  Surgeon: Aletha Halim, MD;  Location: [facility];  Service: Obstetrics;  Laterality: N/A;  . TONSILECTOMY/ADENOIDECTOMY WITH MYRINGOTOMY    . Wisdom teeth abstracted      Family History  Problem Relation Age of Onset  . Diabetes Father   . Hypertension Father   . Diabetes Mother   . Hypertension Mother     Social History   Tobacco Use  . Smoking status: Former Smoker    Packs/day: 0.25    Types: Cigarettes  . Smokeless tobacco: Never Used  Substance Use Topics  . Alcohol use: Yes    Comment: occasionally  . Drug use: No    Allergies: No Known Allergies  Medications Prior to Admission  Medication Sig Dispense Refill Last Dose  . acetaminophen (TYLENOL) 325 MG tablet Take 650 mg by mouth every 6 (six) hours as needed for mild pain.   05/09/2018 at Unknown time  . tobramycin (TOBREX) 0.3 % ophthalmic solution Place 1 drop into the right eye every 6 (six) hours. (Patient not taking: Reported on 05/09/2018) 5 mL 0 Not Taking at Unknown time    Review of Systems  Constitutional: Negative for  chills and fever.  Gastrointestinal: Positive for abdominal pain, constipation and nausea. Negative for vomiting.  Genitourinary: Negative for dysuria, vaginal bleeding and vaginal discharge.   Physical Exam   Blood pressure 124/70, pulse 85, temperature 98.7 F (37.1 C), temperature source Oral, resp. rate 18, height 5\' 4"  (1.626 m), weight 122.5 kg, last menstrual period 03/30/2018, SpO2 99 %, currently breastfeeding.  Physical Exam  Constitutional: She is oriented to person, place, and time. She appears well-developed and well-nourished. No distress.  HENT:  Head: Normocephalic and atraumatic.  Neck: Normal range of motion.  Cardiovascular: Normal rate.  Respiratory: Effort normal. No respiratory distress.  GI: Soft. She exhibits no distension and no mass. There is no tenderness. There is no rebound and no guarding.  Genitourinary:  Genitourinary Comments: External: no lesions or erythema Vagina: rugated, pink, moist, thin white discharge Uterus: non enlarged, anteverted, non tender, no CMT Adnexae: no masses, no tenderness left, no tenderness right Cervix nml   Musculoskeletal: Normal range of motion.  Neurological: She is alert and oriented to person, place, and time.  Skin: Skin is warm and  dry.  Psychiatric: She has a normal mood and affect.   Results for orders placed or performed during the hospital encounter of 05/09/18 (from the past 24 hour(s))  Urinalysis, Routine w reflex microscopic     Status: Abnormal   Collection Time: 05/09/18 10:26 AM  Result Value Ref Range   Color, Urine YELLOW YELLOW   APPearance CLEAR CLEAR   Specific Gravity, Urine 1.011 1.005 - 1.030   pH 5.0 5.0 - 8.0   Glucose, UA NEGATIVE NEGATIVE mg/dL   Hgb urine dipstick NEGATIVE NEGATIVE   Bilirubin Urine NEGATIVE NEGATIVE   Ketones, ur 5 (A) NEGATIVE mg/dL   Protein, ur NEGATIVE NEGATIVE mg/dL   Nitrite NEGATIVE NEGATIVE   Leukocytes, UA NEGATIVE NEGATIVE  Pregnancy, urine POC     Status:  Abnormal   Collection Time: 05/09/18 10:32 AM  Result Value Ref Range   Preg Test, Ur POSITIVE (A) NEGATIVE  CBC     Status: None   Collection Time: 05/09/18 11:11 AM  Result Value Ref Range   WBC 9.2 4.0 - 10.5 K/uL   RBC 4.74 3.87 - 5.11 MIL/uL   Hemoglobin 13.1 12.0 - 15.0 g/dL   HCT 38.4 36.0 - 46.0 %   MCV 81.0 80.0 - 100.0 fL   MCH 27.6 26.0 - 34.0 pg   MCHC 34.1 30.0 - 36.0 g/dL   RDW 13.8 11.5 - 15.5 %   Platelets 386 150 - 400 K/uL   nRBC 0.0 0.0 - 0.2 %  hCG, quantitative, pregnancy     Status: Abnormal   Collection Time: 05/09/18 11:11 AM  Result Value Ref Range   hCG, Beta Chain, Quant, S 2,921 (H) <5 mIU/mL  Wet prep, genital     Status: Abnormal   Collection Time: 05/09/18 11:13 AM  Result Value Ref Range   Yeast Wet Prep HPF POC NONE SEEN NONE SEEN   Trich, Wet Prep NONE SEEN NONE SEEN   Clue Cells Wet Prep HPF POC PRESENT (A) NONE SEEN   WBC, Wet Prep HPF POC FEW (A) NONE SEEN   Sperm NONE SEEN    US Ob Less Than 14 Weeks With Ob Transvaginal  Result Date: 05/09/2018 CLINICAL DATA:  Pregnant, abdominal pain EXAM: OBSTETRIC <14 WK [place] AND TRANSVAGINAL OB US TECHNIQUE: Both transabdominal and transvaginal ultrasound examinations were performed for complete evaluation of the gestation as well as the maternal uterus, adnexal regions, and pelvic cul-de-sac. Transvaginal technique was performed to assess early pregnancy. COMPARISON:  None. FINDINGS: Intrauterine gestational sac: Single Yolk sac:  Not Visualized. Embryo:  Not Visualized. Cardiac Activity: Not Visualized. MSD: 6.4 mm   5 w   2  d Subchorionic hemorrhage:  None visualized. Maternal uterus/adnexae: Bilateral ovaries are within normal limits, noting a right corpus luteal cyst. Trace pelvic fluid. IMPRESSION: Single intrauterine gestational sac with yolk sac, measuring 5 weeks 2 days by mean sac diameter. No fetal pole is visualized likely related to early gestation. Consider follow-up pelvic ultrasound in 14 days  to confirm viability, as clinically warranted. Electronically Signed   By: Julian Hy M.D.   On: 05/09/2018 13:52    MAU Course  Procedures  MDM Labs and [place] ordered and reviewed. Early pregnancy on [place]. Discussed treatment of constipation. Stable for discharge home.  Assessment and Plan   1. Early stage of pregnancy   2. Abdominal pain in pregnancy   3. Constipation, unspecified constipation type    Discharge home Follow up with OB provider of choice to  start care in 5 weeks SAB precautions  Allergies as of 05/09/2018   No Known Allergies     Medication List    TAKE these medications   acetaminophen 325 MG tablet Commonly known as:  TYLENOL Take 650 mg by mouth every 6 (six) hours as needed for mild pain.   tobramycin 0.3 % ophthalmic solution Commonly known as:  TOBREX Place 1 drop into the right eye every 6 (six) hours.       Julianne Handler, CNM 05/09/2018, 1:56 PM

## 2018-05-09 NOTE — MAU Note (Signed)
Pt states she notices intermittent abdm cramping when at rest x 3-4 days. Denies spotting & has taken tylenol which has been intermittently helpful.  Intermittent nausea noted without vomiting x 1week; has not taken meds.

## 2018-05-12 LAB — GC/CHLAMYDIA PROBE AMP (~~LOC~~) NOT AT ARMC
CHLAMYDIA, DNA PROBE: NEGATIVE
Neisseria Gonorrhea: NEGATIVE

## 2018-06-09 ENCOUNTER — Ambulatory Visit: Payer: Medicaid Other | Admitting: Podiatry

## 2018-06-13 ENCOUNTER — Other Ambulatory Visit: Payer: Self-pay | Admitting: Obstetrics

## 2018-06-13 ENCOUNTER — Other Ambulatory Visit (HOSPITAL_COMMUNITY)
Admission: RE | Admit: 2018-06-13 | Discharge: 2018-06-13 | Disposition: A | Discharge status: Home / Self Care | Payer: Medicaid Other | Source: Ambulatory Visit | Attending: Obstetrics | Admitting: Obstetrics

## 2018-06-13 ENCOUNTER — Ambulatory Visit (INDEPENDENT_AMBULATORY_CARE_PROVIDER_SITE_OTHER): Payer: Medicaid Other | Admitting: Obstetrics

## 2018-06-13 ENCOUNTER — Other Ambulatory Visit: Payer: Self-pay

## 2018-06-13 ENCOUNTER — Encounter: Payer: Self-pay | Admitting: Obstetrics

## 2018-06-13 DIAGNOSIS — Z3480 Encounter for supervision of other normal pregnancy, unspecified trimester: Secondary | ICD-10-CM

## 2018-06-13 DIAGNOSIS — Z23 Encounter for immunization: Secondary | ICD-10-CM

## 2018-06-13 DIAGNOSIS — Z3481 Encounter for supervision of other normal pregnancy, first trimester: Secondary | ICD-10-CM | POA: Diagnosis not present

## 2018-06-13 DIAGNOSIS — O0990 Supervision of high risk pregnancy, unspecified, unspecified trimester: Secondary | ICD-10-CM | POA: Insufficient documentation

## 2018-06-13 DIAGNOSIS — O99211 Obesity complicating pregnancy, first trimester: Secondary | ICD-10-CM

## 2018-06-13 DIAGNOSIS — O99210 Obesity complicating pregnancy, unspecified trimester: Secondary | ICD-10-CM

## 2018-06-13 MED ORDER — VITAFOL ULTRA 29-0.6-0.4-200 MG PO CAPS: 1.0000 | ORAL_CAPSULE | Freq: Every day | ORAL | 4 refills | Status: DC

## 2018-06-13 NOTE — Progress Notes (Signed)
New OB.  Reports no complaints today.  FLU vaccine given in RD, tolerated well.

## 2018-06-13 NOTE — Progress Notes (Signed)
Subjective:    Ashley Costa is being seen today for her first obstetrical visit.  This is a planned pregnancy. She is at 101w5d gestation. Her obstetrical history is significant for obesity. Relationship with FOB: significant other, living together. Patient does intend to breast feed. Pregnancy history fully reviewed.  The information documented in the HPI was reviewed and verified.  Menstrual History: OB History    Gravida  2   Para  1   Term  1   Preterm      AB      Living  1     SAB      TAB      Ectopic      Multiple  0   Live Births  1            Patient's last menstrual period was 03/30/2018 (exact date).    Past Medical History:  Diagnosis Date  . Endometriosis 2015  . Smoker   . Vaginal Pap smear, abnormal     Past Surgical History:  Procedure Laterality Date  . CESAREAN SECTION N/A 11/05/2016   Procedure: CESAREAN SECTION;  Surgeon: Aletha Halim, MD;  Location: [facility];  Service: Obstetrics;  Laterality: N/A;  . TONSILECTOMY/ADENOIDECTOMY WITH MYRINGOTOMY    . Wisdom teeth abstracted      (Not in a hospital admission)  No Known Allergies  Social History   Tobacco Use  . Smoking status: Former Smoker    Packs/day: 0.25    Types: Cigarettes  . Smokeless tobacco: Never Used  Substance Use Topics  . Alcohol use: Yes    Comment: occasionally    Family History  Problem Relation Age of Onset  . Diabetes Father   . Hypertension Father   . Diabetes Mother   . Hypertension Mother      Review of Systems Constitutional: negative for weight loss Gastrointestinal: negative for vomiting Genitourinary:negative for genital lesions and vaginal discharge and dysuria Musculoskeletal:negative for back pain Behavioral/Psych: negative for abusive relationship, depression, illegal drug usage and tobacco use    Objective:    LMP 03/30/2018 (Exact Date)  General Appearance:    Alert, cooperative, no distress, appears stated age   Head:    Normocephalic, without obvious abnormality, atraumatic  Eyes:    PERRL, conjunctiva/corneas clear, EOM's intact, fundi    benign, both eyes  Ears:    Normal TM's and external ear canals, both ears  Nose:   Nares normal, septum midline, mucosa normal, no drainage    or sinus tenderness  Throat:   Lips, mucosa, and tongue normal; teeth and gums normal  Neck:   Supple, symmetrical, trachea midline, no adenopathy;    thyroid:  no enlargement/tenderness/nodules; no carotid   bruit or JVD  Back:     Symmetric, no curvature, ROM normal, no CVA tenderness  Lungs:     Clear to auscultation bilaterally, respirations unlabored  Chest Wall:    No tenderness or deformity   Heart:    Regular rate and rhythm, S1 and S2 normal, no murmur, rub   or gallop  Breast Exam:    No tenderness, masses, or nipple abnormality  Abdomen:     Soft, non-tender, bowel sounds active all four quadrants,    no masses, no organomegaly  Genitalia:    Normal female without lesion, discharge or tenderness  Extremities:   Extremities normal, atraumatic, no cyanosis or edema  Pulses:   2+ and symmetric all extremities  Skin:   Skin color, texture,  turgor normal, no rashes or lesions  Lymph nodes:   Cervical, supraclavicular, and axillary nodes normal  Neurologic:   CNII-XII intact, normal strength, sensation and reflexes    throughout      Lab Review Urine pregnancy test Labs reviewed yes Radiologic studies reviewed yes  Assessment:    Pregnancy at [redacted]w[redacted]d weeks    Plan:     1. Supervision of other normal pregnancy, antepartum Rx: - Cytology - PAP( [facility]) - Enroll Patient in Babyscripts - Obstetric Panel, Including HIV - Hemoglobinopathy evaluation - Culture, OB Urine - Flu Vaccine QUAD 36+ mos IM (Fluarix, Quad PF) - SMN1 COPY NUMBER ANALYSIS (SMA Carrier Screen) - Prenat-Fe Poly-Methfol-FA-DHA (VITAFOL ULTRA) 29-0.6-0.4-200 MG CAPS; Take 1 capsule by mouth daily before breakfast.  Dispense: 90  capsule; Refill: 4 - Cervicovaginal ancillary only  2. Obesity affecting pregnancy, antepartum   Prenatal vitamins.  Counseling provided regarding continued use of seat belts, cessation of alcohol consumption, smoking or use of illicit drugs; infection precautions i.e., influenza/TDAP immunizations, toxoplasmosis,CMV, parvovirus, listeria and varicella; workplace safety, exercise during pregnancy; routine dental care, safe medications, sexual activity, hot tubs, saunas, pools, travel, caffeine use, fish and methlymercury, potential toxins, hair treatments, varicose veins Weight gain recommendations per IOM guidelines reviewed: underweight/BMI< 18.5--> gain 28 - 40 lbs; normal weight/BMI 18.5 - 24.9--> gain 25 - 35 lbs; overweight/BMI 25 - 29.9--> gain 15 - 25 lbs; obese/BMI >30->gain  11 - 20 lbs Problem list reviewed and updated. FIRST/CF mutation testing/NIPT/QUAD SCREEN/fragile X/Ashkenazi Jewish population testing/Spinal muscular atrophy discussed: requested. Role of ultrasound in pregnancy discussed; fetal survey: requested. Amniocentesis discussed: not indicated. VBAC calculator score: VBAC consent form provided Meds ordered this encounter  Medications  . Prenat-Fe Poly-Methfol-FA-DHA (VITAFOL ULTRA) 29-0.6-0.4-200 MG CAPS    Sig: Take 1 capsule by mouth daily before breakfast.    Dispense:  90 capsule    Refill:  4   Orders Placed This Encounter  Procedures  . Culture, OB Urine  . Flu Vaccine QUAD 36+ mos IM (Fluarix, Quad PF)  . Obstetric Panel, Including HIV  . Hemoglobinopathy evaluation  . SMN1 COPY NUMBER ANALYSIS (SMA Carrier Screen)    Follow up in 5 weeks. 50% of 20 min visit spent on counseling and coordination of care.     Shelly Bombard MD 06-13-2018

## 2018-06-15 LAB — CULTURE, OB URINE

## 2018-06-15 LAB — URINE CULTURE, OB REFLEX: ORGANISM ID, BACTERIA: NO GROWTH

## 2018-06-16 LAB — CERVICOVAGINAL ANCILLARY ONLY
Bacterial vaginitis: POSITIVE — AB
Candida vaginitis: NEGATIVE
Chlamydia: NEGATIVE
Neisseria Gonorrhea: NEGATIVE
TRICH (WINDOWPATH): POSITIVE — AB

## 2018-06-17 ENCOUNTER — Other Ambulatory Visit: Payer: Self-pay | Admitting: Obstetrics

## 2018-06-17 DIAGNOSIS — N760 Acute vaginitis: Principal | ICD-10-CM

## 2018-06-17 DIAGNOSIS — B9689 Other specified bacterial agents as the cause of diseases classified elsewhere: Secondary | ICD-10-CM

## 2018-06-17 DIAGNOSIS — A5901 Trichomonal vulvovaginitis: Secondary | ICD-10-CM

## 2018-06-17 MED ORDER — METRONIDAZOLE 500 MG PO TABS: 500.0000 mg | ORAL_TABLET | Freq: Two times a day (BID) | ORAL | 0 refills | Status: DC

## 2018-06-20 LAB — CYTOLOGY - PAP
Diagnosis: HIGH — AB
HPV (WINDOPATH): DETECTED — AB
HPV 16/18/45 genotyping: NEGATIVE

## 2018-06-24 LAB — OBSTETRIC PANEL, INCLUDING HIV
ANTIBODY SCREEN: NEGATIVE
BASOS: 0 %
Basophils Absolute: 0 10*3/uL (ref 0.0–0.2)
EOS (ABSOLUTE): 0.1 10*3/uL (ref 0.0–0.4)
Eos: 1 %
HEMATOCRIT: 37.6 % (ref 34.0–46.6)
HEMOGLOBIN: 12.4 g/dL (ref 11.1–15.9)
HEP B S AG: NEGATIVE
HIV Screen 4th Generation wRfx: NONREACTIVE
IMMATURE GRANS (ABS): 0 10*3/uL (ref 0.0–0.1)
IMMATURE GRANULOCYTES: 0 %
LYMPHS ABS: 2.8 10*3/uL (ref 0.7–3.1)
LYMPHS: 28 %
MCH: 26.8 pg (ref 26.6–33.0)
MCHC: 33 g/dL (ref 31.5–35.7)
MCV: 81 fL (ref 79–97)
MONOS ABS: 0.8 10*3/uL (ref 0.1–0.9)
Monocytes: 8 %
Neutrophils Absolute: 6.4 10*3/uL (ref 1.4–7.0)
Neutrophils: 63 %
Platelets: 423 10*3/uL (ref 150–450)
RBC: 4.63 x10E6/uL (ref 3.77–5.28)
RDW: 14.3 % (ref 11.7–15.4)
RPR: NONREACTIVE
Rh Factor: POSITIVE
Rubella Antibodies, IGG: 1.29 index (ref >0.99)
WBC: 10.1 10*3/uL (ref 3.4–10.8)

## 2018-06-24 LAB — SMN1 COPY NUMBER ANALYSIS (SMA CARRIER SCREENING)

## 2018-06-24 LAB — HEMOGLOBINOPATHY EVALUATION
HGB C: 0 %
HGB S: 37.9 % — ABNORMAL HIGH
HGB VARIANT: 0 %
Hemoglobin A2 Quantitation: 3.4 % — ABNORMAL HIGH (ref 1.8–3.2)
Hemoglobin F Quantitation: 0.6 % (ref 0.0–2.0)
Hgb A: 58.1 % — ABNORMAL LOW (ref 96.4–98.8)

## 2018-07-02 ENCOUNTER — Telehealth: Payer: Self-pay

## 2018-07-02 NOTE — Telephone Encounter (Signed)
Contacted pt and advised of results and need for colpo consult, pt agreed.

## 2018-07-18 ENCOUNTER — Encounter: Payer: Self-pay | Admitting: Obstetrics

## 2018-07-18 ENCOUNTER — Ambulatory Visit (INDEPENDENT_AMBULATORY_CARE_PROVIDER_SITE_OTHER): Payer: Medicaid Other | Admitting: Obstetrics

## 2018-07-18 VITALS — BP 121/75 | HR 91 | Wt 261.4 lb

## 2018-07-18 DIAGNOSIS — Z3A15 15 weeks gestation of pregnancy: Secondary | ICD-10-CM

## 2018-07-18 DIAGNOSIS — K5901 Slow transit constipation: Secondary | ICD-10-CM

## 2018-07-18 DIAGNOSIS — Z3480 Encounter for supervision of other normal pregnancy, unspecified trimester: Secondary | ICD-10-CM

## 2018-07-18 DIAGNOSIS — Z3482 Encounter for supervision of other normal pregnancy, second trimester: Secondary | ICD-10-CM

## 2018-07-18 LAB — PLEASE NOTE:

## 2018-07-18 LAB — INFLUENZA A AND B
Influenza A Ag, EIA: NEGATIVE
Influenza B Ag, EIA: NEGATIVE

## 2018-07-18 MED ORDER — DOCUSATE SODIUM 100 MG PO CAPS: 100.0000 mg | ORAL_CAPSULE | Freq: Two times a day (BID) | ORAL | 5 refills | Status: DC

## 2018-07-18 NOTE — Addendum Note (Signed)
Addended by: Maryruth Eve on: 07/18/2018 11:05 AM   Modules accepted: Orders

## 2018-07-18 NOTE — Progress Notes (Signed)
Subjective:  Ashley Costa is a 33 y.o. G2P1001 at [redacted]w[redacted]d being seen today for ongoing prenatal care.  She is currently monitored for the following issues for this low-risk pregnancy and has Tobacco abuse; Obesity, Class III, BMI 40-49.9 (morbid obesity) ([place]); Supervision of normal first pregnancy, antepartum; Pregnancy with history of infertility; Obesity in pregnancy, antepartum, third trimester; Post term pregnancy at [redacted] weeks gestation; Status post primary low transverse cesarean section; and Supervision of other normal pregnancy, antepartum on their problem list.  Patient reports congestion.  Contractions: Not present. Vag. Bleeding: None.  Movement: Present. Denies leaking of fluid.   The following portions of the patient's history were reviewed and updated as appropriate: allergies, current medications, past family history, past medical history, past social history, past surgical history and problem list. Problem list updated.  Objective:   Vitals:   07/18/18 1023  BP: 121/75  Pulse: 91  Weight: 261 lb 6.4 oz (118.6 kg)    Fetal Status: Fetal Heart Rate (bpm): 155   Movement: Present     General:  Alert, oriented and cooperative. Patient is in no acute distress.  Skin: Skin is warm and dry. No rash noted.   Cardiovascular: Normal heart rate noted  Respiratory: Normal respiratory effort, no problems with respiration noted  Abdomen: Soft, gravid, appropriate for gestational age. Pain/Pressure: Absent     Pelvic:  Cervical exam deferred        Extremities: Normal range of motion.  Edema: None  Mental Status: Normal mood and affect. Normal behavior. Normal judgment and thought content.   Urinalysis:      Assessment and Plan:  Pregnancy: G2P1001 at [redacted]w[redacted]d  1. Supervision of other normal pregnancy, antepartum Rx: - Genetic Screening - AFP, Serum, Open Spina Bifida - [place] MFM OB DETAIL +14 WK; Future  2. Constipation by delayed colonic transit Rx: - docusate sodium (COLACE)  100 MG capsule; Take 1 capsule (100 mg total) by mouth 2 (two) times daily.  Dispense: 60 capsule; Refill: 5  Preterm labor symptoms and general obstetric precautions including but not limited to vaginal bleeding, contractions, leaking of fluid and fetal movement were reviewed in detail with the patient. Please refer to After Visit Summary for other counseling recommendations.  Return in about 4 weeks (around 08/15/2018) for ROB.   Shelly Bombard, MD

## 2018-07-18 NOTE — Progress Notes (Signed)
Pt presents for ROB/AFP/Panorama.  She c/o head and nose pressure and needs advise on what to take.

## 2018-07-22 LAB — AFP, SERUM, OPEN SPINA BIFIDA
AFP MoM: 0.9
AFP VALUE AFPOSL: 22.8 ng/mL
Gest. Age on Collection Date: 15.7 weeks
Maternal Age At EDD: 33 yr
OSBR Risk 1 IN: 10000
Test Results:: NEGATIVE
Weight: 261 [lb_av]

## 2018-07-28 ENCOUNTER — Encounter: Payer: Self-pay | Admitting: Obstetrics

## 2018-08-12 ENCOUNTER — Ambulatory Visit (HOSPITAL_COMMUNITY): Payer: Medicaid Other

## 2018-08-12 ENCOUNTER — Ambulatory Visit (HOSPITAL_COMMUNITY)
Admission: RE | Admit: 2018-08-12 | Discharge: 2018-08-12 | Disposition: A | Discharge status: Home / Self Care | Payer: Medicaid Other | Source: Ambulatory Visit | Attending: Obstetrics | Admitting: Obstetrics

## 2018-08-12 DIAGNOSIS — Z3A19 19 weeks gestation of pregnancy: Secondary | ICD-10-CM

## 2018-08-12 DIAGNOSIS — Z363 Encounter for antenatal screening for malformations: Secondary | ICD-10-CM

## 2018-08-12 DIAGNOSIS — O99212 Obesity complicating pregnancy, second trimester: Secondary | ICD-10-CM

## 2018-08-12 DIAGNOSIS — Z3480 Encounter for supervision of other normal pregnancy, unspecified trimester: Secondary | ICD-10-CM | POA: Insufficient documentation

## 2018-08-13 ENCOUNTER — Other Ambulatory Visit (HOSPITAL_COMMUNITY): Payer: Self-pay | Admitting: *Deleted

## 2018-08-13 DIAGNOSIS — O99212 Obesity complicating pregnancy, second trimester: Secondary | ICD-10-CM

## 2018-08-15 ENCOUNTER — Encounter: Payer: Self-pay | Admitting: Obstetrics

## 2018-08-15 ENCOUNTER — Other Ambulatory Visit: Payer: Self-pay

## 2018-08-15 ENCOUNTER — Ambulatory Visit (INDEPENDENT_AMBULATORY_CARE_PROVIDER_SITE_OTHER): Payer: Medicaid Other | Admitting: Obstetrics

## 2018-08-15 DIAGNOSIS — Z3A19 19 weeks gestation of pregnancy: Secondary | ICD-10-CM

## 2018-08-15 DIAGNOSIS — Z3482 Encounter for supervision of other normal pregnancy, second trimester: Secondary | ICD-10-CM

## 2018-08-15 DIAGNOSIS — Z3480 Encounter for supervision of other normal pregnancy, unspecified trimester: Secondary | ICD-10-CM

## 2018-08-15 NOTE — Progress Notes (Signed)
Subjective:  Quatisha Zylka is a 33 y.o. G2P1001 at [redacted]w[redacted]d being seen today for ongoing prenatal care.  She is currently monitored for the following issues for this low-risk pregnancy and has Tobacco abuse; Obesity, Class III, BMI 40-49.9 (morbid obesity) ([place]); Supervision of normal first pregnancy, antepartum; Pregnancy with history of infertility; Obesity in pregnancy, antepartum, third trimester; Post term pregnancy at [redacted] weeks gestation; Status post primary low transverse cesarean section; and Supervision of other normal pregnancy, antepartum on their problem list.  Patient reports no complaints.  Contractions: Not present. Vag. Bleeding: None.  Movement: Present. Denies leaking of fluid.   The following portions of the patient's history were reviewed and updated as appropriate: allergies, current medications, past family history, past medical history, past social history, past surgical history and problem list. Problem list updated.  Objective:   Vitals:   08/15/18 1023  BP: 129/83  Pulse: 76  Weight: 262 lb (118.8 kg)    Fetal Status:     Movement: Present     General:  Alert, oriented and cooperative. Patient is in no acute distress.  Skin: Skin is warm and dry. No rash noted.   Cardiovascular: Normal heart rate noted  Respiratory: Normal respiratory effort, no problems with respiration noted  Abdomen: Soft, gravid, appropriate for gestational age. Pain/Pressure: Absent     Pelvic:  Cervical exam deferred        Extremities: Normal range of motion.  Edema: None  Mental Status: Normal mood and affect. Normal behavior. Normal judgment and thought content.   Urinalysis:      Assessment and Plan:  Pregnancy: G2P1001 at [redacted]w[redacted]d  1. Supervision of other normal pregnancy, antepartum   Preterm labor symptoms and general obstetric precautions including but not limited to vaginal bleeding, contractions, leaking of fluid and fetal movement were reviewed in detail with the  patient. Please refer to After Visit Summary for other counseling recommendations.  Return in about 4 weeks (around 09/12/2018) for [place].   Shelly Bombard, MD

## 2018-09-09 ENCOUNTER — Ambulatory Visit (HOSPITAL_COMMUNITY): Payer: Medicaid Other

## 2018-09-11 ENCOUNTER — Ambulatory Visit (INDEPENDENT_AMBULATORY_CARE_PROVIDER_SITE_OTHER): Payer: Medicaid Other | Admitting: Obstetrics

## 2018-09-11 ENCOUNTER — Other Ambulatory Visit: Payer: Self-pay

## 2018-09-11 ENCOUNTER — Encounter: Payer: Self-pay | Admitting: Obstetrics

## 2018-09-11 DIAGNOSIS — Z3482 Encounter for supervision of other normal pregnancy, second trimester: Secondary | ICD-10-CM

## 2018-09-11 DIAGNOSIS — Z3A23 23 weeks gestation of pregnancy: Secondary | ICD-10-CM | POA: Diagnosis not present

## 2018-09-11 DIAGNOSIS — Z3480 Encounter for supervision of other normal pregnancy, unspecified trimester: Secondary | ICD-10-CM

## 2018-09-11 MED ORDER — PRENATE MINI 29-0.6-0.4-350 MG PO CAPS: 1.0000 | ORAL_CAPSULE | Freq: Every day | ORAL | 4 refills | Status: DC

## 2018-09-11 NOTE — Progress Notes (Signed)
S/w pt via tele visit. Pt reports good fetal movement, denies pain. Patient states that she will try and purchase a BP cuff

## 2018-09-11 NOTE — Progress Notes (Signed)
   TELEHEALTH VIRTUAL OBSTETRICS VISIT ENCOUNTER NOTE  I connected with Ashley Costa on 09/11/18 at 10:00 AM EDT by telephone at home and verified that I am speaking with the correct person using two identifiers.   I discussed the limitations, risks, security and privacy concerns of performing an evaluation and management service by telephone and the availability of in person appointments. I also discussed with the patient that there may be a patient responsible charge related to this service. The patient expressed understanding and agreed to proceed.  Subjective:  Ashley Costa is a 33 y.o. G2P1001 at [redacted]w[redacted]d being followed for ongoing prenatal care.  She is currently monitored for the following issues for this low-risk pregnancy and has Tobacco abuse; Obesity, Class III, BMI 40-49.9 (morbid obesity) ([place]); Supervision of normal first pregnancy, antepartum; Pregnancy with history of infertility; Obesity in pregnancy, antepartum, third trimester; Status post primary low transverse cesarean section; and Supervision of other normal pregnancy, antepartum on their problem list.  Patient reports no complaints. Reports fetal movement. Denies any contractions, bleeding or leaking of fluid.   The following portions of the patient's history were reviewed and updated as appropriate: allergies, current medications, past family history, past medical history, past social history, past surgical history and problem list.   Objective:   General:  Alert, oriented and cooperative.   Mental Status: Normal mood and affect perceived. Normal judgment and thought content.  Rest of physical exam deferred due to type of encounter  Assessment and Plan:  Pregnancy: G2P1001 at [redacted]w[redacted]d 1. Supervision of other normal pregnancy, antepartum Rx: - Babyscripts Schedule Optimization - Prenat w/o A-FeCbn-Meth-FA-DHA (PRENATE MINI) 29-0.6-0.4-350 MG CAPS; Take 1 capsule by mouth daily before breakfast.  Dispense: 90 capsule;  Refill: 4  Preterm labor symptoms and general obstetric precautions including but not limited to vaginal bleeding, contractions, leaking of fluid and fetal movement were reviewed in detail with the patient.  I discussed the assessment and treatment plan with the patient. The patient was provided an opportunity to ask questions and all were answered. The patient agreed with the plan and demonstrated an understanding of the instructions. The patient was advised to call back or seek an in-person office evaluation/go to MAU at [facility] for any urgent or concerning symptoms. Please refer to After Visit Summary for other counseling recommendations.   I provided 8 minutes of non-face-to-face time during this encounter.  Return in about 4 weeks (around 10/09/2018) for ROB.  , 2 hour OGTT.  Future Appointments  Date Time Provider [place]  10/14/2018  3:30 PM WH-MFC [place] 1 WH-MFCUS MFC-US    Charles Harper, MD [facility] [place] [facility] 09-10-2018

## 2018-10-09 ENCOUNTER — Encounter: Payer: Self-pay | Admitting: Certified Nurse Midwife

## 2018-10-09 ENCOUNTER — Other Ambulatory Visit: Payer: Self-pay

## 2018-10-09 ENCOUNTER — Ambulatory Visit (INDEPENDENT_AMBULATORY_CARE_PROVIDER_SITE_OTHER): Payer: Medicaid Other | Admitting: Certified Nurse Midwife

## 2018-10-09 ENCOUNTER — Other Ambulatory Visit: Payer: Medicaid Other

## 2018-10-09 ENCOUNTER — Encounter: Payer: Medicaid Other | Admitting: Obstetrics

## 2018-10-09 VITALS — BP 115/77 | HR 82 | Wt 264.6 lb

## 2018-10-09 DIAGNOSIS — O99210 Obesity complicating pregnancy, unspecified trimester: Secondary | ICD-10-CM

## 2018-10-09 DIAGNOSIS — Z3480 Encounter for supervision of other normal pregnancy, unspecified trimester: Secondary | ICD-10-CM

## 2018-10-09 DIAGNOSIS — Z3A27 27 weeks gestation of pregnancy: Secondary | ICD-10-CM

## 2018-10-09 DIAGNOSIS — O99212 Obesity complicating pregnancy, second trimester: Secondary | ICD-10-CM

## 2018-10-09 DIAGNOSIS — Z23 Encounter for immunization: Secondary | ICD-10-CM | POA: Diagnosis not present

## 2018-10-09 DIAGNOSIS — Z98891 History of uterine scar from previous surgery: Secondary | ICD-10-CM | POA: Insufficient documentation

## 2018-10-09 NOTE — Patient Instructions (Signed)
Cesarean Delivery  Cesarean birth, or cesarean delivery, is the surgical delivery of a baby through an incision in the abdomen and the uterus. This may be referred to as a C-section. This procedure may be scheduled ahead of time, or it may be done in an emergency situation.  Tell a health care provider about:   Any allergies you have.   All medicines you are taking, including vitamins, herbs, eye drops, creams, and over-the-counter medicines.   Any problems you or family members have had with anesthetic medicines.   Any blood disorders you have.   Any surgeries you have had.   Any medical conditions you have.   Whether you or any members of your family have a history of deep vein thrombosis (DVT) or pulmonary embolism (PE).  What are the risks?  Generally, this is a safe procedure. However, problems may occur, including:   Infection.   Bleeding.   Allergic reactions to medicines.   Damage to other structures or organs.   Blood clots.   Injury to your baby.  What happens before the procedure?  General instructions   Follow instructions from your health care provider about eating or drinking restrictions.   If you know that you are going to have a cesarean delivery, do not shave your pubic area. Shaving before the procedure may increase your risk of infection.   Plan to have someone take you home from the hospital.   Ask your health care provider what steps will be taken to prevent infection. These may include:  ? Removing hair at the surgery site.  ? Washing skin with a germ-killing soap.  ? Taking antibiotic medicine.   Depending on the reason for your cesarean delivery, you may have a physical exam or additional testing, such as an ultrasound.   You may have your blood or urine tested.  Questions for your health care provider   Ask your health care provider about:  ? Changing or stopping your regular medicines. This is especially important if you are taking diabetes medicines or blood  thinners.  ? Your pain management plan. This is especially important if you plan to breastfeed your baby.  ? How long you will be in the hospital after the procedure.  ? Any concerns you may have about receiving blood products, if you need them during the procedure.  ? Cord blood banking, if you plan to collect your baby's umbilical cord blood.   You may also want to ask your health care provider:  ? Whether you will be able to hold or breastfeed your baby while you are still in the operating room.  ? Whether your baby can stay with you immediately after the procedure and during your recovery.  ? Whether a family member or a person of your choice can go with you into the operating room and stay with you during the procedure, immediately after the procedure, and during your recovery.  What happens during the procedure?     An IV will be inserted into one of your veins.   Fluid and medicines, such as antibiotics, will be given before the surgery.   Fetal monitors will be placed on your abdomen to check your baby's heart rate.   You may be given a special warming gown to wear to keep your temperature stable.   A catheter may be inserted into your bladder through your urethra. This drains your urine during the procedure.   You may be given one or more of   the following:  ? A medicine to numb the area (local anesthetic).  ? A medicine to make you fall asleep (general anesthetic).  ? A medicine (regional anesthetic) that is injected into your back or through a small thin tube placed in your back (spinal anesthetic or epidural anesthetic). This numbs everything below the injection site and allows you to stay awake during your procedure. If this makes you feel nauseous, tell your health care provider. Medicines will be available to help reduce any nausea you may feel.   An incision will be made in your abdomen, and then in your uterus.   If you are awake during your procedure, you may feel tugging and pulling in  your abdomen, but you should not feel pain. If you feel pain, tell your health care provider immediately.   Your baby will be removed from your uterus. You may feel more pressure or pushing while this happens.   Immediately after birth, your baby will be dried and kept warm. You may be able to hold and breastfeed your baby.   The umbilical cord may be clamped and cut during this time. This usually occurs after waiting a period of 1-2 minutes after delivery.   Your placenta will be removed from your uterus.   Your incisions will be closed with stitches (sutures). Staples, skin glue, or adhesive strips may also be applied to the incision in your abdomen.   Bandages (dressings) may be placed over the incision in your abdomen.  The procedure may vary among health care providers and hospitals.  What happens after the procedure?   Your blood pressure, heart rate, breathing rate, and blood oxygen level will be monitored until you are discharged from the hospital.   You may continue to receive fluids and medicines through an IV.   You will have some pain. Medicines will be available to help control your pain.   To help prevent blood clots:  ? You may be given medicines.  ? You may have to wear compression stockings or devices.  ? You will be encouraged to walk around when you are able.   Hospital staff will encourage and support bonding with your baby. Your hospital may have you and your baby to stay in the same room (rooming in) during your hospital stay to encourage successful bonding and breastfeeding.   You may be encouraged to cough and breathe deeply often. This helps to prevent lung problems.   If you have a catheter draining your urine, it will be removed as soon as possible after your procedure.  Summary   Cesarean birth, or cesarean delivery, is the surgical delivery of a baby through an incision in the abdomen and the uterus.   Follow instructions from your health care provider about eating or  drinking restrictions before the procedure.   You will have some pain after the procedure. Medicines will be available to help control your pain.   Hospital staff will encourage and support bonding with your baby after the procedure. Your hospital may have you and your baby to stay in the same room (rooming in) during your hospital stay to encourage successful bonding and breastfeeding.  This information is not intended to replace advice given to you by your health care provider. Make sure you discuss any questions you have with your health care provider.  Document Released: 05/21/2005 Document Revised: 11/25/2017 Document Reviewed: 11/25/2017  Elsevier Interactive Patient Education  2019 [employer] Inc[employer]

## 2018-10-09 NOTE — Progress Notes (Signed)
   PRENATAL VISIT NOTE  Subjective:  Ashley Costa is a 33 y.o. G2P1001 at [redacted]w[redacted]d being seen today for ongoing prenatal care.  She is currently monitored for the following issues for this low-risk pregnancy and has Tobacco abuse; Obesity, Class III, BMI 40-49.9 (morbid obesity) ([place]); Pregnancy with history of infertility; Obesity during pregnancy, antepartum; Status post primary low transverse cesarean section; Supervision of other normal pregnancy, antepartum; and History of primary cesarean section on their problem list.  Patient reports no complaints.  Contractions: Not present. Vag. Bleeding: None.  Movement: Present. Denies leaking of fluid.   The following portions of the patient's history were reviewed and updated as appropriate: allergies, current medications, past family history, past medical history, past social history, past surgical history and problem list.   Objective:   Vitals:   10/09/18 0848  BP: 115/77  Pulse: 82  Weight: 264 lb 9.6 oz (120 kg)    Fetal Status: Fetal Heart Rate (bpm): 141 Fundal Height: 35 cm Movement: Present     General:  Alert, oriented and cooperative. Patient is in no acute distress.  Skin: Skin is warm and dry. No rash noted.   Cardiovascular: Normal heart rate noted  Respiratory: Normal respiratory effort, no problems with respiration noted  Abdomen: Soft, gravid, appropriate for gestational age.  Pain/Pressure: Absent     Pelvic: Cervical exam deferred        Extremities: Normal range of motion.  Edema: None  Mental Status: Normal mood and affect. Normal behavior. Normal judgment and thought content.   Assessment and Plan:  Pregnancy: G2P1001 at [redacted]w[redacted]d 1. Supervision of other normal pregnancy, antepartum - Patient doing well, no complaints  - Anticipatory guidance on upcoming appointments  - Completed GTT today, will manage according to results  - Glucose Tolerance, 2 Hours w/1 Hour - CBC - HIV antibody (with reflex) - RPR  2.  History of primary cesarean section - Hx of C/S for fetal distress, patient reports occurring after epidural  - Educated and discussed option of repeat C/S vs TOLAC, discussed r/b of each patient decided on repeat C/S.  - Will schedule next appointment with MD to discuss procedure in detail and scheduled C/S, patient verbalizes understanding   3. Obesity during pregnancy, antepartum - TWG 4lb during this pregnancy  - Discussed recommended weight gain during pregnancy   Preterm labor symptoms and general obstetric precautions including but not limited to vaginal bleeding, contractions, leaking of fluid and fetal movement were reviewed in detail with the patient. Please refer to After Visit Summary for other counseling recommendations.   Return in about 2 weeks (around 10/23/2018) for [place]- webex (with MD).  Future Appointments  Date Time Provider [place]  10/14/2018  9:30 AM WH-MFC [place] 1 WH-MFCUS MFC-US  10/23/2018  9:45 AM Sloan Leiter, MD [place] None    Lajean Manes, CNM

## 2018-10-09 NOTE — Progress Notes (Signed)
ROB/GTT.  TDAP given in RD, tolerated well. ?

## 2018-10-10 LAB — CBC
Hematocrit: 37.4 % (ref 34.0–46.6)
Hemoglobin: 12.3 g/dL (ref 11.1–15.9)
MCH: 27.7 pg (ref 26.6–33.0)
MCHC: 32.9 g/dL (ref 31.5–35.7)
MCV: 84 fL (ref 79–97)
Platelets: 383 10*3/uL (ref 150–450)
RBC: 4.44 x10E6/uL (ref 3.77–5.28)
RDW: 14.8 % (ref 11.7–15.4)
WBC: 9.1 10*3/uL (ref 3.4–10.8)

## 2018-10-10 LAB — GLUCOSE TOLERANCE, 2 HOURS W/ 1HR
Glucose, 1 hour: 161 mg/dL (ref 65–179)
Glucose, 2 hour: 97 mg/dL (ref 65–152)
Glucose, Fasting: 92 mg/dL — ABNORMAL HIGH (ref 65–91)

## 2018-10-10 LAB — RPR: RPR Ser Ql: NONREACTIVE

## 2018-10-10 LAB — HIV ANTIBODY (ROUTINE TESTING W REFLEX): HIV Screen 4th Generation wRfx: NONREACTIVE

## 2018-10-13 ENCOUNTER — Encounter: Payer: Self-pay | Admitting: Certified Nurse Midwife

## 2018-10-13 DIAGNOSIS — O99810 Abnormal glucose complicating pregnancy: Secondary | ICD-10-CM | POA: Insufficient documentation

## 2018-10-14 ENCOUNTER — Ambulatory Visit (HOSPITAL_COMMUNITY): Payer: Self-pay

## 2018-10-14 ENCOUNTER — Other Ambulatory Visit: Payer: Self-pay

## 2018-10-14 ENCOUNTER — Telehealth: Payer: Self-pay | Admitting: *Deleted

## 2018-10-14 ENCOUNTER — Other Ambulatory Visit (HOSPITAL_COMMUNITY): Payer: Self-pay | Admitting: *Deleted

## 2018-10-14 ENCOUNTER — Ambulatory Visit (HOSPITAL_COMMUNITY)
Admission: RE | Admit: 2018-10-14 | Discharge: 2018-10-14 | Disposition: A | Discharge status: Home / Self Care | Payer: Medicaid Other | Source: Ambulatory Visit | Attending: Maternal & Fetal Medicine | Admitting: Maternal & Fetal Medicine

## 2018-10-14 DIAGNOSIS — Z362 Encounter for other antenatal screening follow-up: Secondary | ICD-10-CM

## 2018-10-14 DIAGNOSIS — Z3689 Encounter for other specified antenatal screening: Secondary | ICD-10-CM

## 2018-10-14 DIAGNOSIS — O34219 Maternal care for unspecified type scar from previous cesarean delivery: Secondary | ICD-10-CM

## 2018-10-14 DIAGNOSIS — O99213 Obesity complicating pregnancy, third trimester: Secondary | ICD-10-CM | POA: Diagnosis not present

## 2018-10-14 DIAGNOSIS — O99212 Obesity complicating pregnancy, second trimester: Secondary | ICD-10-CM | POA: Insufficient documentation

## 2018-10-14 DIAGNOSIS — Z3A28 28 weeks gestation of pregnancy: Secondary | ICD-10-CM

## 2018-10-14 DIAGNOSIS — O99333 Smoking (tobacco) complicating pregnancy, third trimester: Secondary | ICD-10-CM

## 2018-10-14 NOTE — Telephone Encounter (Signed)
Pt called to office for lab results.  Attempt to return call. No answer, LM on VM to call office.

## 2018-10-15 ENCOUNTER — Other Ambulatory Visit: Payer: Self-pay | Admitting: *Deleted

## 2018-10-15 DIAGNOSIS — Z3480 Encounter for supervision of other normal pregnancy, unspecified trimester: Secondary | ICD-10-CM

## 2018-10-15 DIAGNOSIS — O24419 Gestational diabetes mellitus in pregnancy, unspecified control: Secondary | ICD-10-CM

## 2018-10-15 MED ORDER — ACCU-CHEK FASTCLIX LANCETS MISC: 1.0000 | Freq: Four times a day (QID) | 5 refills | Status: DC

## 2018-10-15 MED ORDER — ACCU-CHEK GUIDE W/DEVICE KIT: 1.0000 | PACK | Freq: Once | 0 refills | Status: AC

## 2018-10-15 MED ORDER — GLUCOSE BLOOD VI STRP: ORAL_STRIP | 5 refills | Status: DC

## 2018-10-15 NOTE — Progress Notes (Signed)
Diabetic supplies and referral ordered today. Pt aware.

## 2018-10-23 ENCOUNTER — Encounter: Payer: Self-pay | Admitting: Obstetrics and Gynecology

## 2018-10-23 ENCOUNTER — Ambulatory Visit (INDEPENDENT_AMBULATORY_CARE_PROVIDER_SITE_OTHER): Payer: Medicaid Other | Admitting: Obstetrics and Gynecology

## 2018-10-23 ENCOUNTER — Other Ambulatory Visit: Payer: Self-pay

## 2018-10-23 DIAGNOSIS — O24415 Gestational diabetes mellitus in pregnancy, controlled by oral hypoglycemic drugs: Secondary | ICD-10-CM | POA: Insufficient documentation

## 2018-10-23 DIAGNOSIS — Z3480 Encounter for supervision of other normal pregnancy, unspecified trimester: Secondary | ICD-10-CM

## 2018-10-23 DIAGNOSIS — O24410 Gestational diabetes mellitus in pregnancy, diet controlled: Secondary | ICD-10-CM

## 2018-10-23 DIAGNOSIS — R87613 High grade squamous intraepithelial lesion on cytologic smear of cervix (HGSIL): Secondary | ICD-10-CM

## 2018-10-23 DIAGNOSIS — O99210 Obesity complicating pregnancy, unspecified trimester: Secondary | ICD-10-CM

## 2018-10-23 DIAGNOSIS — Z3A29 29 weeks gestation of pregnancy: Secondary | ICD-10-CM

## 2018-10-23 DIAGNOSIS — Z98891 History of uterine scar from previous surgery: Secondary | ICD-10-CM

## 2018-10-23 DIAGNOSIS — O99810 Abnormal glucose complicating pregnancy: Secondary | ICD-10-CM

## 2018-10-23 DIAGNOSIS — O99213 Obesity complicating pregnancy, third trimester: Secondary | ICD-10-CM

## 2018-10-23 NOTE — Progress Notes (Signed)
[place] VIRTUAL VIDEO VISIT ENCOUNTER NOTE  Provider location: Center for [facility] at [place]   I connected with Ashley Costa on 10/23/18 at  9:45 AM EDT by WebEx Video Encounter at home and verified that I am speaking with the correct person using two identifiers.   I discussed the limitations, risks, security and privacy concerns of performing an evaluation and management service by telephone and the availability of in person appointments. I also discussed with the patient that there may be a patient responsible charge related to this service. The patient expressed understanding and agreed to proceed. Subjective:  Ashley Costa is a 33 y.o. G2P1001 at [redacted]w[redacted]d being seen today for ongoing prenatal care.  She is currently monitored for the following issues for this high-risk pregnancy and has Tobacco abuse; Obesity, Class III, BMI 40-49.9 (morbid obesity) ([place]); Pregnancy with history of infertility; Obesity during pregnancy, antepartum; Status post primary low transverse cesarean section; Supervision of other normal pregnancy, antepartum; History of primary cesarean section; Abnormal glucose tolerance test (GTT) during pregnancy, antepartum; HSIL (high grade squamous intraepithelial lesion) on Pap smear of cervix; and Gestational diabetes on their problem list.  Patient reports occasional uterine cramping and pain with significant stretching.  Contractions: Not present. Vag. Bleeding: None.  Movement: Present. Denies any leaking of fluid.   The following portions of the patient's history were reviewed and updated as appropriate: allergies, current medications, past family history, past medical history, past social history, past surgical history and problem list.   Objective:  There were no vitals filed for this visit.  Fetal Status:     Movement: Present     General:  Alert, oriented and cooperative. Patient is in no acute distress.  Respiratory: Normal  respiratory effort, no problems with respiration noted  Mental Status: Normal mood and affect. Normal behavior. Normal judgment and thought content.  Rest of physical exam deferred due to type of encounter  Assessment and Plan:  Pregnancy: G2P1001 at [redacted]w[redacted]d  1. Supervision of other normal pregnancy, antepartum For nexplanon pp in hospital  2. History of primary cesarean section Reviewed risks/benefits of TOLAC versus RCS in detail. Patient counseled regarding potential vaginal delivery, chance of success, future implications, possible uterine rupture and need for urgent/emergent repeat cesarean. Counseled regarding potential need for repeat c-section for reasons unrelated to first c-section. Counseled regarding scheduled repeat cesarean including risks of bleeding, infection, damage to surrounding tissue, abnormal placentation, implications for future pregnancies. All questions answered.  Patient desires RCS. - message sent to scheduler to schedule RCS  3. Abnormal glucose tolerance test (GTT) during pregnancy, antepartum Not sure about diagnosis, recently started testing and has had all CBGs all within range FG: all have been under 95 PP: all have been under 120 Advised patient to continue checking CBGs, advised her of diagnosis and reasons to cont checking CBGs, she is agreeable - has f/u growth scheduled for 6/10  4. Obesity during pregnancy, antepartum  6. HSIL - colpo after delivery  Preterm labor symptoms and general obstetric precautions including but not limited to vaginal bleeding, contractions, leaking of fluid and fetal movement were reviewed in detail with the patient. I discussed the assessment and treatment plan with the patient. The patient was provided an opportunity to ask questions and all were answered. The patient agreed with the plan and demonstrated an understanding of the instructions. The patient was advised to call back or seek an in-person office evaluation/go to  MAU at [place] for  any urgent or concerning symptoms. Please refer to After Visit Summary for other counseling recommendations.   I provided 18 minutes of face-to-face time during this encounter.  Return in about 2 weeks (around 11/06/2018) for OB visit (MD), virtual.  Future Appointments  Date Time Provider [place]  11/05/2018  8:30 AM Anne Shutter, [place] NDM  11/12/2018  8:45 AM [place] NURSE [place] MFC-US  11/12/2018  8:45 AM [place], [place] for [place], [place]

## 2018-10-23 NOTE — Progress Notes (Signed)
Pt is on the phone preparing for virtual visit with provider. [redacted]w[redacted]d. Pt has been diagnosed with GDM, but "isn't agreeing with the diagnoses". She is checking her BG levels at home: Fasting (highest 92) after meals (highest 120). Pt has BP cuff but is not able to check her BP this morning.

## 2018-11-05 ENCOUNTER — Ambulatory Visit: Payer: Medicaid Other | Admitting: *Deleted

## 2018-11-06 ENCOUNTER — Encounter: Payer: Self-pay | Admitting: Obstetrics

## 2018-11-06 ENCOUNTER — Other Ambulatory Visit: Payer: Self-pay

## 2018-11-06 ENCOUNTER — Ambulatory Visit (INDEPENDENT_AMBULATORY_CARE_PROVIDER_SITE_OTHER): Payer: Medicaid Other | Admitting: Obstetrics

## 2018-11-06 DIAGNOSIS — O0993 Supervision of high risk pregnancy, unspecified, third trimester: Secondary | ICD-10-CM

## 2018-11-06 DIAGNOSIS — Z98891 History of uterine scar from previous surgery: Secondary | ICD-10-CM

## 2018-11-06 DIAGNOSIS — R87613 High grade squamous intraepithelial lesion on cytologic smear of cervix (HGSIL): Secondary | ICD-10-CM

## 2018-11-06 DIAGNOSIS — O24410 Gestational diabetes mellitus in pregnancy, diet controlled: Secondary | ICD-10-CM

## 2018-11-06 DIAGNOSIS — O99210 Obesity complicating pregnancy, unspecified trimester: Secondary | ICD-10-CM

## 2018-11-06 DIAGNOSIS — Z3A31 31 weeks gestation of pregnancy: Secondary | ICD-10-CM

## 2018-11-06 DIAGNOSIS — O99213 Obesity complicating pregnancy, third trimester: Secondary | ICD-10-CM

## 2018-11-06 DIAGNOSIS — O0990 Supervision of high risk pregnancy, unspecified, unspecified trimester: Secondary | ICD-10-CM

## 2018-11-06 NOTE — Progress Notes (Signed)
Pt is on the phone preparing for virtual visit with provider. Pt reports she has not been checking her blood glucose levels at home. Pt is attempting to check BP at home but cuff keeps reading "error". Pt denies any blurry vision or headaches.

## 2018-11-06 NOTE — Progress Notes (Addendum)
TELEHEALTH OBSTETRICS PRENATAL VIRTUAL VIDEO VISIT ENCOUNTER NOTE  Provider location: [facility] at [place]   I connected with Ashley Costa on 11/06/18 at 10:00 AM EDT by WebEx OB MyChart Video Encounter at home and verified that I am speaking with the correct person using two identifiers.   I discussed the limitations, risks, security and privacy concerns of performing an evaluation and management service by telephone and the availability of in person appointments. I also discussed with the patient that there may be a patient responsible charge related to this service. The patient expressed understanding and agreed to proceed. Subjective:  Ashley Costa is a 33 y.o. G2P1001 at [redacted]w[redacted]d being seen today for ongoing prenatal care.  She is currently monitored for the following issues for this high-risk pregnancy and has Tobacco abuse; Obesity, Class III, BMI 40-49.9 (morbid obesity) ([place]); Pregnancy with history of infertility; Obesity during pregnancy, antepartum; Status post primary low transverse cesarean section; Supervision of other normal pregnancy, antepartum; History of primary cesarean section; Abnormal glucose tolerance test (GTT) during pregnancy, antepartum; HSIL (high grade squamous intraepithelial lesion) on Pap smear of cervix; and Gestational diabetes on their problem list.  Patient reports no complaints.  Contractions: Not present. Vag. Bleeding: None.  Movement: Present. Denies any leaking of fluid.   The following portions of the patient's history were reviewed and updated as appropriate: allergies, current medications, past family history, past medical history, past social history, past surgical history and problem list.   Objective:  There were no vitals filed for this visit.  Fetal Status:     Movement: Present     General:  Alert, oriented and cooperative. Patient is in no acute distress.  Respiratory: Normal respiratory effort, no problems with  respiration noted  Mental Status: Normal mood and affect. Normal behavior. Normal judgment and thought content.  Rest of physical exam deferred due to type of encounter  Imaging: [place] Mfm Ob Follow Up  Result Date: 10/14/2018 ----------------------------------------------------------------------  OBSTETRICS REPORT                       (Signed Final 10/14/2018 11:03 am) ---------------------------------------------------------------------- Patient Info  ID #:       [id number]                          D.O.B.:  1985-08-02 (32 yrs)  Name:       Ashley Costa                Visit Date: 10/14/2018 10:43 am ---------------------------------------------------------------------- Performed By  Performed By:     Jeanene Erb BS,      Ref. Address:     [address], [place]  [place], [place]  Attending:        Tama High MD        [facility]  Referred By:      Shelly Bombard MD ---------------------------------------------------------------------- Orders   #  Description                          Code         Ordered By   1  [place] MFM OB FOLLOW UP                  [id number]     Sander Nephew  ----------------------------------------------------------------------   #  Order #                    Accession #                 Episode #   1  [id number]                  [id number]                  [id number]  ---------------------------------------------------------------------- Indications   [redacted] weeks gestation of pregnancy                Z0C.58   Obesity complicating pregnancy, second         O99.212   trimester (BMI 45)(AFP Negative, Low Risk    NIPS)   Encounter for other antenatal screening        Z36.2   follow-up   Tobacco use complicating pregnancy, third      O99.333   trimester   History of cesarean delivery, currently        O78.219   pregnant  ---------------------------------------------------------------------- Vital Signs  Weight (lb): 264                               Height:        5'4"  BMI:         45.31 ---------------------------------------------------------------------- Fetal Evaluation  Num Of Fetuses:         1  Fetal Heart Rate(bpm):  127  Cardiac Activity:  Observed  Presentation:           Cephalic  Placenta:               Anterior  P. Cord Insertion:      Previously Visualized  Amniotic Fluid  AFI FV:      Within normal limits                              Largest Pocket(cm)                              4 ---------------------------------------------------------------------- Biometry  BPD:      73.4  mm     G. Age:  29w 3d         75  %    CI:        76.95   %    70 - 86                                                          FL/HC:      20.2   %    18.8 - 20.6  HC:       265   mm     G. Age:  28w 6d         36  %    HC/AC:      1.07        1.05 - 1.21  AC:      247.5  mm     G. Age:  29w 0d         64  %    FL/BPD:     72.8   %    71 - 87  FL:       53.4  mm     G. Age:  28w 3d         37  %    FL/AC:      21.6   %    20 - 24  Est. FW:    1284  gm    2 lb 13 oz      61  % ---------------------------------------------------------------------- OB History  Gravidity:    2         Term:   1        Prem:   0        SAB:   0  TOP:          0       Ectopic:  0        Living: 1 ---------------------------------------------------------------------- Gestational Age  LMP:           28w 2d        Date:  03/30/18                 EDD:   01/04/19  U/S Today:     29w 0d                                        EDD:   12/30/18  Best:  28w 2d     Det. By:  LMP  (03/30/18)          EDD:   01/04/19  ---------------------------------------------------------------------- Anatomy  Cranium:               Appears normal         Aortic Arch:            Appears normal  Cavum:                 Appears normal         Ductal Arch:            Appears normal  Ventricles:            Appears normal         Diaphragm:              Previously seen  Choroid Plexus:        Appears normal         Stomach:                Appears normal, left                                                                        sided  Cerebellum:            Appears normal         Abdomen:                Appears normal  Posterior Fossa:       Not well visualized    Abdominal Wall:         Previously seen  Nuchal Fold:           Not applicable (>47    Cord Vessels:           Appears normal ([redacted]                         wks GA)                                        vessel cord)  Face:                  Appears normal         Kidneys:                Appear normal                         (orbits and profile)  Lips:                  Appears normal         Bladder:                Appears normal  Thoracic:              Appears normal         Spine:                  Limited views  appear normal  Heart:                 Appears normal         Upper Extremities:      Previously seen                         (4CH, axis, and                         situs)  RVOT:                  Not well visualized    Lower Extremities:      Previously seen  LVOT:                  Not well visualized  Other:  Female gender. Technically difficult due to maternal habitus and fetal          position. ---------------------------------------------------------------------- Cervix Uterus Adnexa  Cervix  Not visualized (advanced GA >24wks) ---------------------------------------------------------------------- Impression  Patient returned for completion of fetal anatomy. Amniotic  fluid is normal and good fetal activity is  seen. Fetal growth is  appropriate for gestational age.  Fetal anatomical survey still could not be completed because  of fetal position and maternal obesity.  Patient has a recent diagnosis of gestational diabetes. ---------------------------------------------------------------------- Recommendations  An appointment was made for her to return in 4 weeks for  fetal growth assessment. ----------------------------------------------------------------------                  Tama High, MD Electronically Signed Final Report   10/14/2018 11:03 am ----------------------------------------------------------------------   Assessment and Plan:  Pregnancy: G2P1001 at [redacted]w[redacted]d 1. Supervision of high risk pregnancy, antepartum  2. History of primary cesarean section - plans repeat C/S  3. Diet controlled gestational diabetes mellitus (GDM) in third trimester - very good glucose control, with FBS's < 90 and PP's < 120 mg/dl  4. Obesity during pregnancy, antepartum   5. HSIL (high grade squamous intraepithelial lesion) on Pap smear of cervix - colposcopy 3-4 months postpartum   Preterm labor symptoms and general obstetric precautions including but not limited to vaginal bleeding, contractions, leaking of fluid and fetal movement were reviewed in detail with the patient. I discussed the assessment and treatment plan with the patient. The patient was provided an opportunity to ask questions and all were answered. The patient agreed with the plan and demonstrated an understanding of the instructions. The patient was advised to call back or seek an in-person office evaluation/go to [facility]U at [facility] for any urgent or concerning symptoms. Please refer to After Visit Summary for other counseling recommendations.   I provided 10 minutes of face-to-face time during this encounter.  Return in about 2 weeks (around 11/20/2018) for [facility].  Future Appointments  Date Time Provider [place]    11/12/2018  8:45 AM [place] MFC-US  11/12/2018  8:45 AM [place] 5 WH-MFCUS MFC-US  11/20/2018 10:00 AM Shelly Bombard, MD [place] None    Baltazar Najjar, [place] for [facility], [place] [facility] 11-06-2018

## 2018-11-12 ENCOUNTER — Other Ambulatory Visit (HOSPITAL_COMMUNITY): Payer: Self-pay | Admitting: *Deleted

## 2018-11-12 ENCOUNTER — Ambulatory Visit (HOSPITAL_COMMUNITY): Payer: Medicaid Other | Admitting: *Deleted

## 2018-11-12 ENCOUNTER — Other Ambulatory Visit: Payer: Self-pay

## 2018-11-12 ENCOUNTER — Encounter (HOSPITAL_COMMUNITY): Payer: Self-pay

## 2018-11-12 ENCOUNTER — Ambulatory Visit (HOSPITAL_COMMUNITY)
Admission: RE | Admit: 2018-11-12 | Discharge: 2018-11-12 | Disposition: A | Discharge status: Home / Self Care | Payer: Medicaid Other | Source: Ambulatory Visit | Attending: Obstetrics and Gynecology | Admitting: Obstetrics and Gynecology

## 2018-11-12 VITALS — BP 129/74 | HR 74 | Temp 98.6°F

## 2018-11-12 DIAGNOSIS — Z362 Encounter for other antenatal screening follow-up: Secondary | ICD-10-CM

## 2018-11-12 DIAGNOSIS — O99213 Obesity complicating pregnancy, third trimester: Secondary | ICD-10-CM

## 2018-11-12 DIAGNOSIS — O24410 Gestational diabetes mellitus in pregnancy, diet controlled: Secondary | ICD-10-CM | POA: Insufficient documentation

## 2018-11-12 DIAGNOSIS — Z3689 Encounter for other specified antenatal screening: Secondary | ICD-10-CM | POA: Insufficient documentation

## 2018-11-12 DIAGNOSIS — Z3A32 32 weeks gestation of pregnancy: Secondary | ICD-10-CM

## 2018-11-12 DIAGNOSIS — O99333 Smoking (tobacco) complicating pregnancy, third trimester: Secondary | ICD-10-CM | POA: Diagnosis not present

## 2018-11-12 DIAGNOSIS — O34219 Maternal care for unspecified type scar from previous cesarean delivery: Secondary | ICD-10-CM

## 2018-11-20 ENCOUNTER — Encounter: Payer: Self-pay | Admitting: Obstetrics

## 2018-11-20 ENCOUNTER — Other Ambulatory Visit: Payer: Self-pay

## 2018-11-20 ENCOUNTER — Ambulatory Visit (INDEPENDENT_AMBULATORY_CARE_PROVIDER_SITE_OTHER): Payer: Medicaid Other | Admitting: Obstetrics

## 2018-11-20 DIAGNOSIS — Z98891 History of uterine scar from previous surgery: Secondary | ICD-10-CM

## 2018-11-20 DIAGNOSIS — O0993 Supervision of high risk pregnancy, unspecified, third trimester: Secondary | ICD-10-CM

## 2018-11-20 DIAGNOSIS — O0990 Supervision of high risk pregnancy, unspecified, unspecified trimester: Secondary | ICD-10-CM

## 2018-11-20 DIAGNOSIS — O99213 Obesity complicating pregnancy, third trimester: Secondary | ICD-10-CM

## 2018-11-20 DIAGNOSIS — O24410 Gestational diabetes mellitus in pregnancy, diet controlled: Secondary | ICD-10-CM

## 2018-11-20 DIAGNOSIS — Z3A33 33 weeks gestation of pregnancy: Secondary | ICD-10-CM

## 2018-11-20 DIAGNOSIS — R87613 High grade squamous intraepithelial lesion on cytologic smear of cervix (HGSIL): Secondary | ICD-10-CM

## 2018-11-20 DIAGNOSIS — O99210 Obesity complicating pregnancy, unspecified trimester: Secondary | ICD-10-CM

## 2018-11-20 NOTE — Progress Notes (Signed)
TELEHEALTH OBSTETRICS PRENATAL VIRTUAL VIDEO VISIT ENCOUNTER NOTE  Provider location: [facility] at [place]   I connected with Ashley Costa on 11/20/18 at 10:00 AM EDT by WebEx OB MyChart Video Encounter at home and verified that I am speaking with the correct person using two identifiers.   I discussed the limitations, risks, security and privacy concerns of performing an evaluation and management service by telephone and the availability of in person appointments. I also discussed with the patient that there may be a patient responsible charge related to this service. The patient expressed understanding and agreed to proceed. Subjective:  Ashley Costa is a 33 y.o. G2P1001 at [redacted]w[redacted]d being seen today for ongoing prenatal care.  She is currently monitored for the following issues for this high-risk pregnancy and has Tobacco abuse; Obesity, Class III, BMI 40-49.9 (morbid obesity) ([place]); Pregnancy with history of infertility; Obesity during pregnancy, antepartum; Status post primary low transverse cesarean section; Supervision of other normal pregnancy, antepartum; History of primary cesarean section; Abnormal glucose tolerance test (GTT) during pregnancy, antepartum; HSIL (high grade squamous intraepithelial lesion) on Pap smear of cervix; and Gestational diabetes on their problem list.  Patient reports backache.  Contractions: Not present. Vag. Bleeding: None.  Movement: Present. Denies any leaking of fluid.   The following portions of the patient's history were reviewed and updated as appropriate: allergies, current medications, past family history, past medical history, past social history, past surgical history and problem list.   Objective:  There were no vitals filed for this visit.  Fetal Status:     Movement: Present     General:  Alert, oriented and cooperative. Patient is in no acute distress.  Respiratory: Normal respiratory effort, no problems with  respiration noted  Mental Status: Normal mood and affect. Normal behavior. Normal judgment and thought content.  Rest of physical exam deferred due to type of encounter  Imaging: [place] Mfm Ob Follow Up  Result Date: 11/12/2018 ----------------------------------------------------------------------  OBSTETRICS REPORT                        (Signed Final 11/12/2018 10:43 am) ---------------------------------------------------------------------- Patient Info  ID #:       [id number]                          D.O.B.:  Nov 14, 1985 (32 yrs)  Name:       Ashley Costa                Visit Date: 11/12/2018 09:09 am ---------------------------------------------------------------------- Performed By  Performed By:     Jeanene Erb BS,      Ref. Address:      [place], [place]  [place], [place]  Attending:        Tama High MD        [facility]  Referred By:      Shelly Bombard MD ---------------------------------------------------------------------- Orders   #  Description                          Code         Ordered By   1  [place] MFM OB FOLLOW UP                  [phone]     Tama High  ----------------------------------------------------------------------   #  Order #                    Accession #                 Episode #   1  [id number]                  [id number]                  [id number]  ---------------------------------------------------------------------- Indications   [redacted] weeks gestation of pregnancy                Z3A.32   Gestational diabetes in pregnancy, diet        O24.410   controlled   Encounter for other antenatal screening        Z36.2   follow-up   Obesity  complicating pregnancy, third          O99.213   trimester (BMI 45)(Low Risk NIPS)(Negative   AFP)   Tobacco use complicating pregnancy, third      O99.333   trimester   History of cesarean delivery, currently        O64.219   pregnant  ---------------------------------------------------------------------- Vital Signs  Weight (lb): 264                               Height:        5'4"  BMI:         45.31 ---------------------------------------------------------------------- Fetal Evaluation  Num Of Fetuses:          1  Fetal Heart Rate(bpm):   126  Cardiac Activity:        Observed  Presentation:            Cephalic  Placenta:  Anterior  P. Cord Insertion:       Previously Visualized  Amniotic Fluid  AFI FV:      Within normal limits  AFI Sum(cm)     %Tile       Largest Pocket(cm)  13.67           45          4.31  RUQ(cm)       RLQ(cm)       LUQ(cm)        LLQ(cm)  3.24          4.07          2.05           4.31 ---------------------------------------------------------------------- Biometry  BPD:        82  mm     G. Age:  33w 0d         2  %    CI:        77.29   %    70 - 86                                                          FL/HC:       20.6  %    19.1 - 21.3  HC:      295.3  mm     G. Age:  32w 4d         20  %    HC/AC:       1.03       0.96 - 1.17  AC:      287.5  mm     G. Age:  32w 5d         59  %    FL/BPD:      74.0  %    71 - 87  FL:       60.7  mm     G. Age:  31w 4d         18  %    FL/AC:       21.1  %    20 - 24  Est. FW:    1968   gm     4 lb 5 oz     57  % ---------------------------------------------------------------------- OB History  Gravidity:    2         Term:   1        Prem:   0        SAB:   0  TOP:          0       Ectopic:  0        Living: 1 ---------------------------------------------------------------------- Gestational Age  LMP:           32w 3d        Date:  03/30/18                 EDD:   01/04/19  U/S Today:     32w 3d                                         EDD:   01/04/19  Best:  32w 3d     Det. By:  LMP  (03/30/18)          EDD:   01/04/19 ---------------------------------------------------------------------- Anatomy  Cranium:               Appears normal         Aortic Arch:            Previously seen  Cavum:                 Previously seen        Ductal Arch:            Previously seen  Ventricles:            Appears normal         Diaphragm:              Previously seen  Choroid Plexus:        Previously seen        Stomach:                Appears normal, left                                                                        sided  Cerebellum:            Previously seen        Abdomen:                Appears normal  Posterior Fossa:       Not well visualized    Abdominal Wall:         Previously seen  Nuchal Fold:           Not applicable (>84    Cord Vessels:           Previously seen                         wks GA)  Face:                  Orbits and profile     Kidneys:                Previously seen                         previously seen  Lips:                  Previously seen        Bladder:                Appears normal  Thoracic:              Appears normal         Spine:                  Limited views prev  seen  Heart:                 Previously seen        Upper Extremities:      Previously seen  RVOT:                  Not well visualized    Lower Extremities:      Previously seen  LVOT:                  Not well visualized  Other:  Female gender. Technically difficult due to maternal habitus and fetal          position. ---------------------------------------------------------------------- Cervix Uterus Adnexa  Cervix  Not visualized (advanced GA >24wks) ---------------------------------------------------------------------- Impression  Gestational diabetes. Well-controlled on diet.  Amniotic fluid is normal and good fetal activity is seen. Fetal  growth is appropriate for  gestational age. ---------------------------------------------------------------------- Recommendations  An appointment was made for her to return in 4 weeks for  fetal growth assessment. ----------------------------------------------------------------------                  Tama High, MD Electronically Signed Final Report   11/12/2018 10:43 am ----------------------------------------------------------------------   Assessment and Plan:  Pregnancy: G2P1001 at [redacted]w[redacted]d 1. Supervision of high risk pregnancy, antepartum  2. History of primary cesarean section  3. Diet controlled gestational diabetes mellitus (GDM) in third trimester  4. Obesity during pregnancy, antepartum   5. HSIL (high grade squamous intraepithelial lesion) on Pap smear of cervix    Preterm labor symptoms and general obstetric precautions including but not limited to vaginal bleeding, contractions, leaking of fluid and fetal movement were reviewed in detail with the patient. I discussed the assessment and treatment plan with the patient. The patient was provided an opportunity to ask questions and all were answered. The patient agreed with the plan and demonstrated an understanding of the instructions. The patient was advised to call back or seek an in-person office evaluation/go to MAU at [facility] for any urgent or concerning symptoms. Please refer to After Visit Summary for other counseling recommendations.   I provided 10 minutes of face-to-face time during this encounter.    Future Appointments  Date Time Provider [place]  12/10/2018  9:15 AM [place] NURSE [place] MFC-US  12/10/2018  9:15 AM [place] 4 WH-MFCUS MFC-US    Baltazar Najjar, [place] for [facility], [place] [facility] 11-20-2018

## 2018-11-20 NOTE — Progress Notes (Signed)
WebEx OB.  C/o pressure.

## 2018-12-04 ENCOUNTER — Encounter: Payer: Self-pay | Admitting: Obstetrics and Gynecology

## 2018-12-04 ENCOUNTER — Ambulatory Visit (INDEPENDENT_AMBULATORY_CARE_PROVIDER_SITE_OTHER): Payer: Medicaid Other | Admitting: Obstetrics and Gynecology

## 2018-12-04 DIAGNOSIS — R87613 High grade squamous intraepithelial lesion on cytologic smear of cervix (HGSIL): Secondary | ICD-10-CM

## 2018-12-04 DIAGNOSIS — O99810 Abnormal glucose complicating pregnancy: Secondary | ICD-10-CM

## 2018-12-04 DIAGNOSIS — O24410 Gestational diabetes mellitus in pregnancy, diet controlled: Secondary | ICD-10-CM

## 2018-12-04 DIAGNOSIS — Z3A35 35 weeks gestation of pregnancy: Secondary | ICD-10-CM

## 2018-12-04 DIAGNOSIS — Z3480 Encounter for supervision of other normal pregnancy, unspecified trimester: Secondary | ICD-10-CM

## 2018-12-04 DIAGNOSIS — Z98891 History of uterine scar from previous surgery: Secondary | ICD-10-CM

## 2018-12-04 MED ORDER — METFORMIN HCL 500 MG PO TABS: 500.0000 mg | ORAL_TABLET | Freq: Every day | ORAL | 5 refills | Status: DC

## 2018-12-04 NOTE — Progress Notes (Addendum)
WebEx ROB.  States that her BP is not working properly, hence she is unable to check her BP. Wants to talk about carbonated drinks.

## 2018-12-04 NOTE — Progress Notes (Signed)
   [place] VIRTUAL VIDEO VISIT ENCOUNTER NOTE  Provider location: [facility] at [place]   I connected with [name] on 12/04/18 at 11:15 AM EDT by WebEx Video Encounter at home and verified that I am speaking with the correct person using two identifiers.   I discussed the limitations, risks, security and privacy concerns of performing an evaluation and management service by telephone and the availability of in person appointments. I also discussed with the patient that there may be a patient responsible charge related to this service. The patient expressed understanding and agreed to proceed. Subjective:  Ashley Costa is a 33 y.o. G2P1001 at [redacted]w[redacted]d being seen today for ongoing prenatal care.  She is currently monitored for the following issues for this high-risk pregnancy and has Tobacco abuse; Obesity, Class III, BMI 40-49.9 (morbid obesity) ([place]); Pregnancy with history of infertility; Obesity during pregnancy, antepartum; Status post primary low transverse cesarean section; Supervision of other normal pregnancy, antepartum; History of primary cesarean section; Abnormal glucose tolerance test (GTT) during pregnancy, antepartum; HSIL (high grade squamous intraepithelial lesion) on Pap smear of cervix; and Gestational diabetes on their problem list.  Patient reports feeling nauseous with carbonated drinks.  Contractions: Not present. Vag. Bleeding: None.  Movement: Present. Denies any leaking of fluid.   The following portions of the patient's history were reviewed and updated as appropriate: allergies, current medications, past family history, past medical history, past social history, past surgical history and problem list.   Objective:  There were no vitals filed for this visit.  Fetal Status:     Movement: Present     General:  Alert, oriented and cooperative. Patient is in no acute distress.  Respiratory: Normal respiratory effort, no  problems with respiration noted  Mental Status: Normal mood and affect. Normal behavior. Normal judgment and thought content.  Rest of physical exam deferred due to type of encounter   Assessment and Plan:  Pregnancy: G2P1001 at [redacted]w[redacted]d  1. Supervision of other normal pregnancy, antepartum  2. History of primary cesarean section Has repeat c-section scheduled for 12/28/18  3. HSIL (high grade squamous intraepithelial lesion) on Pap smear of cervix colpo pp  4. Diet controlled gestational diabetes mellitus (GDM) in third trimester Diet controlled FG: 94-100, reports mostly are 96-99 PP: 110-120 Will start metformin 500 mg QHS Needs to start BPP weekly and f/u US   Preterm labor symptoms and general obstetric precautions including but not limited to vaginal bleeding, contractions, leaking of fluid and fetal movement were reviewed in detail with the patient. I discussed the assessment and treatment plan with the patient. The patient was provided an opportunity to ask questions and all were answered. The patient agreed with the plan and demonstrated an understanding of the instructions. The patient was advised to call back or seek an in-person office evaluation/go to MAU at [facility] for any urgent or concerning symptoms. Please refer to After Visit Summary for other counseling recommendations.   I provided 15 minutes of face-to-face time during this encounter.  Return in about 1 week (around 12/11/2018) for vaginal swabs, in person, OB visit (MD).  Future Appointments  Date Time Provider [place]  12/10/2018  9:15 AM [place] NURSE [place] MFC-US  12/10/2018  9:15 AM [place] 4 WH-MFCUS MFC-US    Sloan Leiter, [place] for [facility], [place]

## 2018-12-10 ENCOUNTER — Encounter (HOSPITAL_COMMUNITY): Payer: Self-pay | Admitting: *Deleted

## 2018-12-10 ENCOUNTER — Ambulatory Visit (HOSPITAL_COMMUNITY)
Admission: RE | Admit: 2018-12-10 | Discharge: 2018-12-10 | Disposition: A | Discharge status: Home / Self Care | Payer: Medicaid Other | Source: Ambulatory Visit | Attending: Obstetrics and Gynecology | Admitting: Obstetrics and Gynecology

## 2018-12-10 ENCOUNTER — Ambulatory Visit (HOSPITAL_COMMUNITY): Payer: Medicaid Other | Admitting: *Deleted

## 2018-12-10 ENCOUNTER — Other Ambulatory Visit: Payer: Self-pay

## 2018-12-10 DIAGNOSIS — O24410 Gestational diabetes mellitus in pregnancy, diet controlled: Secondary | ICD-10-CM | POA: Insufficient documentation

## 2018-12-10 DIAGNOSIS — O99333 Smoking (tobacco) complicating pregnancy, third trimester: Secondary | ICD-10-CM

## 2018-12-10 DIAGNOSIS — Z98891 History of uterine scar from previous surgery: Secondary | ICD-10-CM

## 2018-12-10 DIAGNOSIS — Z362 Encounter for other antenatal screening follow-up: Secondary | ICD-10-CM

## 2018-12-10 DIAGNOSIS — Z3480 Encounter for supervision of other normal pregnancy, unspecified trimester: Secondary | ICD-10-CM | POA: Diagnosis present

## 2018-12-10 DIAGNOSIS — O99810 Abnormal glucose complicating pregnancy: Secondary | ICD-10-CM | POA: Insufficient documentation

## 2018-12-10 DIAGNOSIS — O99210 Obesity complicating pregnancy, unspecified trimester: Secondary | ICD-10-CM | POA: Insufficient documentation

## 2018-12-10 DIAGNOSIS — Z3A36 36 weeks gestation of pregnancy: Secondary | ICD-10-CM

## 2018-12-10 DIAGNOSIS — O99213 Obesity complicating pregnancy, third trimester: Secondary | ICD-10-CM | POA: Diagnosis not present

## 2018-12-10 DIAGNOSIS — O34219 Maternal care for unspecified type scar from previous cesarean delivery: Secondary | ICD-10-CM | POA: Diagnosis not present

## 2018-12-11 ENCOUNTER — Ambulatory Visit (INDEPENDENT_AMBULATORY_CARE_PROVIDER_SITE_OTHER): Payer: Medicaid Other | Admitting: Family Medicine

## 2018-12-11 ENCOUNTER — Encounter: Payer: Self-pay | Admitting: Family Medicine

## 2018-12-11 ENCOUNTER — Other Ambulatory Visit (HOSPITAL_COMMUNITY)
Admission: RE | Admit: 2018-12-11 | Discharge: 2018-12-11 | Disposition: A | Discharge status: Home / Self Care | Payer: Medicaid Other | Source: Ambulatory Visit | Attending: Family Medicine | Admitting: Family Medicine

## 2018-12-11 VITALS — BP 120/78 | HR 89 | Temp 97.7°F | Wt 266.9 lb

## 2018-12-11 DIAGNOSIS — Z3480 Encounter for supervision of other normal pregnancy, unspecified trimester: Secondary | ICD-10-CM | POA: Diagnosis not present

## 2018-12-11 DIAGNOSIS — Z3A36 36 weeks gestation of pregnancy: Secondary | ICD-10-CM

## 2018-12-11 DIAGNOSIS — O24415 Gestational diabetes mellitus in pregnancy, controlled by oral hypoglycemic drugs: Secondary | ICD-10-CM

## 2018-12-11 NOTE — Patient Instructions (Signed)

## 2018-12-11 NOTE — Progress Notes (Signed)
    PRENATAL VISIT NOTE  Subjective:  Ashley Costa is a 33 y.o. G2P1001 at [redacted]w[redacted]d being seen today for ongoing prenatal care.  She is currently monitored for the following issues for this high-risk pregnancy and has Tobacco abuse; Obesity, Class III, BMI 40-49.9 (morbid obesity) ([place]); Pregnancy with history of infertility; Obesity during pregnancy, antepartum; Status post primary low transverse cesarean section; Supervision of other normal pregnancy, antepartum; History of primary cesarean section; HSIL (high grade squamous intraepithelial lesion) on Pap smear of cervix; and Gestational diabetes on their problem list.  Patient reports no complaints.  Contractions: Irritability. Vag. Bleeding: None.  Movement: Present. Denies leaking of fluid.   The following portions of the patient's history were reviewed and updated as appropriate: allergies, current medications, past family history, past medical history, past social history, past surgical history and problem list.   Objective:   Vitals:   12/11/18 1536 12/11/18 1537  BP: (!) 160/83 120/78  Pulse: 89   Temp: 97.7 F (36.5 C)   Weight: 266 lb 14.4 oz (121.1 kg)     Fetal Status: Fetal Heart Rate (bpm): 152   Movement: Present     General:  Alert, oriented and cooperative. Patient is in no acute distress.  Skin: Skin is warm and dry. No rash noted.   Cardiovascular: Normal heart rate noted  Respiratory: Normal respiratory effort, no problems with respiration noted  Abdomen: Soft, gravid, appropriate for gestational age.  Pain/Pressure: Absent     Pelvic: Cervical exam deferred        Extremities: Normal range of motion.  Edema: None  Mental Status: Normal mood and affect. Normal behavior. Normal judgment and thought content.   Assessment and Plan:  Pregnancy: G2P1001 at [redacted]w[redacted]d 1. Gestational diabetes mellitus (GDM) in third trimester controlled on oral hypoglycemic drug On metformin.  No CBGs today--reports that they are mostly  in range. FBS are in the low to high 90s. Last growth at 27%. - [place] MFM FETAL BPP WO NON STRESS; Future  2. Supervision of other normal pregnancy, antepartum Cultures today for RCS--scheduled at 39 wks. Repeat BP ok-- - Strep Gp B NAA - Cervicovaginal ancillary only( [place])  Preterm labor symptoms and general obstetric precautions including but not limited to vaginal bleeding, contractions, leaking of fluid and fetal movement were reviewed in detail with the patient. Please refer to After Visit Summary for other counseling recommendations.   Return in 1 week (on 12/18/2018) for virtual.  Future Appointments  Date Time Provider [place]  12/19/2018 10:15 AM Constant, Vickii Chafe, MD [facility] None    Donnamae Jude, MD

## 2018-12-11 NOTE — Progress Notes (Signed)
Pt presents for GBS/GC/CT.  Did not bring CBG log today.

## 2018-12-12 LAB — CERVICOVAGINAL ANCILLARY ONLY
Chlamydia: NEGATIVE
Neisseria Gonorrhea: NEGATIVE

## 2018-12-13 LAB — STREP GP B NAA: Strep Gp B NAA: POSITIVE — AB

## 2018-12-15 ENCOUNTER — Encounter: Payer: Self-pay | Admitting: Family Medicine

## 2018-12-15 DIAGNOSIS — O99820 Streptococcus B carrier state complicating pregnancy: Secondary | ICD-10-CM | POA: Insufficient documentation

## 2018-12-17 ENCOUNTER — Encounter (HOSPITAL_COMMUNITY): Payer: Self-pay

## 2018-12-17 NOTE — Patient Instructions (Signed)
Amel Kitch  12/17/2018   Your procedure is scheduled on:  12/28/2018  Arrive at 0700 at Entrance C on [address] at [facility]  and [employer]. You are invited to use the FREE valet parking or use the Visitor's parking deck.  Pick up the phone at the desk and dial [phone].  Call this number if you have problems the morning of surgery: [phone]  Remember:   Do not eat food:(After Midnight) Desps de medianoche.  Do not drink clear liquids: (After Midnight) Desps de medianoche.  Take these medicines the morning of surgery with A SIP OF WATER:  Do not take metformin the night before or the morning of surgery   Do not wear jewelry, make-up or nail polish.  Do not wear lotions, powders, or perfumes. Do not wear deodorant.  Do not shave 48 hours prior to surgery.  Do not bring valuables to the hospital.  [facility] is not   responsible for any belongings or valuables brought to the hospital.  Contacts, dentures or bridgework may not be worn into surgery.  Leave suitcase in the car. After surgery it may be brought to your room.  For patients admitted to the hospital, checkout time is 11:00 AM the day of              discharge.      Please read over the following fact sheets that you were given:     Preparing for Surgery

## 2018-12-19 ENCOUNTER — Encounter: Payer: Self-pay | Admitting: Obstetrics and Gynecology

## 2018-12-19 ENCOUNTER — Ambulatory Visit (INDEPENDENT_AMBULATORY_CARE_PROVIDER_SITE_OTHER): Payer: Medicaid Other | Admitting: Obstetrics and Gynecology

## 2018-12-19 DIAGNOSIS — Z3480 Encounter for supervision of other normal pregnancy, unspecified trimester: Secondary | ICD-10-CM

## 2018-12-19 DIAGNOSIS — O99210 Obesity complicating pregnancy, unspecified trimester: Secondary | ICD-10-CM

## 2018-12-19 DIAGNOSIS — O99820 Streptococcus B carrier state complicating pregnancy: Secondary | ICD-10-CM

## 2018-12-19 DIAGNOSIS — Z3A37 37 weeks gestation of pregnancy: Secondary | ICD-10-CM

## 2018-12-19 DIAGNOSIS — O99213 Obesity complicating pregnancy, third trimester: Secondary | ICD-10-CM

## 2018-12-19 DIAGNOSIS — O24415 Gestational diabetes mellitus in pregnancy, controlled by oral hypoglycemic drugs: Secondary | ICD-10-CM

## 2018-12-19 NOTE — Progress Notes (Signed)
S/w pt to prepare for webvisit. Pt states that her BP cuff at home is old and does not work well. Pt states that fasting BG was 93 today, reports fetal movement and irregular contractions.

## 2018-12-19 NOTE — Progress Notes (Signed)
[place] VIRTUAL VIDEO VISIT ENCOUNTER NOTE  Provider location: [facility] at [place]   I connected with [name] on 12/19/18 at 10:15 AM EDT by WebEx Video Encounter at home and verified that I am speaking with the correct person using two identifiers.   I discussed the limitations, risks, security and privacy concerns of performing an evaluation and management service virtually and the availability of in person appointments. I also discussed with the patient that there may be a patient responsible charge related to this service. The patient expressed understanding and agreed to proceed. Subjective:  Nolie Bignell is a 33 y.o. G2P1001 at [redacted]w[redacted]d being seen today for ongoing prenatal care.  She is currently monitored for the following issues for this high-risk pregnancy and has Tobacco abuse; Obesity, Class III, BMI 40-49.9 (morbid obesity) ([place]); Pregnancy with history of infertility; Obesity during pregnancy, antepartum; Status post primary low transverse cesarean section; Supervision of other normal pregnancy, antepartum; History of primary cesarean section; HSIL (high grade squamous intraepithelial lesion) on Pap smear of cervix; Gestational diabetes; and Group B Streptococcus carrier, +RV culture, currently pregnant on their problem list.  Patient reports no complaints.  Contractions: Irregular. Vag. Bleeding: None.  Movement: Present. Denies any leaking of fluid.   The following portions of the patient's history were reviewed and updated as appropriate: allergies, current medications, past family history, past medical history, past social history, past surgical history and problem list.   Objective:  There were no vitals filed for this visit.  Fetal Status:     Movement: Present     General:  Alert, oriented and cooperative. Patient is in no acute distress.  Respiratory: Normal respiratory effort, no problems with respiration noted    Mental Status: Normal mood and affect. Normal behavior. Normal judgment and thought content.  Rest of physical exam deferred due to type of encounter  Imaging: [place] Mfm Fetal Bpp Wo Non Stress  Result Date: 12/10/2018 ----------------------------------------------------------------------  OBSTETRICS REPORT                       (Signed Final 12/10/2018 11:06 am) ---------------------------------------------------------------------- Patient Info  ID #:       [id number]                          D.O.B.:  1985/09/19 (33 yrs)  Name:       [name]                Visit Date: 12/10/2018 10:13 am ---------------------------------------------------------------------- Performed By  Performed By:     Felecia Jan        Ref. Address:     [place], [place]  [place], [place]  Attending:        Sander Nephew      [facility]                    MD                                       [facility]  Referred By:      Shelly Bombard MD ---------------------------------------------------------------------- Orders   #  Description                          Code         Ordered By   1  [place] MFM OB FOLLOW UP                  76816.01     RAVI SHANKAR   2  [place] MFM FETAL BPP WO NON              76819.01     [place]  ----------------------------------------------------------------------   #  Order #                    Accession #                 Episode #   1  [id number]                  [id number]                  [id number]   2  [id number]                  [id number]                  [id number]  ---------------------------------------------------------------------- Indications   Gestational diabetes in pregnancy, diet        O24.410   controlled   [redacted]  weeks gestation of pregnancy                Z3A.36   Encounter for other antenatal screening        Z36.2   follow-up   Obesity complicating pregnancy, third          O99.213   trimester (BMI 45)(Low Risk NIPS)(Negative   AFP)   Tobacco use complicating pregnancy, third      O99.333   trimester   History of cesarean delivery, currently        O34.219   pregnant  ---------------------------------------------------------------------- Vital Signs                                                 Height:  5'4" ---------------------------------------------------------------------- Fetal Evaluation  Num Of Fetuses:         1  Fetal Heart Rate(bpm):  151  Cardiac Activity:       Observed  Presentation:           Cephalic  Placenta:               Anterior  Amniotic Fluid  AFI FV:      Within normal limits  AFI Sum(cm)     %Tile       Largest Pocket(cm)  10.63           27          3.8  RUQ(cm)                     LUQ(cm)        LLQ(cm)  3.54                        3.8            3.29 ---------------------------------------------------------------------- Biophysical Evaluation  Amniotic F.V:   Within normal limits       F. Tone:        Observed  F. Movement:    Observed                   Score:          8/8  F. Breathing:   Observed ---------------------------------------------------------------------- Biometry  BPD:      87.4  mm     G. Age:  35w 2d         29  %    CI:        73.31   %    70 - 86                                                          FL/HC:      21.3   %    20.1 - 22.1  HC:      324.4  mm     G. Age:  36w 5d         28  %    HC/AC:      1.04        0.93 - 1.11  AC:       313   mm     G. Age:  35w 2d         27  %    FL/BPD:     79.1   %    71 - 87  FL:       69.1  mm     G. Age:  35w 3d         23  %    FL/AC:      22.1   %    20 - 24  HUM:      61.4  mm     G. Age:  35w 4d         50  %  CER:      47.8  mm     G. Age:  N/A          > 95  %  LV:  5.4  mm  Est. FW:    2683  gm    5 lb 15 oz       27  % ---------------------------------------------------------------------- OB History  Gravidity:    2         Term:   1        Prem:   0        SAB:   0  TOP:          0       Ectopic:  0        Living: 1 ---------------------------------------------------------------------- Gestational Age  LMP:           36w 3d        Date:  03/30/18                 EDD:   01/04/19  U/S Today:     35w 5d                                        EDD:   01/09/19  Best:          36w 3d     Det. By:  LMP  (03/30/18)          EDD:   01/04/19 ---------------------------------------------------------------------- Anatomy  Cranium:               Appears normal         Aortic Arch:            Appears normal  Cavum:                 Appears normal         Ductal Arch:            Appears normal  Ventricles:            Appears normal         Diaphragm:              Previously seen  Choroid Plexus:        Previously seen        Stomach:                Appears normal, left                                                                        sided  Cerebellum:            Appears normal         Abdomen:                Appears normal  Posterior Fossa:       Appears normal         Abdominal Wall:         Previously seen  Nuchal Fold:           Not applicable (>35    Cord Vessels:           Appears normal ([redacted]  wks GA)                                        vessel cord)  Face:                  Appears normal         Kidneys:                Appear normal                         (orbits and profile)  Lips:                  Appears normal         Bladder:                Appears normal  Thoracic:              Appears normal         Spine:                  Limited views prev                                                                        seen  Heart:                 Previously seen        Upper Extremities:      Previously seen  RVOT:                  Not well visualized    Lower Extremities:      Previously seen  LVOT:                   Appears normal  Other:  Female gender. Technically difficult due to maternal habitus and fetal          position. ---------------------------------------------------------------------- Cervix Uterus Adnexa  Cervix  Not visualized (advanced GA >24wks)  Left Ovary  Within normal limits.  Right Ovary  Within normal limits. ---------------------------------------------------------------------- Impression  Biophysical profile 8/8  Normal interval growth ---------------------------------------------------------------------- Recommendations  Follow up as clinically indicated. ----------------------------------------------------------------------               Sander Nephew, MD Electronically Signed Final Report   12/10/2018 11:06 am ----------------------------------------------------------------------  [place] Mfm Ob Follow Up  Result Date: 12/10/2018 ----------------------------------------------------------------------  OBSTETRICS REPORT                       (Signed Final 12/10/2018 11:06 am) ---------------------------------------------------------------------- Patient Info  ID #:       [id number]                          D.O.B.:  Oct 06, 1985 (33 yrs)  Name:       [facility]                Visit Date: 12/10/2018 10:13 am ---------------------------------------------------------------------- Performed By  Performed By:     Felecia Jan  Ref. Address:     [address] [place]                                                             [place], [place]  Attending:        Sander Nephew      [facility]                    MD                                       Fetal Care  Referred By:      Shelly Bombard MD ---------------------------------------------------------------------- Orders   #   Description                          Code         Ordered By   1  [place] MFM OB FOLLOW UP                  76816.01     RAVI SHANKAR   2  [place] MFM FETAL BPP WO NON              76819.01     [place]  ----------------------------------------------------------------------   #  Order #                    Accession #                 Episode #   1  [id number]                  [id number]                  [id number]   2  [id number]                  [id number]                  [id number]  ---------------------------------------------------------------------- Indications   Gestational diabetes in pregnancy, diet        O24.410   controlled   [redacted]  weeks gestation of pregnancy                Z3A.36   Encounter for other antenatal screening        Z36.2   follow-up   Obesity complicating pregnancy, third          O99.213   trimester (BMI 45)(Low Risk NIPS)(Negative   AFP)   Tobacco use complicating pregnancy, third      O99.333   trimester   History of cesarean delivery, currently        O34.219   pregnant  ---------------------------------------------------------------------- Vital Signs                                                 Height:        5'4" ---------------------------------------------------------------------- Fetal Evaluation  Num Of Fetuses:         1  Fetal Heart Rate(bpm):  151  Cardiac Activity:       Observed  Presentation:           Cephalic  Placenta:               Anterior  Amniotic Fluid  AFI FV:      Within normal limits  AFI Sum(cm)     %Tile       Largest Pocket(cm)  10.63           27          3.8  RUQ(cm)                     LUQ(cm)        LLQ(cm)  3.54                        3.8            3.29 ---------------------------------------------------------------------- Biophysical Evaluation  Amniotic F.V:   Within normal limits       F. Tone:        Observed  F. Movement:    Observed                   Score:          8/8  F. Breathing:   Observed  ---------------------------------------------------------------------- Biometry  BPD:      87.4  mm     G. Age:  35w 2d         29  %    CI:        73.31   %    70 - 86                                                          FL/HC:      21.3   %    20.1 - 22.1  HC:      324.4  mm     G. Age:  36w 5d         28  %    HC/AC:      1.04        0.93 - 1.11  AC:       313  mm     G. Age:  35w 2d         27  %    FL/BPD:     79.1   %    71 - 87  FL:       69.1  mm     G. Age:  35w 3d         23  %    FL/AC:      22.1   %    20 - 24  HUM:      61.4  mm     G. Age:  35w 4d         50  %  CER:      47.8  mm     G. Age:  N/A          > 95  %  LV:        5.4  mm  Est. FW:    2683  gm    5 lb 15 oz      27  % ---------------------------------------------------------------------- OB History  Gravidity:    2         Term:   1        Prem:   0        SAB:   0  TOP:          0       Ectopic:  0        Living: 1 ---------------------------------------------------------------------- Gestational Age  LMP:           36w 3d        Date:  03/30/18                 EDD:   01/04/19  U/S Today:     35w 5d                                        EDD:   01/09/19  Best:          36w 3d     Det. By:  LMP  (03/30/18)          EDD:   01/04/19 ---------------------------------------------------------------------- Anatomy  Cranium:               Appears normal         Aortic Arch:            Appears normal  Cavum:                 Appears normal         Ductal Arch:            Appears normal  Ventricles:            Appears normal         Diaphragm:              Previously seen  Choroid Plexus:        Previously seen        Stomach:                Appears normal, left  sided  Cerebellum:            Appears normal         Abdomen:                Appears normal  Posterior Fossa:       Appears normal         Abdominal Wall:         Previously seen  Nuchal Fold:           Not applicable  (>94    Cord Vessels:           Appears normal ([redacted]                         wks GA)                                        vessel cord)  Face:                  Appears normal         Kidneys:                Appear normal                         (orbits and profile)  Lips:                  Appears normal         Bladder:                Appears normal  Thoracic:              Appears normal         Spine:                  Limited views prev                                                                        seen  Heart:                 Previously seen        Upper Extremities:      Previously seen  RVOT:                  Not well visualized    Lower Extremities:      Previously seen  LVOT:                  Appears normal  Other:  Female gender. Technically difficult due to maternal habitus and fetal          position. ---------------------------------------------------------------------- Cervix Uterus Adnexa  Cervix  Not visualized (advanced GA >24wks)  Left Ovary  Within normal limits.  Right Ovary  Within normal limits. ---------------------------------------------------------------------- Impression  Biophysical profile 8/8  Normal interval growth ---------------------------------------------------------------------- Recommendations  Follow up as clinically indicated. ----------------------------------------------------------------------               Sander Nephew, MD Electronically Signed Final Report   12/10/2018 11:06 am ----------------------------------------------------------------------  Assessment and Plan:  Pregnancy: G2P1001 at [redacted]w[redacted]d 1. Supervision of other normal pregnancy, antepartum Patient is doing well, looking forward to her scheduled RCS  2. Gestational diabetes mellitus (GDM) in third trimester controlled on oral hypoglycemic drug CBGs reviewed and great majority within range on metformin Follow up BPP on 7/21  3. Group B Streptococcus carrier, +RV culture, currently  pregnant   4. Obesity during pregnancy, antepartum   Term labor symptoms and general obstetric precautions including but not limited to vaginal bleeding, contractions, leaking of fluid and fetal movement were reviewed in detail with the patient. I discussed the assessment and treatment plan with the patient. The patient was provided an opportunity to ask questions and all were answered. The patient agreed with the plan and demonstrated an understanding of the instructions. The patient was advised to call back or seek an in-person office evaluation/go to [facility]U at [facility] for any urgent or concerning symptoms. Please refer to After Visit Summary for other counseling recommendations.   I provided 11 minutes of face-to-face time during this encounter.  No follow-ups on file.  Future Appointments  Date Time Provider [place]  12/23/2018 11:30 AM [place] [facility]  12/23/2018 11:30 AM [facility] [place] 5 [facility]  12/25/2018  8:30 AM [facility] 1 [facility] None    Mora Bellman, MD [facility], [place]

## 2018-12-23 ENCOUNTER — Encounter (HOSPITAL_COMMUNITY): Payer: Self-pay

## 2018-12-23 ENCOUNTER — Ambulatory Visit (HOSPITAL_COMMUNITY)
Admission: RE | Admit: 2018-12-23 | Discharge: 2018-12-23 | Disposition: A | Discharge status: Home / Self Care | Payer: Medicaid Other | Source: Ambulatory Visit | Attending: Obstetrics and Gynecology | Admitting: Obstetrics and Gynecology

## 2018-12-23 ENCOUNTER — Ambulatory Visit (HOSPITAL_COMMUNITY): Payer: Medicaid Other | Admitting: *Deleted

## 2018-12-23 ENCOUNTER — Other Ambulatory Visit: Payer: Self-pay

## 2018-12-23 VITALS — BP 128/86 | HR 84 | Temp 98.6°F

## 2018-12-23 DIAGNOSIS — O24415 Gestational diabetes mellitus in pregnancy, controlled by oral hypoglycemic drugs: Secondary | ICD-10-CM

## 2018-12-23 DIAGNOSIS — O99333 Smoking (tobacco) complicating pregnancy, third trimester: Secondary | ICD-10-CM | POA: Diagnosis not present

## 2018-12-23 DIAGNOSIS — O34219 Maternal care for unspecified type scar from previous cesarean delivery: Secondary | ICD-10-CM | POA: Diagnosis not present

## 2018-12-23 DIAGNOSIS — O99213 Obesity complicating pregnancy, third trimester: Secondary | ICD-10-CM

## 2018-12-23 DIAGNOSIS — O24410 Gestational diabetes mellitus in pregnancy, diet controlled: Secondary | ICD-10-CM

## 2018-12-23 DIAGNOSIS — Z3A38 38 weeks gestation of pregnancy: Secondary | ICD-10-CM

## 2018-12-25 ENCOUNTER — Other Ambulatory Visit (HOSPITAL_COMMUNITY)
Admission: RE | Admit: 2018-12-25 | Discharge: 2018-12-25 | Disposition: A | Discharge status: Home / Self Care | Payer: Medicaid Other | Source: Ambulatory Visit | Attending: Obstetrics & Gynecology | Admitting: Obstetrics & Gynecology

## 2018-12-25 ENCOUNTER — Other Ambulatory Visit: Payer: Self-pay

## 2018-12-25 DIAGNOSIS — Z1159 Encounter for screening for other viral diseases: Secondary | ICD-10-CM | POA: Diagnosis not present

## 2018-12-25 LAB — SARS CORONAVIRUS 2 (TAT 6-24 HRS): SARS Coronavirus 2: NEGATIVE

## 2018-12-25 NOTE — MAU Note (Signed)
Asymptomatic, swab collected.

## 2018-12-26 ENCOUNTER — Ambulatory Visit (INDEPENDENT_AMBULATORY_CARE_PROVIDER_SITE_OTHER): Payer: Medicaid Other | Admitting: Obstetrics

## 2018-12-26 ENCOUNTER — Encounter: Payer: Self-pay | Admitting: Obstetrics

## 2018-12-26 DIAGNOSIS — Z3A38 38 weeks gestation of pregnancy: Secondary | ICD-10-CM

## 2018-12-26 DIAGNOSIS — O24415 Gestational diabetes mellitus in pregnancy, controlled by oral hypoglycemic drugs: Secondary | ICD-10-CM

## 2018-12-26 DIAGNOSIS — O0990 Supervision of high risk pregnancy, unspecified, unspecified trimester: Secondary | ICD-10-CM

## 2018-12-26 DIAGNOSIS — Z98891 History of uterine scar from previous surgery: Secondary | ICD-10-CM

## 2018-12-26 DIAGNOSIS — O0993 Supervision of high risk pregnancy, unspecified, third trimester: Secondary | ICD-10-CM

## 2018-12-26 NOTE — Progress Notes (Signed)
I connected with  [name] on 12/26/18 by a video enabled telemedicine application and verified that I am speaking with the correct person using two identifiers.   WebEX ROB.  She is unable to check her BP, Cuff is not working.  Her Fasting BS is 94.  Reports no problem today.

## 2018-12-26 NOTE — Progress Notes (Signed)
TELEHEALTH OBSTETRICS PRENATAL VIRTUAL VIDEO VISIT ENCOUNTER NOTE  Provider location: [facility] at [place]   I connected with Ashley Costa on 12/26/18 at  9:45 AM EDT by WebEx OB MyChart Video Encounter at home and verified that I am speaking with the correct person using two identifiers.   I discussed the limitations, risks, security and privacy concerns of performing an evaluation and management service virtually and the availability of in person appointments. I also discussed with the patient that there may be a patient responsible charge related to this service. The patient expressed understanding and agreed to proceed. Subjective:  Ashley Costa is a 33 y.o. G2P1001 at [redacted]w[redacted]d being seen today for ongoing prenatal care.  She is currently monitored for the following issues for this high-risk pregnancy and has Tobacco abuse; Obesity, Class III, BMI 40-49.9 (morbid obesity) ([place]); Pregnancy with history of infertility; Obesity during pregnancy, antepartum; Status post primary low transverse cesarean section; Supervision of other normal pregnancy, antepartum; History of primary cesarean section; HSIL (high grade squamous intraepithelial lesion) on Pap smear of cervix; Gestational diabetes; and Group B Streptococcus carrier, +RV culture, currently pregnant on their problem list.  Patient reports no complaints.  Contractions: Not present.  .  Movement: Present. Denies any leaking of fluid.   The following portions of the patient's history were reviewed and updated as appropriate: allergies, current medications, past family history, past medical history, past social history, past surgical history and problem list.   Objective:  There were no vitals filed for this visit.  Fetal Status:     Movement: Present     General:  Alert, oriented and cooperative. Patient is in no acute distress.  Respiratory: Normal respiratory effort, no problems with respiration noted  Mental  Status: Normal mood and affect. Normal behavior. Normal judgment and thought content.  Rest of physical exam deferred due to type of encounter  Imaging: [place] Mfm Fetal Bpp Wo Non Stress  Result Date: 12/23/2018 ----------------------------------------------------------------------  OBSTETRICS REPORT                       (Signed Final 12/23/2018 04:48 pm) ---------------------------------------------------------------------- Patient Info  ID #:       [id number]                          D.O.B.:  10-28-1985 (33 yrs)  Name:       Ashley Costa                Visit Date: 12/23/2018 11:45 am ---------------------------------------------------------------------- Performed By  Performed By:     Ellin Saba          Ref. Address:     [address], [place], [place] [address]  Attending:        Sander Nephew      [facility]                                       [facility]  Referred By:      Shelly Bombard MD ---------------------------------------------------------------------- Orders   #  Description                          Code         Ordered By   1  [place] MFM FETAL BPP WO NON              76819.01     TANYA PRATT      STRESS  ----------------------------------------------------------------------   #  Order #                    Accession #                 Episode #   1  [id number]                  [id number]                  [id number]  ---------------------------------------------------------------------- Indications   [redacted] weeks gestation of pregnancy                Z3A.38   Gestational diabetes in pregnancy, diet        O24.410   controlled   Encounter for other antenatal screening        Z36.2   follow-up   Obesity complicating  pregnancy, third          O99.213   trimester (BMI 45)(Low Risk NIPS)(Negative   AFP)   Tobacco use complicating pregnancy, third      O99.333   trimester   History of cesarean delivery, currently        O34.219   pregnant  ---------------------------------------------------------------------- Vital Signs  Weight (lb): 266                               Height:        5'4"  BMI:         45.65 ---------------------------------------------------------------------- Fetal Evaluation  Num Of Fetuses:         1  Fetal Heart Rate(bpm):  143  Cardiac Activity:       Observed  Presentation:           Cephalic  Placenta:               Anterior  P. Cord Insertion:  Visualized, central  Amniotic Fluid  AFI FV:      Within normal limits  AFI Sum(cm)     %Tile       Largest Pocket(cm)  8.39            14          3.68  RUQ(cm)       RLQ(cm)       LUQ(cm)        LLQ(cm)  3.68          2.3           0              2.41 ---------------------------------------------------------------------- Biophysical Evaluation  Amniotic F.V:   Within normal limits       F. Tone:        Observed  F. Movement:    Observed                   Score:          8/8  F. Breathing:   Observed ---------------------------------------------------------------------- OB History  Gravidity:    2         Term:   1        Prem:   0        SAB:   0  TOP:          0       Ectopic:  0        Living: 1 ---------------------------------------------------------------------- Gestational Age  LMP:           38w 2d        Date:  03/30/18                 EDD:   01/04/19  Best:          38w 2d     Det. By:  LMP  (03/30/18)          EDD:   01/04/19 ---------------------------------------------------------------------- Anatomy  Diaphragm:             Appears normal         Kidneys:                Appear normal  Stomach:               Appears normal, left   Bladder:                Appears normal                         sided  ---------------------------------------------------------------------- Impression  Biophysical profile 8/8  [place] delivery on 7/26 ---------------------------------------------------------------------- Recommendations  Follow up as clinically indicated. ----------------------------------------------------------------------               Sander Nephew, MD Electronically Signed Final Report   12/23/2018 04:48 pm ----------------------------------------------------------------------  [place] Mfm Fetal Bpp Wo Non Stress  Result Date: 12/10/2018 ----------------------------------------------------------------------  OBSTETRICS REPORT                       (Signed Final 12/10/2018 11:06 am) ---------------------------------------------------------------------- Patient Info  ID #:       [id number]                          D.O.B.:  1985-10-04 (33 yrs)  Name:       [facility]  Visit Date: 12/10/2018 10:13 am ---------------------------------------------------------------------- Performed By  Performed By:     Felecia Jan        Ref. Address:     [address], [place]                                                             [place], [place]  Attending:        Sander Nephew      [facility]                    MD                                       Fetal Care  Referred By:      Shelly Bombard MD ---------------------------------------------------------------------- Orders   #  Description                          Code         Ordered By   1  [place] MFM OB FOLLOW UP                  76816.01     RAVI SHANKAR   2  [place] MFM FETAL BPP WO NON              76819.01     Nora  ----------------------------------------------------------------------   #  Order #                    Accession #                  Episode #   1  [id number]                  [id number]                  [id number]   2  [id number]                  [id number]  [id number]  ---------------------------------------------------------------------- Indications   Gestational diabetes in pregnancy, diet        O24.410   controlled   [redacted] weeks gestation of pregnancy                Z3A.36   Encounter for other antenatal screening        Z36.2   follow-up   Obesity complicating pregnancy, third          O99.213   trimester (BMI 45)(Low Risk NIPS)(Negative   AFP)   Tobacco use complicating pregnancy, third      O99.333   trimester   History of cesarean delivery, currently        O34.219   pregnant  ---------------------------------------------------------------------- Vital Signs                                                 Height:        5'4" ---------------------------------------------------------------------- Fetal Evaluation  Num Of Fetuses:         1  Fetal Heart Rate(bpm):  151  Cardiac Activity:       Observed  Presentation:           Cephalic  Placenta:               Anterior  Amniotic Fluid  AFI FV:      Within normal limits  AFI Sum(cm)     %Tile       Largest Pocket(cm)  10.63           27          3.8  RUQ(cm)                     LUQ(cm)        LLQ(cm)  3.54                        3.8            3.29 ---------------------------------------------------------------------- Biophysical Evaluation  Amniotic F.V:   Within normal limits       F. Tone:        Observed  F. Movement:    Observed                   Score:          8/8  F. Breathing:   Observed ---------------------------------------------------------------------- Biometry  BPD:      87.4  mm     G. Age:  35w 2d         29  %    CI:        73.31   %    70 - 86                                                          FL/HC:      21.3   %    20.1 - 22.1  HC:      324.4  mm     G. Age:  36w 5d         28  %    HC/AC:  1.04        0.93 - 1.11  AC:       313   mm      G. Age:  35w 2d         27  %    FL/BPD:     79.1   %    71 - 87  FL:       69.1  mm     G. Age:  35w 3d         23  %    FL/AC:      22.1   %    20 - 24  HUM:      61.4  mm     G. Age:  35w 4d         50  %  CER:      47.8  mm     G. Age:  N/A          > 95  %  LV:        5.4  mm  Est. FW:    2683  gm    5 lb 15 oz      27  % ---------------------------------------------------------------------- OB History  Gravidity:    2         Term:   1        Prem:   0        SAB:   0  TOP:          0       Ectopic:  0        Living: 1 ---------------------------------------------------------------------- Gestational Age  LMP:           36w 3d        Date:  03/30/18                 EDD:   01/04/19  U/S Today:     35w 5d                                        EDD:   01/09/19  Best:          36w 3d     Det. By:  LMP  (03/30/18)          EDD:   01/04/19 ---------------------------------------------------------------------- Anatomy  Cranium:               Appears normal         Aortic Arch:            Appears normal  Cavum:                 Appears normal         Ductal Arch:            Appears normal  Ventricles:            Appears normal         Diaphragm:              Previously seen  Choroid Plexus:        Previously seen        Stomach:                Appears normal, left  sided  Cerebellum:            Appears normal         Abdomen:                Appears normal  Posterior Fossa:       Appears normal         Abdominal Wall:         Previously seen  Nuchal Fold:           Not applicable (>47    Cord Vessels:           Appears normal ([redacted]                         wks GA)                                        vessel cord)  Face:                  Appears normal         Kidneys:                Appear normal                         (orbits and profile)  Lips:                  Appears normal         Bladder:                Appears normal  Thoracic:               Appears normal         Spine:                  Limited views prev                                                                        seen  Heart:                 Previously seen        Upper Extremities:      Previously seen  RVOT:                  Not well visualized    Lower Extremities:      Previously seen  LVOT:                  Appears normal  Other:  Female gender. Technically difficult due to maternal habitus and fetal          position. ---------------------------------------------------------------------- Cervix Uterus Adnexa  Cervix  Not visualized (advanced GA >24wks)  Left Ovary  Within normal limits.  Right Ovary  Within normal limits. ---------------------------------------------------------------------- Impression  Biophysical profile 8/8  Normal interval growth ---------------------------------------------------------------------- Recommendations  Follow up as clinically indicated. ----------------------------------------------------------------------               Sander Nephew, MD Electronically Signed Final Report   12/10/2018 11:06 am ----------------------------------------------------------------------  [place] Mfm Ob Follow Up  Result Date: 12/10/2018 ----------------------------------------------------------------------  OBSTETRICS REPORT                       (Signed Final 12/10/2018 11:06 am) ---------------------------------------------------------------------- Patient Info  ID #:       [id number]                          D.O.B.:  1985/10/25 (33 yrs)  Name:       [facility]                Visit Date: 12/10/2018 10:13 am ---------------------------------------------------------------------- Performed By  Performed By:     Felecia Jan        Ref. Address:     [address], [place]                                                             [place], [place]  Attending:        Sander Nephew      [facility]                                       [facility] Care  Referred By:      Shelly Bombard MD ---------------------------------------------------------------------- Orders   #  Description                          Code         Ordered By   1  [place] MFM OB FOLLOW UP                  16606.30     RAVI SHANKAR   2  [place] MFM FETAL BPP WO NON              16010.93     KELLY DAVIS      STRESS  ----------------------------------------------------------------------   #  Order #  Accession #                 Episode #   1  [id number]                  [id number]                  [id number]   2  [id number]                  [id number]                  [id number]  ---------------------------------------------------------------------- Indications   Gestational diabetes in pregnancy, diet        O24.410   controlled   [redacted] weeks gestation of pregnancy                Z3A.36   Encounter for other antenatal screening        Z36.2   follow-up   Obesity complicating pregnancy, third          O99.213   trimester (BMI 45)(Low Risk NIPS)(Negative   AFP)   Tobacco use complicating pregnancy, third      O99.333   trimester   History of cesarean delivery, currently        O34.219   pregnant  ---------------------------------------------------------------------- Vital Signs                                                 Height:        5'4" ---------------------------------------------------------------------- Fetal Evaluation  Num Of Fetuses:         1  Fetal Heart Rate(bpm):  151  Cardiac Activity:       Observed  Presentation:           Cephalic  Placenta:               Anterior  Amniotic Fluid  AFI FV:      Within normal limits  AFI Sum(cm)     %Tile       Largest Pocket(cm)  10.63           27          3.8  RUQ(cm)                     LUQ(cm)        LLQ(cm)  3.54                        3.8             3.29 ---------------------------------------------------------------------- Biophysical Evaluation  Amniotic F.V:   Within normal limits       F. Tone:        Observed  F. Movement:    Observed                   Score:          8/8  F. Breathing:   Observed ---------------------------------------------------------------------- Biometry  BPD:      87.4  mm     G. Age:  35w 2d         29  %    CI:        73.31   %    70 - 86  FL/HC:      21.3   %    20.1 - 22.1  HC:      324.4  mm     G. Age:  36w 5d         28  %    HC/AC:      1.04        0.93 - 1.11  AC:       313   mm     G. Age:  35w 2d         27  %    FL/BPD:     79.1   %    71 - 87  FL:       69.1  mm     G. Age:  35w 3d         23  %    FL/AC:      22.1   %    20 - 24  HUM:      61.4  mm     G. Age:  35w 4d         50  %  CER:      47.8  mm     G. Age:  N/A          > 95  %  LV:        5.4  mm  Est. FW:    2683  gm    5 lb 15 oz      27  % ---------------------------------------------------------------------- OB History  Gravidity:    2         Term:   1        Prem:   0        SAB:   0  TOP:          0       Ectopic:  0        Living: 1 ---------------------------------------------------------------------- Gestational Age  LMP:           36w 3d        Date:  03/30/18                 EDD:   01/04/19  U/S Today:     35w 5d                                        EDD:   01/09/19  Best:          36w 3d     Det. By:  LMP  (03/30/18)          EDD:   01/04/19 ---------------------------------------------------------------------- Anatomy  Cranium:               Appears normal         Aortic Arch:            Appears normal  Cavum:                 Appears normal         Ductal Arch:            Appears normal  Ventricles:            Appears normal         Diaphragm:              Previously seen  Choroid Plexus:  Previously seen        Stomach:                Appears normal, left                                                                         sided  Cerebellum:            Appears normal         Abdomen:                Appears normal  Posterior Fossa:       Appears normal         Abdominal Wall:         Previously seen  Nuchal Fold:           Not applicable (>82    Cord Vessels:           Appears normal ([redacted]                         wks GA)                                        vessel cord)  Face:                  Appears normal         Kidneys:                Appear normal                         (orbits and profile)  Lips:                  Appears normal         Bladder:                Appears normal  Thoracic:              Appears normal         Spine:                  Limited views prev                                                                        seen  Heart:                 Previously seen        Upper Extremities:      Previously seen  RVOT:                  Not well visualized    Lower Extremities:      Previously seen  LVOT:  Appears normal  Other:  Female gender. Technically difficult due to maternal habitus and fetal          position. ---------------------------------------------------------------------- Cervix Uterus Adnexa  Cervix  Not visualized (advanced GA >24wks)  Left Ovary  Within normal limits.  Right Ovary  Within normal limits. ---------------------------------------------------------------------- Impression  Biophysical profile 8/8  Normal interval growth ---------------------------------------------------------------------- Recommendations  Follow up as clinically indicated. ----------------------------------------------------------------------               Sander Nephew, MD Electronically Signed Final Report   12/10/2018 11:06 am ----------------------------------------------------------------------   Assessment and Plan:  Pregnancy: G2P1001 at [redacted]w[redacted]d 1. Supervision of high risk pregnancy, antepartum  2. Gestational diabetes mellitus (GDM) in third  trimester controlled on oral hypoglycemic drug  3. History of primary cesarean section - repeat C/S scheduled for 12-28-2018.  Term labor symptoms and general obstetric precautions including but not limited to vaginal bleeding, contractions, leaking of fluid and fetal movement were reviewed in detail with the patient. I discussed the assessment and treatment plan with the patient. The patient was provided an opportunity to ask questions and all were answered. The patient agreed with the plan and demonstrated an understanding of the instructions. The patient was advised to call back or seek an in-person office evaluation/go to [facility] at [facility] for any urgent or concerning symptoms. Please refer to After Visit Summary for other counseling recommendations.   I provided 10 minutes of face-to-face time during this encounter.  Return in about 2 weeks (around 01/09/2019) for Postpartum.    Baltazar Najjar, MD [facility], [facility] 12-26-2018

## 2018-12-28 ENCOUNTER — Inpatient Hospital Stay (HOSPITAL_COMMUNITY): Payer: Medicaid Other | Admitting: Anesthesiology

## 2018-12-28 ENCOUNTER — Inpatient Hospital Stay (HOSPITAL_COMMUNITY)
Admission: RE | Admit: 2018-12-28 | Discharge: 2018-12-30 | DRG: 787 | Disposition: A | Discharge status: Home / Self Care | Payer: Medicaid Other | Attending: Obstetrics & Gynecology | Admitting: Obstetrics & Gynecology

## 2018-12-28 ENCOUNTER — Encounter (HOSPITAL_COMMUNITY)
Admission: RE | Disposition: A | Discharge status: Home / Self Care | Payer: Self-pay | Source: Home / Self Care | Attending: Obstetrics & Gynecology

## 2018-12-28 ENCOUNTER — Other Ambulatory Visit: Payer: Self-pay

## 2018-12-28 ENCOUNTER — Encounter (HOSPITAL_COMMUNITY): Payer: Self-pay

## 2018-12-28 DIAGNOSIS — O99824 Streptococcus B carrier state complicating childbirth: Secondary | ICD-10-CM | POA: Diagnosis present

## 2018-12-28 DIAGNOSIS — O34211 Maternal care for low transverse scar from previous cesarean delivery: Secondary | ICD-10-CM | POA: Diagnosis present

## 2018-12-28 DIAGNOSIS — O9081 Anemia of the puerperium: Secondary | ICD-10-CM | POA: Diagnosis not present

## 2018-12-28 DIAGNOSIS — D62 Acute posthemorrhagic anemia: Secondary | ICD-10-CM | POA: Diagnosis not present

## 2018-12-28 DIAGNOSIS — Z87891 Personal history of nicotine dependence: Secondary | ICD-10-CM

## 2018-12-28 DIAGNOSIS — Z30017 Encounter for initial prescription of implantable subdermal contraceptive: Secondary | ICD-10-CM

## 2018-12-28 DIAGNOSIS — O24415 Gestational diabetes mellitus in pregnancy, controlled by oral hypoglycemic drugs: Secondary | ICD-10-CM | POA: Diagnosis present

## 2018-12-28 DIAGNOSIS — O99214 Obesity complicating childbirth: Secondary | ICD-10-CM | POA: Diagnosis present

## 2018-12-28 DIAGNOSIS — O24425 Gestational diabetes mellitus in childbirth, controlled by oral hypoglycemic drugs: Secondary | ICD-10-CM | POA: Diagnosis present

## 2018-12-28 DIAGNOSIS — Z3A39 39 weeks gestation of pregnancy: Secondary | ICD-10-CM | POA: Diagnosis not present

## 2018-12-28 DIAGNOSIS — Z98891 History of uterine scar from previous surgery: Secondary | ICD-10-CM

## 2018-12-28 DIAGNOSIS — Z975 Presence of (intrauterine) contraceptive device: Secondary | ICD-10-CM

## 2018-12-28 DIAGNOSIS — O0990 Supervision of high risk pregnancy, unspecified, unspecified trimester: Secondary | ICD-10-CM

## 2018-12-28 DIAGNOSIS — O418X30 Other specified disorders of amniotic fluid and membranes, third trimester, not applicable or unspecified: Secondary | ICD-10-CM | POA: Diagnosis not present

## 2018-12-28 HISTORY — DX: Unspecified abnormal cytological findings in specimens from cervix uteri: R87.619

## 2018-12-28 LAB — COMPREHENSIVE METABOLIC PANEL
ALT: 12 U/L (ref 0–44)
AST: 16 U/L (ref 15–41)
Albumin: 2.7 g/dL — ABNORMAL LOW (ref 3.5–5.0)
Alkaline Phosphatase: 117 U/L (ref 38–126)
Anion gap: 10 (ref 5–15)
BUN: 7 mg/dL (ref 6–20)
CO2: 17 mmol/L — ABNORMAL LOW (ref 22–32)
Calcium: 8.7 mg/dL — ABNORMAL LOW (ref 8.9–10.3)
Chloride: 110 mmol/L (ref 98–111)
Creatinine, Ser: 1.05 mg/dL — ABNORMAL HIGH (ref 0.44–1.00)
GFR calc Af Amer: >60 mL/min (ref >60)
GFR calc non Af Amer: >60 mL/min (ref >60)
Glucose, Bld: 77 mg/dL (ref 70–99)
Potassium: 3.7 mmol/L (ref 3.5–5.1)
Sodium: 137 mmol/L (ref 135–145)
Total Bilirubin: 0.6 mg/dL (ref 0.3–1.2)
Total Protein: 6.2 g/dL — ABNORMAL LOW (ref 6.5–8.1)

## 2018-12-28 LAB — CBC
HCT: 35.1 % — ABNORMAL LOW (ref 36.0–46.0)
Hemoglobin: 11.7 g/dL — ABNORMAL LOW (ref 12.0–15.0)
MCH: 27.5 pg (ref 26.0–34.0)
MCHC: 33.3 g/dL (ref 30.0–36.0)
MCV: 82.4 fL (ref 80.0–100.0)
Platelets: 300 10*3/uL (ref 150–400)
RBC: 4.26 MIL/uL (ref 3.87–5.11)
RDW: 15.6 % — ABNORMAL HIGH (ref 11.5–15.5)
WBC: 8 10*3/uL (ref 4.0–10.5)
nRBC: 0 % (ref 0.0–0.2)

## 2018-12-28 LAB — GLUCOSE, CAPILLARY
Glucose-Capillary: 71 mg/dL (ref 70–99)
Glucose-Capillary: 66 mg/dL — ABNORMAL LOW (ref 70–99)

## 2018-12-28 LAB — TYPE AND SCREEN
ABO/RH(D): O POS
Antibody Screen: NEGATIVE

## 2018-12-28 LAB — RPR: RPR Ser Ql: NONREACTIVE

## 2018-12-28 SURGERY — Surgical Case
Anesthesia: Spinal

## 2018-12-28 MED ORDER — ONDANSETRON HCL 4 MG/2ML IJ SOLN
INTRAMUSCULAR | Status: AC
  Filled 2018-12-28: qty 2

## 2018-12-28 MED ORDER — ONDANSETRON HCL 4 MG/2ML IJ SOLN
4.0000 mg | Freq: Three times a day (TID) | INTRAMUSCULAR | Status: DC | PRN
  Administered 2018-12-28: 12:00:00 4 mg via INTRAVENOUS

## 2018-12-28 MED ORDER — NALBUPHINE HCL 10 MG/ML IJ SOLN: 5.0000 mg | Freq: Once | INTRAMUSCULAR | Status: DC | PRN

## 2018-12-28 MED ORDER — DIPHENHYDRAMINE HCL 25 MG PO CAPS
25.0000 mg | ORAL_CAPSULE | ORAL | Status: DC | PRN
  Filled 2018-12-28: qty 1

## 2018-12-28 MED ORDER — NALOXONE HCL 4 MG/10ML IJ SOLN
1.0000 ug/kg/h | INTRAVENOUS | Status: DC | PRN
  Filled 2018-12-28: qty 5

## 2018-12-28 MED ORDER — SCOPOLAMINE 1 MG/3DAYS TD PT72
MEDICATED_PATCH | TRANSDERMAL | Status: AC
  Filled 2018-12-28: qty 1

## 2018-12-28 MED ORDER — SODIUM CHLORIDE 0.9 % IV SOLN
INTRAVENOUS | Status: AC
  Filled 2018-12-28: qty 2

## 2018-12-28 MED ORDER — KETOROLAC TROMETHAMINE 30 MG/ML IJ SOLN
30.0000 mg | Freq: Four times a day (QID) | INTRAMUSCULAR | Status: AC
  Administered 2018-12-28 – 2018-12-29 (×4): 30 mg via INTRAVENOUS
  Filled 2018-12-28 (×4): qty 1

## 2018-12-28 MED ORDER — PHENYLEPHRINE HCL-NACL 20-0.9 MG/250ML-% IV SOLN
INTRAVENOUS | Status: AC
  Filled 2018-12-28: qty 250

## 2018-12-28 MED ORDER — SOD CITRATE-CITRIC ACID 500-334 MG/5ML PO SOLN
ORAL | Status: AC
  Filled 2018-12-28: qty 30

## 2018-12-28 MED ORDER — TRANEXAMIC ACID-NACL 1000-0.7 MG/100ML-% IV SOLN
INTRAVENOUS | Status: AC
  Filled 2018-12-28: qty 100

## 2018-12-28 MED ORDER — FERROUS SULFATE 325 (65 FE) MG PO TABS
325.0000 mg | ORAL_TABLET | Freq: Two times a day (BID) | ORAL | Status: DC
  Administered 2018-12-29 – 2018-12-30 (×3): 325 mg via ORAL
  Filled 2018-12-28 (×4): qty 1

## 2018-12-28 MED ORDER — MORPHINE SULFATE (PF) 0.5 MG/ML IJ SOLN
INTRAMUSCULAR | Status: AC
  Filled 2018-12-28: qty 10

## 2018-12-28 MED ORDER — PHENYLEPHRINE 40 MCG/ML (10ML) SYRINGE FOR IV PUSH (FOR BLOOD PRESSURE SUPPORT)
PREFILLED_SYRINGE | INTRAVENOUS | Status: AC
  Filled 2018-12-28: qty 10

## 2018-12-28 MED ORDER — FENTANYL CITRATE (PF) 100 MCG/2ML IJ SOLN
INTRAMUSCULAR | Status: DC | PRN
  Administered 2018-12-28: 15 ug via INTRATHECAL

## 2018-12-28 MED ORDER — OXYTOCIN 40 UNITS IN NORMAL SALINE INFUSION - SIMPLE MED: 2.5000 [IU]/h | INTRAVENOUS | Status: AC

## 2018-12-28 MED ORDER — FENTANYL CITRATE (PF) 100 MCG/2ML IJ SOLN
INTRAMUSCULAR | Status: AC
  Filled 2018-12-28: qty 2

## 2018-12-28 MED ORDER — ZOLPIDEM TARTRATE 5 MG PO TABS: 5.0000 mg | ORAL_TABLET | Freq: Every evening | ORAL | Status: DC | PRN

## 2018-12-28 MED ORDER — MEASLES, MUMPS & RUBELLA VAC IJ SOLR: 0.5000 mL | Freq: Once | INTRAMUSCULAR | Status: DC

## 2018-12-28 MED ORDER — KETOROLAC TROMETHAMINE 30 MG/ML IJ SOLN
30.0000 mg | Freq: Once | INTRAMUSCULAR | Status: AC | PRN
  Administered 2018-12-28: 30 mg via INTRAVENOUS

## 2018-12-28 MED ORDER — PHENYLEPHRINE HCL-NACL 20-0.9 MG/250ML-% IV SOLN
INTRAVENOUS | Status: DC | PRN
  Administered 2018-12-28: 60 ug/min via INTRAVENOUS

## 2018-12-28 MED ORDER — OXYCODONE-ACETAMINOPHEN 5-325 MG PO TABS
1.0000 | ORAL_TABLET | ORAL | Status: DC | PRN
  Administered 2018-12-29 – 2018-12-30 (×4): 1 via ORAL
  Filled 2018-12-28 (×4): qty 1

## 2018-12-28 MED ORDER — NALBUPHINE HCL 10 MG/ML IJ SOLN: 5.0000 mg | INTRAMUSCULAR | Status: DC | PRN

## 2018-12-28 MED ORDER — PHENYLEPHRINE 40 MCG/ML (10ML) SYRINGE FOR IV PUSH (FOR BLOOD PRESSURE SUPPORT)
PREFILLED_SYRINGE | INTRAVENOUS | Status: DC | PRN
  Administered 2018-12-28 (×3): 80 ug via INTRAVENOUS

## 2018-12-28 MED ORDER — PROMETHAZINE HCL 25 MG/ML IJ SOLN
6.2500 mg | Freq: Once | INTRAMUSCULAR | Status: AC
  Administered 2018-12-28: 13:00:00 6.25 mg via INTRAVENOUS

## 2018-12-28 MED ORDER — IBUPROFEN 800 MG PO TABS
800.0000 mg | ORAL_TABLET | Freq: Four times a day (QID) | ORAL | Status: DC
  Administered 2018-12-29 – 2018-12-30 (×4): 800 mg via ORAL
  Filled 2018-12-28 (×4): qty 1

## 2018-12-28 MED ORDER — SODIUM CHLORIDE 0.9% FLUSH: 3.0000 mL | INTRAVENOUS | Status: DC | PRN

## 2018-12-28 MED ORDER — NALOXONE HCL 0.4 MG/ML IJ SOLN: 0.4000 mg | INTRAMUSCULAR | Status: DC | PRN

## 2018-12-28 MED ORDER — BUPIVACAINE IN DEXTROSE 0.75-8.25 % IT SOLN
INTRATHECAL | Status: DC | PRN
  Administered 2018-12-28: 1.8 mg via INTRATHECAL

## 2018-12-28 MED ORDER — METOCLOPRAMIDE HCL 5 MG/ML IJ SOLN
INTRAMUSCULAR | Status: DC | PRN
  Administered 2018-12-28 (×2): 5 mg via INTRAVENOUS

## 2018-12-28 MED ORDER — KETOROLAC TROMETHAMINE 30 MG/ML IJ SOLN
INTRAMUSCULAR | Status: AC
  Filled 2018-12-28: qty 1

## 2018-12-28 MED ORDER — ACETAMINOPHEN 500 MG PO TABS
1000.0000 mg | ORAL_TABLET | Freq: Four times a day (QID) | ORAL | Status: AC
  Administered 2018-12-28 – 2018-12-29 (×3): 1000 mg via ORAL
  Filled 2018-12-28 (×3): qty 2

## 2018-12-28 MED ORDER — KETOROLAC TROMETHAMINE 30 MG/ML IJ SOLN: 30.0000 mg | Freq: Four times a day (QID) | INTRAMUSCULAR | Status: AC | PRN

## 2018-12-28 MED ORDER — LACTATED RINGERS IV SOLN
INTRAVENOUS | Status: DC
  Administered 2018-12-28 (×2): via INTRAVENOUS

## 2018-12-28 MED ORDER — OXYCODONE-ACETAMINOPHEN 5-325 MG PO TABS
2.0000 | ORAL_TABLET | ORAL | Status: DC | PRN
  Administered 2018-12-30: 2 via ORAL
  Filled 2018-12-28: qty 2

## 2018-12-28 MED ORDER — COCONUT OIL OIL: 1.0000 "application " | TOPICAL_OIL | Status: DC | PRN

## 2018-12-28 MED ORDER — ENOXAPARIN SODIUM 40 MG/0.4ML ~~LOC~~ SOLN: 40.0000 mg | SUBCUTANEOUS | Status: DC

## 2018-12-28 MED ORDER — GABAPENTIN 300 MG PO CAPS
300.0000 mg | ORAL_CAPSULE | Freq: Two times a day (BID) | ORAL | Status: DC
  Administered 2018-12-28 – 2018-12-30 (×4): 300 mg via ORAL
  Filled 2018-12-28 (×4): qty 1

## 2018-12-28 MED ORDER — SCOPOLAMINE 1 MG/3DAYS TD PT72
1.0000 | MEDICATED_PATCH | Freq: Once | TRANSDERMAL | Status: DC
  Administered 2018-12-28: 1.5 mg via TRANSDERMAL

## 2018-12-28 MED ORDER — SIMETHICONE 80 MG PO CHEW
80.0000 mg | CHEWABLE_TABLET | ORAL | Status: DC
  Administered 2018-12-29 (×2): 80 mg via ORAL
  Filled 2018-12-28 (×2): qty 1

## 2018-12-28 MED ORDER — DIPHENHYDRAMINE HCL 50 MG/ML IJ SOLN: 12.5000 mg | INTRAMUSCULAR | Status: DC | PRN

## 2018-12-28 MED ORDER — MENTHOL 3 MG MT LOZG: 1.0000 | LOZENGE | OROMUCOSAL | Status: DC | PRN

## 2018-12-28 MED ORDER — TETANUS-DIPHTH-ACELL PERTUSSIS 5-2.5-18.5 LF-MCG/0.5 IM SUSP: 0.5000 mL | Freq: Once | INTRAMUSCULAR | Status: DC

## 2018-12-28 MED ORDER — OXYTOCIN 40 UNITS IN NORMAL SALINE INFUSION - SIMPLE MED
INTRAVENOUS | Status: AC
  Filled 2018-12-28: qty 1000

## 2018-12-28 MED ORDER — PRENATAL MULTIVITAMIN CH
1.0000 | ORAL_TABLET | Freq: Every day | ORAL | Status: DC
  Administered 2018-12-29 – 2018-12-30 (×2): 1 via ORAL
  Filled 2018-12-28 (×2): qty 1

## 2018-12-28 MED ORDER — WITCH HAZEL-GLYCERIN EX PADS: 1.0000 "application " | MEDICATED_PAD | CUTANEOUS | Status: DC | PRN

## 2018-12-28 MED ORDER — MORPHINE SULFATE (PF) 0.5 MG/ML IJ SOLN
INTRAMUSCULAR | Status: DC | PRN
  Administered 2018-12-28: .15 mg via INTRATHECAL

## 2018-12-28 MED ORDER — SODIUM CHLORIDE 0.9 % IV SOLN
2.0000 g | INTRAVENOUS | Status: AC
  Administered 2018-12-28: 2 g via INTRAVENOUS

## 2018-12-28 MED ORDER — ACETAMINOPHEN 500 MG PO TABS
1000.0000 mg | ORAL_TABLET | ORAL | Status: AC
  Administered 2018-12-28: 1000 mg via ORAL

## 2018-12-28 MED ORDER — HYDROMORPHONE HCL 1 MG/ML IJ SOLN: 1.0000 mg | INTRAMUSCULAR | Status: DC | PRN

## 2018-12-28 MED ORDER — FENTANYL CITRATE (PF) 100 MCG/2ML IJ SOLN
50.0000 ug | Freq: Once | INTRAMUSCULAR | Status: AC
  Administered 2018-12-28: 12:00:00 50 ug via INTRAVENOUS

## 2018-12-28 MED ORDER — ACETAMINOPHEN 500 MG PO TABS
ORAL_TABLET | ORAL | Status: AC
  Filled 2018-12-28: qty 2

## 2018-12-28 MED ORDER — SENNOSIDES-DOCUSATE SODIUM 8.6-50 MG PO TABS
2.0000 | ORAL_TABLET | ORAL | Status: DC
  Administered 2018-12-29 (×2): 2 via ORAL
  Filled 2018-12-28 (×2): qty 2

## 2018-12-28 MED ORDER — DIPHENHYDRAMINE HCL 25 MG PO CAPS
25.0000 mg | ORAL_CAPSULE | Freq: Four times a day (QID) | ORAL | Status: DC | PRN
  Administered 2018-12-28 – 2018-12-29 (×2): 25 mg via ORAL
  Filled 2018-12-28: qty 1

## 2018-12-28 MED ORDER — SIMETHICONE 80 MG PO CHEW: 80.0000 mg | CHEWABLE_TABLET | ORAL | Status: DC | PRN

## 2018-12-28 MED ORDER — DIBUCAINE (PERIANAL) 1 % EX OINT: 1.0000 "application " | TOPICAL_OINTMENT | CUTANEOUS | Status: DC | PRN

## 2018-12-28 MED ORDER — LACTATED RINGERS IV SOLN
INTRAVENOUS | Status: DC
  Administered 2018-12-28 – 2018-12-29 (×2): via INTRAVENOUS

## 2018-12-28 MED ORDER — SODIUM CHLORIDE 0.9 % IV SOLN
INTRAVENOUS | Status: DC | PRN
  Administered 2018-12-28: 10:00:00 via INTRAVENOUS

## 2018-12-28 MED ORDER — SOD CITRATE-CITRIC ACID 500-334 MG/5ML PO SOLN
30.0000 mL | ORAL | Status: AC
  Administered 2018-12-28: 30 mL via ORAL

## 2018-12-28 MED ORDER — TRANEXAMIC ACID-NACL 1000-0.7 MG/100ML-% IV SOLN
1000.0000 mg | INTRAVENOUS | Status: AC
  Administered 2018-12-28: 10:00:00 1000 mg via INTRAVENOUS

## 2018-12-28 MED ORDER — ENOXAPARIN SODIUM 60 MG/0.6ML ~~LOC~~ SOLN
0.5000 mg/kg | SUBCUTANEOUS | Status: DC
  Administered 2018-12-29 – 2018-12-30 (×2): 60 mg via SUBCUTANEOUS
  Filled 2018-12-28 (×2): qty 0.6

## 2018-12-28 MED ORDER — ONDANSETRON HCL 4 MG/2ML IJ SOLN
INTRAMUSCULAR | Status: DC | PRN
  Administered 2018-12-28: 4 mg via INTRAVENOUS

## 2018-12-28 MED ORDER — PROMETHAZINE HCL 25 MG/ML IJ SOLN
INTRAMUSCULAR | Status: AC
  Filled 2018-12-28: qty 1

## 2018-12-28 MED ORDER — TRAMADOL HCL 50 MG PO TABS: 50.0000 mg | ORAL_TABLET | Freq: Four times a day (QID) | ORAL | Status: DC | PRN

## 2018-12-28 MED ORDER — ACETAMINOPHEN 10 MG/ML IV SOLN: 1000.0000 mg | Freq: Once | INTRAVENOUS | Status: DC | PRN

## 2018-12-28 MED ORDER — MAGNESIUM HYDROXIDE 400 MG/5ML PO SUSP: 30.0000 mL | ORAL | Status: DC | PRN

## 2018-12-28 MED ORDER — SODIUM CHLORIDE 0.9 % IV SOLN
INTRAVENOUS | Status: DC | PRN
  Administered 2018-12-28: 40 [IU] via INTRAVENOUS

## 2018-12-28 SURGICAL SUPPLY — 35 items
BENZOIN TINCTURE PRP APPL 2/3 (GAUZE/BANDAGES/DRESSINGS) ×2 IMPLANT
CHLORAPREP W/TINT 26ML (MISCELLANEOUS) ×2 IMPLANT
CLAMP CORD UMBIL (MISCELLANEOUS) IMPLANT
CLOSURE STERI STRIP 1/2 X4 (GAUZE/BANDAGES/DRESSINGS) ×2 IMPLANT
CLOTH BEACON ORANGE TIMEOUT ST (SAFETY) ×2 IMPLANT
DRSG OPSITE POSTOP 4X10 (GAUZE/BANDAGES/DRESSINGS) ×2 IMPLANT
ELECT REM PT RETURN 9FT ADLT (ELECTROSURGICAL) ×2
ELECTRODE REM PT RTRN 9FT ADLT (ELECTROSURGICAL) ×1 IMPLANT
EXTRACTOR VACUUM M CUP 4 TUBE (SUCTIONS) IMPLANT
GLOVE BIOGEL PI IND STRL 7.0 (GLOVE) ×3 IMPLANT
GLOVE BIOGEL PI INDICATOR 7.0 (GLOVE) ×3
GLOVE ECLIPSE 7.0 STRL STRAW (GLOVE) ×2 IMPLANT
GOWN STRL REUS W/TWL LRG LVL3 (GOWN DISPOSABLE) ×4 IMPLANT
HOVERMATT SINGLE USE (MISCELLANEOUS) ×2 IMPLANT
KIT ABG SYR 3ML LUER SLIP (SYRINGE) IMPLANT
KIT PREVENA INCISION MGT20CM45 (CANNISTER) ×2 IMPLANT
NEEDLE HYPO 22GX1.5 SAFETY (NEEDLE) ×2 IMPLANT
NEEDLE HYPO 25X5/8 SAFETYGLIDE (NEEDLE) ×2 IMPLANT
NS IRRIG 1000ML POUR BTL (IV SOLUTION) ×2 IMPLANT
PACK C SECTION WH (CUSTOM PROCEDURE TRAY) ×2 IMPLANT
PAD ABD 7.5X8 STRL (GAUZE/BANDAGES/DRESSINGS) ×2 IMPLANT
PAD OB MATERNITY 4.3X12.25 (PERSONAL CARE ITEMS) ×2 IMPLANT
PENCIL SMOKE EVAC W/HOLSTER (ELECTROSURGICAL) ×2 IMPLANT
RETRACTOR TRAXI PANNICULUS (MISCELLANEOUS) ×1 IMPLANT
RTRCTR C-SECT PINK 25CM LRG (MISCELLANEOUS) IMPLANT
SUT PDS AB 0 CTX 36 PDP370T (SUTURE) IMPLANT
SUT PLAIN 2 0 XLH (SUTURE) IMPLANT
SUT VIC AB 0 CTX 36 (SUTURE) ×3
SUT VIC AB 0 CTX36XBRD ANBCTRL (SUTURE) ×3 IMPLANT
SUT VIC AB 4-0 KS 27 (SUTURE) ×2 IMPLANT
SYR CONTROL 10ML LL (SYRINGE) ×2 IMPLANT
TOWEL OR 17X24 6PK STRL BLUE (TOWEL DISPOSABLE) ×2 IMPLANT
TRAXI PANNICULUS RETRACTOR (MISCELLANEOUS) ×1
TRAY FOLEY W/BAG SLVR 14FR LF (SET/KITS/TRAYS/PACK) ×2 IMPLANT
WATER STERILE IRR 1000ML POUR (IV SOLUTION) ×2 IMPLANT

## 2018-12-28 NOTE — Anesthesia Preprocedure Evaluation (Signed)
Anesthesia Evaluation  Patient identified by MRN, date of birth, ID band Patient awake    Reviewed: Allergy & Precautions, NPO status , Patient's Chart, lab work & pertinent test results  Airway Mallampati: III  TM Distance: >3 FB Neck ROM: Full    Dental no notable dental hx.    Pulmonary neg pulmonary ROS, former smoker,    Pulmonary exam normal breath sounds clear to auscultation       Cardiovascular negative cardio ROS Normal cardiovascular exam Rhythm:Regular Rate:Normal     Neuro/Psych negative neurological ROS  negative psych ROS   GI/Hepatic negative GI ROS, Neg liver ROS,   Endo/Other  diabetes, Gestational, Oral Hypoglycemic AgentsMorbid obesity  Renal/GU negative Renal ROS  negative genitourinary   Musculoskeletal negative musculoskeletal ROS (+)   Abdominal   Peds  Hematology  (+) Blood dyscrasia (Hgb 11.7), anemia ,   Anesthesia Other Findings   Reproductive/Obstetrics                             Anesthesia Physical Anesthesia Plan  ASA: III  Anesthesia Plan: Spinal   Post-op Pain Management:    Induction:   PONV Risk Score and Plan: Treatment may vary due to age or medical condition  Airway Management Planned: Natural Airway  Additional Equipment:   Intra-op Plan:   Post-operative Plan:   Informed Consent: I have reviewed the patients History and Physical, chart, labs and discussed the procedure including the risks, benefits and alternatives for the proposed anesthesia with the patient or authorized representative who has indicated his/her understanding and acceptance.     Dental advisory given  Plan Discussed with: CRNA  Anesthesia Plan Comments:         Anesthesia Quick Evaluation

## 2018-12-28 NOTE — Op Note (Signed)
Ashley Costa PROCEDURE DATE: 12/28/2018  PREOPERATIVE DIAGNOSES: Intrauterine pregnancy at [redacted]w[redacted]d weeks gestation; previous cesarean delivery and declines trial of labor; gestational diabetes; morbid obesity.  POSTOPERATIVE DIAGNOSES: The same  PROCEDURE: Repeat Low Transverse Cesarean Section  SURGEON:  Dr. Verita Schneiders  ASSISTANT:  Dr. Phill Myron  ANESTHESIOLOGY TEAM: Anesthesiologist: Freddrick March, MD CRNA: Asher Muir, CRNA  INDICATIONS: Ashley Costa is a 33 y.o. G2P1001 at [redacted]w[redacted]d here for repeat cesarean section secondary to the indications listed under preoperative diagnoses; please see preoperative note for further details.  The risks of cesarean section were discussed with the patient including but were not limited to: bleeding which may require transfusion or reoperation; infection which may require antibiotics; injury to bowel, bladder, ureters or other surrounding organs; injury to the fetus; need for additional procedures including hysterectomy in the event of a life-threatening hemorrhage; placental abnormalities wth subsequent pregnancies, incisional problems, thromboembolic phenomenon and other postoperative/anesthesia complications.   The patient concurred with the proposed plan, giving informed written consent for the procedure.    FINDINGS:  Viable female infant in cephalic presentation.  Apgars 8 and 9.  Clear amniotic fluid.  Intact placenta, three vessel cord.  Normal uterus, fallopian tubes and ovaries bilaterally. Dense adhesions of the omentum to anterior abdominal wall, this made entry to the uterus difficulty. Also the fascia and other tissue scarred densely, there was no pliability at all, needed vacuum assistance to delivery fetal head (was on for less than 5 seconds).  Overall, the procedure was technically more difficult than expected due to exposure problems.   ANESTHESIA: Spinal INTRAVENOUS FLUIDS: 2500 ml   ESTIMATED BLOOD LOSS: 60 ml as  per Triton (I estimated about 300 ml) URINE OUTPUT:  100 ml SPECIMENS: Placenta sent to L&D COMPLICATIONS: None immediate  PROCEDURE IN DETAIL:  The patient preoperatively received intravenous antibiotics and had sequential compression devices applied to her lower extremities.  She was then taken to the operating room where spinal anesthesia was administered and was found to be adequate. She was then placed in a dorsal supine position with a leftward tilt, and prepped and draped in a sterile manner.  A foley catheter was placed into her bladder and attached to constant gravity.  After an adequate timeout was performed, a Pfannenstiel skin incision was made with scalpel on her preexisting scar and carried through to the underlying layer of fascia. The fascia was incised in the midline, and this incision was extended bilaterally using the Mayo scissors.  Kocher clamps were applied to the superior aspect of the fascial incision and the underlying rectus muscles were dissected off sharply.  The rectus muscles were separated in the midline and the peritoneum was entered sharply. The aforementioned dense omental adhesions were encountered and care was taken to lyse them using a combination of suture ligation with 0 Vicryl and electrocautery.  In order to obtain adequate exposure for the delivery, the rectus muscles were transected horizontally about 1.5 cm from medial sides. The Alexis self-retaining retractor was then introduced into the abdominal cavity.  Attention was turned to the lower uterine segment where a low transverse hysterotomy was made with a scalpel and extended bilaterally bluntly.  The infant was successfully delivered with some difficulty and assistance with a vacuum, the cord was clamped and cut after one minute, and the infant was handed over to the awaiting neonatology team. Uterine massage was then administered, and the placenta delivered intact with a three-vessel cord. The uterus was then  cleared  of clots and debris.  The hysterotomy was closed with 0 Vicryl in a running locked fashion, and an imbricating layer was also placed with 0 Vicryl.  The pelvis was cleared of all clot and debris. Hemostasis was confirmed on all surfaces.  The retractor was removed.  The peritoneum and the rectus muscles were reapproximated using a 0 Vicryl interrupted stitch. The fascia was then closed using 0 PDS in a running fashion.  The subcutaneous layer was irrigated, reapproximated with 2-0 plain gut interrupted stitches, and the skin was closed with a 4-0 Vicryl subcuticular stitch. The patient tolerated the procedure well.  After the skin was closed, a Prevena disposable negative pressure wound therapy device was placed over the incision.  The suction was activated at a pressure of 80 mmHg.  The adhesive was affixed well and there were no leaks noted. Sponge, instrument and needle counts were correct x 3.  She was taken to the recovery room in stable condition.    Verita Schneiders, MD, [place] for [employer], [place]

## 2018-12-28 NOTE — Transfer of Care (Signed)
Immediate Anesthesia Transfer of Care Note  Patient: Ashley Costa  Procedure(s) Performed: CESAREAN SECTION (N/A )  Patient Location: PACU  Anesthesia Type:Spinal  Level of Consciousness: awake  Airway & Oxygen Therapy: Patient Spontanous Breathing  Post-op Assessment: Report given to RN  Post vital signs: Reviewed and stable  Last Vitals:  Vitals Value Taken Time  BP    Temp    Pulse 67 12/28/18 1125  Resp 18 12/28/18 1125  SpO2 99 % 12/28/18 1125  Vitals shown include unvalidated device data.  Last Pain:  Vitals:   12/28/18 0746  TempSrc: Oral         Complications: No apparent anesthesia complications

## 2018-12-28 NOTE — Discharge Summary (Addendum)
OB Discharge Summary     Patient Name: Ashley Costa DOB: 27-Apr-1986 MRN: [id number]  Date of admission: 12/28/2018 Delivering MD: Verita Schneiders A   Date of discharge: 12/30/2018  Admitting diagnosis: RCS Intrauterine pregnancy: [redacted]w[redacted]d    Secondary diagnosis:  Active Problems:   Maternal morbid obesity, antepartum ([place]   Status post primary low transverse cesarean section   Supervision of high-risk pregnancy   History of primary cesarean section   Gestational diabetes mellitus (GDM) in third trimester controlled on oral hypoglycemic drug   Status post repeat low transverse cesarean section  Additional problems: None     Discharge diagnosis: Term Pregnancy Delivered                                                                                                Post partum procedures:none  Augmentation: N/A  Complications: None  Hospital course:  Sceduled C/S   33y.o. yo G2P1001 at 33w0das admitted to the hospital 12/28/2018 for scheduled cesarean section with the following indication:Elective Repeat.  Membrane Rupture Time/Date: 10:24 AM ,12/28/2018   Patient delivered a Viable infant.12/28/2018  Details of operation can be found in separate operative note.  Pateint had an uncomplicated postpartum course.  She is ambulating, tolerating a regular diet, passing flatus, and urinating well. Patient is discharged home in stable condition on  12/30/18         Physical exam  Vitals:   12/29/18 0910 12/29/18 1335 12/29/18 2116 12/30/18 0538  BP: (!) 134/56 (!) 126/51 (!) 141/77 134/64  Pulse: 63 (!) 57 74 67  Resp:  _0 Temp: 98.4 F (36.9 C) 98.5 F (36.9 C) 98 F (36.7 C) 97.8 F (36.6 C)  TempSrc: Oral Oral Oral Oral  SpO2: 99% 99% 100%   Weight:      Height:       General: alert, cooperative and no distress Lochia: appropriate Uterine Fundus: firm Incision: Dressing is clean, dry, and intact DVT Evaluation: No significant calf/ankle edema. Labs: Lab  Results  Component Value Date   WBC 8.0 12/28/2018   HGB 11.7 (L) 12/28/2018   HCT 35.1 (L) 12/28/2018   MCV 82.4 12/28/2018   PLT 300 12/28/2018   CMP Latest Ref Rng & Units 12/28/2018  Glucose 70 - 99 mg/dL 77  BUN 6 - 20 mg/dL 7  Creatinine 0.44 - 1.00 mg/dL 1.05(H)  Sodium 135 - 145 mmol/L 137  Potassium 3.5 - 5.1 mmol/L 3.7  Chloride 98 - 111 mmol/L 110  CO2 22 - 32 mmol/L 17(L)  Calcium 8.9 - 10.3 mg/dL 8.7(L)  Total Protein 6.5 - 8.1 g/dL 6.2(L)  Total Bilirubin 0.3 - 1.2 mg/dL 0.6  Alkaline Phos 38 - 126 U/L 117  AST 15 - 41 U/L 16  ALT 0 - 44 U/L 12    Discharge instruction: per After Visit Summary and "Baby and Me Booklet".  After visit meds:  Allergies as of 12/30/2018   No Known Allergies     Medication List    STOP taking these medications   Accu-Chek FastClix Lancets Misc  acetaminophen 500 MG tablet Commonly known as: TYLENOL   diphenhydrAMINE 25 MG tablet Commonly known as: BENADRYL   glucose blood test strip Commonly known as: Accu-Chek Guide   metFORMIN 500 MG tablet Commonly known as: GLUCOPHAGE   Prenate Mini 29-0.6-0.4-350 MG Caps   Vitafol Ultra 29-0.6-0.4-200 MG Caps     TAKE these medications   ibuprofen 800 MG tablet Commonly known as: ADVIL Take 1 tablet (800 mg total) by mouth every 6 (six) hours.   oxyCODONE-acetaminophen 5-325 MG tablet Commonly known as: PERCOCET/ROXICET Take 1-2 tablets by mouth every 4 (four) hours as needed for moderate pain.   prenatal multivitamin Tabs tablet Take 1 tablet by mouth daily at 12 noon.       Diet: routine diet  Activity: Advance as tolerated. Pelvic rest for 6 weeks.   Outpatient follow up:4 weeks Follow up Appt: Future Appointments  Date Time Provider Lynd  01/06/2019  8:45 AM Chancy Milroy, MD [facility] None  01/14/2019 10:15 AM Chancy Milroy, MD [facility] None  01/27/2019 10:00 AM Emily Filbert, MD [facility] None  02/10/2019  9:00 AM [facility] None    Follow up Visit:No follow-ups on file.  Postpartum contraception: Nexplanon  Newborn Data: Live born female  Birth Weight:  3015g, 6lb 10.3oz APGAR: 33, 9  Newborn Delivery   Birth date/time: 12/28/2018 10:27:00 Delivery type: C-Section, Vacuum Assisted Trial of labor: No C-section categorization: Repeat      Baby Feeding: Breast Disposition:home with mother   12/30/2018 Gerlene Fee, DO  I personally saw and evaluated the patient, performing the key elements of the service. I developed and verified the management plan that is described in the resident's/student's note, and I agree with the content with my edits above. VSS, HRR&R, Resp unlabored, Legs neg.  Nigel Berthold, CNM 01/08/2019 11:14 PM

## 2018-12-28 NOTE — Anesthesia Postprocedure Evaluation (Signed)
Anesthesia Post Note  Patient: Dance movement psychotherapist  Procedure(s) Performed: CESAREAN SECTION (N/A )     Patient location during evaluation: PACU Anesthesia Type: Spinal Level of consciousness: oriented and awake and alert Pain management: pain level controlled Vital Signs Assessment: post-procedure vital signs reviewed and stable Respiratory status: spontaneous breathing, respiratory function stable and patient connected to nasal cannula oxygen Cardiovascular status: blood pressure returned to baseline and stable Postop Assessment: no headache, no backache and no apparent nausea or vomiting Anesthetic complications: no    Last Vitals:  Vitals:   12/28/18 1320 12/28/18 1430  BP: 135/87 125/63  Pulse: 68 66  Resp: 17 18  Temp: 36.8 C 36.7 C  SpO2: 99% 98%    Last Pain:  Vitals:   12/28/18 1430  TempSrc: Oral  PainSc: 1    Pain Goal: Patients Stated Pain Goal: 4 (12/28/18 1430)                 Tina Gruner L Tylasia Fletchall

## 2018-12-28 NOTE — H&P (Signed)
Obstetric Preoperative History and Physical  Ashley Costa is a 33 y.o. G2P1001 with IUP at [redacted]w[redacted]d presenting for scheduled repeat cesarean section.  Reports good fetal movement, no bleeding, no contractions, no leaking of fluid.  No acute preoperative concerns.    Cesarean Section Indication: Previous cesarean section, declines trial of labor  Prenatal Course Source of Care: [facility] with onset of care at 15 weeks Pregnancy complications or risks: Patient Active Problem List   Diagnosis Date Noted  . Status post cesarean delivery 12/28/2018  . Group B Streptococcus carrier, +RV culture, currently pregnant 12/15/2018  . HSIL (high grade squamous intraepithelial lesion) on Pap smear of cervix 10/23/2018  . Gestational diabetes mellitus (GDM) in third trimester controlled on oral hypoglycemic drug 10/23/2018  . History of primary cesarean section 10/09/2018  . Supervision of high-risk pregnancy 06/13/2018  . Status post primary low transverse cesarean section 11/08/2016  . Maternal morbid obesity, antepartum ([place]) 09/04/2016  . Pregnancy with history of infertility 04/17/2016  . Tobacco abuse 08/06/2012  . Obesity, Class III, BMI 40-49.9 (morbid obesity) ([place]) 08/06/2012    Nursing Staff Provider  Office Location  [facility] Dating  LMP  Language   English Anatomy US  Normal  Flu Vaccine   06/13/2018 Genetic Screen  NIPS: low risk female  AFP: negative  TDaP vaccine   10/09/2018 Hgb A1C or  GTT Third trimester: elevated  Rhogam  n/a   LAB RESULTS   Feeding Plan  Breastfeed Blood Type O/Positive/-- (01/10 1107)   Contraception  PP Nexplanon Antibody Negative (01/10 1107)  Circumcision  yes Rubella 1.29 (01/10 1107)  Pediatrician   [place] Dura RPR Non Reactive (05/07 0931)   Support Person  FOB/Parents HBsAg Negative (01/10 1107)   Prenatal Classes  no HIV Non Reactive (05/07 0931)    GBS  Positive    Pap  HSIL and HPV, needs PP colpo     Past Medical History:  Diagnosis Date  .  Endometriosis 2015  . Gestational diabetes   . Pap smear abnormality of cervix   . Smoker     Past Surgical History:  Procedure Laterality Date  . CESAREAN SECTION N/A 11/05/2016   Procedure: CESAREAN SECTION;  Surgeon: Aletha Halim, MD;  Location: [facility];  Service: Obstetrics;  Laterality: N/A;  . TONSILECTOMY/ADENOIDECTOMY WITH MYRINGOTOMY    . Wisdom teeth abstracted      OB History  Gravida Para Term Preterm AB Living  2 1 1     1   SAB TAB Ectopic Multiple Live Births        0 1    # Outcome Date GA Lbr Len/2nd Weight Sex Delivery Anes PTL Lv  2 Current           1 Term 11/05/16 [redacted]w[redacted]d  2840 g F CS-LTranv EPI  LIV     Birth Comments: post term, slightly pale, term infant with light meconium in creases    Social History   Socioeconomic History  . Marital status: Married    Spouse name: Not on file  . Number of children: 1  . Years of education: Not on file  . Highest education level: Not on file  Occupational History  . Not on file  Social Needs  . Financial resource strain: Not hard at all  . Food insecurity    Worry: Never true    Inability: Never true  . Transportation needs    Medical: No    Non-medical: Not on file  Tobacco Use  .  Smoking status: Former Smoker    Packs/day: 0.25    Types: Cigarettes  . Smokeless tobacco: Never Used  Substance and Sexual Activity  . Alcohol use: Not Currently    Comment: occasionally  . Drug use: No  . Sexual activity: Yes    Birth control/protection: None  Lifestyle  . Physical activity    Days per week: Not on file    Minutes per session: Not on file  . Stress: Only a little  Relationships  . Social Herbalist on phone: Not on file    Gets together: Not on file    Attends religious service: Not on file    Active member of club or organization: Not on file    Attends meetings of clubs or organizations: Not on file    Relationship status: Not on file  Other Topics Concern  . Not on  file  Social History Narrative  . Not on file    Family History  Problem Relation Age of Onset  . Diabetes Father   . Hypertension Father   . Diabetes Mother   . Hypertension Mother     Medications Prior to Admission  Medication Sig Dispense Refill Last Dose  . acetaminophen (TYLENOL) 500 MG tablet Take 1,000 mg by mouth every 6 (six) hours as needed for moderate pain.   Past Week at Unknown time  . diphenhydrAMINE (BENADRYL) 25 MG tablet Take 50 mg by mouth every 6 (six) hours as needed for allergies.    Past Month at Unknown time  . Prenat-Fe Poly-Methfol-FA-DHA (VITAFOL ULTRA) 29-0.6-0.4-200 MG CAPS Take 1 capsule by mouth at bedtime.    12/27/2018 at Unknown time  . Accu-Chek FastClix Lancets MISC 1 each by Percutaneous route 4 (four) times daily. 100 each 5   . glucose blood (ACCU-CHEK GUIDE) test strip Use 1 strip to check blood sugar 4 times daily. 100 each 5   . metFORMIN (GLUCOPHAGE) 500 MG tablet Take 1 tablet (500 mg total) by mouth at bedtime. (Patient not taking: Reported on 12/22/2018) 60 tablet 5 Not Taking at Unknown time  . Prenat w/o A-FeCbn-Meth-FA-DHA (PRENATE MINI) 29-0.6-0.4-350 MG CAPS Take 1 capsule by mouth daily before breakfast. (Patient not taking: Reported on 12/22/2018) 90 capsule 4 Not Taking at Unknown time    No Known Allergies  Review of Systems: Pertinent items noted in HPI and remainder of comprehensive ROS otherwise negative.  Physical Exam: BP 140/83 (BP Location: Right Arm)   Pulse 80   Temp 98.8 F (37.1 C) (Oral)   Resp 19   Ht 5\' 4"  (1.626 m)   Wt 120.7 kg   LMP 03/30/2018 (Exact Date)   SpO2 99%   BMI 45.66 kg/m  FHR by Doppler: 138 bpm CONSTITUTIONAL: Well-developed, well-nourished female in no acute distress.  HENT:  Normocephalic, atraumatic, External right and left ear normal. Oropharynx is clear and moist EYES: Conjunctivae and EOM are normal. Pupils are equal, round, and reactive to light. No scleral icterus.  NECK: Normal  range of motion, supple, no masses SKIN: Skin is warm and dry. No rash noted. Not diaphoretic. No erythema. No pallor. [place]: Alert and oriented to person, place, and time. Normal reflexes, muscle tone coordination. No cranial nerve deficit noted. PSYCHIATRIC: Normal mood and affect. Normal behavior. Normal judgment and thought content. CARDIOVASCULAR: Normal heart rate noted, regular rhythm RESPIRATORY: Effort and breath sounds normal, no problems with respiration noted ABDOMEN: Soft, nontender, nondistended, gravid. Well-healed Pfannenstiel incision. PELVIC: Deferred MUSCULOSKELETAL: Normal  range of motion. No edema and no tenderness. 2+ distal pulses.   Pertinent Labs/Studies:   Results for orders placed or performed during the hospital encounter of 12/28/18 (from the past 72 hour(s))  Type and screen Frank     Status: None   Collection Time: 12/28/18  7:45 AM  Result Value Ref Range   ABO/RH(D) O POS    Antibody Screen NEG    Sample Expiration      12/31/2018,2359 Performed at [facility], [place] [address], [place], [place] [address]   CBC     Status: Abnormal   Collection Time: 12/28/18  7:58 AM  Result Value Ref Range   WBC 8.0 4.0 - 10.5 K/uL   RBC 4.26 3.87 - 5.11 MIL/uL   Hemoglobin 11.7 (L) 12.0 - 15.0 g/dL   HCT 35.1 (L) 36.0 - 46.0 %   MCV 82.4 80.0 - 100.0 fL   MCH 27.5 26.0 - 34.0 pg   MCHC 33.3 30.0 - 36.0 g/dL   RDW 15.6 (H) 11.5 - 15.5 %   Platelets 300 150 - 400 K/uL   nRBC 0.0 0.0 - 0.2 %    Comment: Performed at [facility], [place] [address], [place], [place] [address]  Comprehensive metabolic panel     Status: Abnormal   Collection Time: 12/28/18  7:58 AM  Result Value Ref Range   Sodium 137 135 - 145 mmol/L   Potassium 3.7 3.5 - 5.1 mmol/L   Chloride 110 98 - 111 mmol/L   CO2 17 (L) 22 - 32 mmol/L   Glucose, Bld 77 70 - 99 mg/dL   BUN 7 6 - 20 mg/dL   Creatinine, Ser 1.05 (H) 0.44 - 1.00 mg/dL   Calcium 8.7 (L)  8.9 - 10.3 mg/dL   Total Protein 6.2 (L) 6.5 - 8.1 g/dL   Albumin 2.7 (L) 3.5 - 5.0 g/dL   AST 16 15 - 41 U/L   ALT 12 0 - 44 U/L   Alkaline Phosphatase 117 38 - 126 U/L   Total Bilirubin 0.6 0.3 - 1.2 mg/dL   GFR calc non Af Amer >60 >60 mL/min   GFR calc Af Amer >60 >60 mL/min   Anion gap 10 5 - 15    Comment: Performed at [facility], [place] [address] [place],  [address]  Glucose, capillary     Status: None   Collection Time: 12/28/18  8:24 AM  Result Value Ref Range   Glucose-Capillary 71 70 - 99 mg/dL    Assessment and Plan: Ashley Costa is a 33 y.o. G2P1001 at [redacted]w[redacted]d with A2GDM and morbid obesity being admitted for scheduled repeat cesarean section. The risks of cesarean section discussed with the patient included but were not limited to: bleeding which may require transfusion or reoperation; infection which may require antibiotics; injury to bowel, bladder, ureters or other surrounding organs; injury to the fetus; need for additional procedures including hysterectomy in the event of a life-threatening hemorrhage; placental abnormalities wth subsequent pregnancies, incisional problems, thromboembolic phenomenon and other postoperative/anesthesia complications. Patient told she will be getting Prevena for wound care, will get instructions on how to use prior to discharge. The patient concurred with the proposed plan, giving informed written consent for the procedure. Patient has been NPO since last night she will remain NPO for procedure. Anesthesia and OR aware. Preoperative prophylactic antibiotics and SCDs ordered on call to the OR.   To OR when ready.  Verita Schneiders, MD, [place] for [employer], [place]

## 2018-12-28 NOTE — Anesthesia Procedure Notes (Signed)
Spinal  Patient location during procedure: OR Start time: 12/28/2018 9:40 AM End time: 12/28/2018 9:50 AM Staffing Anesthesiologist: Freddrick March, MD Performed: anesthesiologist  Preanesthetic Checklist Completed: patient identified, surgical consent, pre-op evaluation, timeout performed, IV checked, risks and benefits discussed and monitors and equipment checked Spinal Block Patient position: sitting Prep: site prepped and draped and DuraPrep Patient monitoring: cardiac monitor, continuous pulse ox and blood pressure Approach: midline Location: L3-4 Injection technique: single-shot Needle Needle type: Tuohy and Pencan  Needle gauge: 25 G Needle length: 9 cm Assessment Sensory level: T6 Additional Notes Functioning IV was confirmed and monitors were applied. Sterile prep and drape, including hand hygiene and sterile gloves were used. The patient was positioned and the spine was prepped. The skin was anesthetized with lidocaine.  Free flow of clear CSF was obtained prior to injecting local anesthetic into the CSF.  The spinal needle aspirated freely following injection.  The needle was carefully withdrawn.  The patient tolerated the procedure well.

## 2018-12-29 LAB — COMPREHENSIVE METABOLIC PANEL
ALT: 13 U/L (ref 0–44)
AST: 24 U/L (ref 15–41)
Albumin: 2.4 g/dL — ABNORMAL LOW (ref 3.5–5.0)
Alkaline Phosphatase: 99 U/L (ref 38–126)
Anion gap: 5 (ref 5–15)
BUN: 12 mg/dL (ref 6–20)
CO2: 22 mmol/L (ref 22–32)
Calcium: 8.8 mg/dL — ABNORMAL LOW (ref 8.9–10.3)
Chloride: 107 mmol/L (ref 98–111)
Creatinine, Ser: 1.59 mg/dL — ABNORMAL HIGH (ref 0.44–1.00)
GFR calc Af Amer: 49 mL/min — ABNORMAL LOW (ref >60)
GFR calc non Af Amer: 42 mL/min — ABNORMAL LOW (ref >60)
Glucose, Bld: 94 mg/dL (ref 70–99)
Potassium: 4.8 mmol/L (ref 3.5–5.1)
Sodium: 134 mmol/L — ABNORMAL LOW (ref 135–145)
Total Bilirubin: 0.4 mg/dL (ref 0.3–1.2)
Total Protein: 5.4 g/dL — ABNORMAL LOW (ref 6.5–8.1)

## 2018-12-29 LAB — CBC
HCT: 29.8 % — ABNORMAL LOW (ref 36.0–46.0)
Hemoglobin: 9.8 g/dL — ABNORMAL LOW (ref 12.0–15.0)
MCH: 27.9 pg (ref 26.0–34.0)
MCHC: 32.9 g/dL (ref 30.0–36.0)
MCV: 84.9 fL (ref 80.0–100.0)
Platelets: 256 10*3/uL (ref 150–400)
RBC: 3.51 MIL/uL — ABNORMAL LOW (ref 3.87–5.11)
RDW: 15.9 % — ABNORMAL HIGH (ref 11.5–15.5)
WBC: 9.3 10*3/uL (ref 4.0–10.5)
nRBC: 0 % (ref 0.0–0.2)

## 2018-12-29 LAB — BASIC METABOLIC PANEL
Anion gap: 8 (ref 5–15)
BUN: 10 mg/dL (ref 6–20)
CO2: 21 mmol/L — ABNORMAL LOW (ref 22–32)
Calcium: 8.7 mg/dL — ABNORMAL LOW (ref 8.9–10.3)
Chloride: 106 mmol/L (ref 98–111)
Creatinine, Ser: 1.41 mg/dL — ABNORMAL HIGH (ref 0.44–1.00)
GFR calc Af Amer: 57 mL/min — ABNORMAL LOW (ref >60)
GFR calc non Af Amer: 49 mL/min — ABNORMAL LOW (ref >60)
Glucose, Bld: 72 mg/dL (ref 70–99)
Potassium: 4.7 mmol/L (ref 3.5–5.1)
Sodium: 135 mmol/L (ref 135–145)

## 2018-12-29 LAB — GLUCOSE, CAPILLARY: Glucose-Capillary: 87 mg/dL (ref 70–99)

## 2018-12-29 LAB — ABO/RH: ABO/RH(D): O POS

## 2018-12-29 MED ORDER — ETONOGESTREL 68 MG ~~LOC~~ IMPL
68.0000 mg | DRUG_IMPLANT | Freq: Once | SUBCUTANEOUS | Status: AC
  Administered 2018-12-30: 68 mg via SUBCUTANEOUS
  Filled 2018-12-29: qty 1

## 2018-12-29 MED ORDER — LIDOCAINE HCL 1 % IJ SOLN
0.0000 mL | Freq: Once | INTRAMUSCULAR | Status: AC | PRN
  Administered 2018-12-30: 20 mL via INTRADERMAL
  Filled 2018-12-29: qty 20

## 2018-12-29 NOTE — Progress Notes (Signed)
Subjective: Postpartum Day 1: Cesarean Delivery Patient reports tolerating PO, + flatus and no problems voiding.    Objective: Vital signs in last 24 hours: Temp:  [97.5 F (36.4 C)-98.5 F (36.9 C)] 98.4 F (36.9 C) (07/27 0910) Pulse Rate:  [55-115] 63 (07/27 0910) Resp:  [8-35] 16 (07/27 0514) BP: (117-143)/(56-115) 134/56 (07/27 0910) SpO2:  [96 %-100 %] 99 % (07/27 0910)  Physical Exam:  General: alert, cooperative and no distress Lochia: appropriate Uterine Fundus: firm Incision: healing well, no significant drainage, no dehiscence, no significant erythema; Prevena in place and running DVT Evaluation: No evidence of DVT seen on physical exam. Negative Homan's sign. No cords or calf tenderness. No significant calf/ankle edema.  Recent Labs    12/28/18 0758 12/29/18 0349  HGB 11.7* 9.8*  HCT 35.1* 29.8*    Assessment/Plan: Status post Cesarean section. Postoperative course complicated by decreased UO and elevated Cr earlier, output improving  Acute blood loss anemia- asymptomatic A2GDM- glucose stable Strict I/O Repeat BMP at noon Continue current care Planning inpt [place], CNM 12/29/2018, 10:51 AM

## 2018-12-29 NOTE — Lactation Note (Signed)
This note was copied from a baby's chart. Lactation Consultation Note Baby 6 hrs old.  Mom states baby is BF well.  Mom had baby in football position feeding. Mom has large pendulous breast w/Large everted nipples. Hand expression w/easy flow of colostrum. Mom has 33 yr old that stated wouldn't latch so mom pumped and bottle fed for 2 months then formula fed. Newborn behavior, feeding habits, STS, I&O, breast massage, supply and demand discussed. Answered questions mom had. Mom encouraged to feed baby 8-12 times/24 hours and with feeding cues. Mom encouraged to waken baby for feeds if hasn't cued in 3 hrs. Encouraged mom to call for assistance or further questions. Lactation brochure given.  Patient Name: Ashley Costa [id number] Date: 12/29/2018 Reason for consult: Initial assessment;Term   Maternal Data Has patient been taught Hand Expression?: Yes Does the patient have breastfeeding experience prior to this delivery?: Yes  Feeding Feeding Type: Breast Fed  LATCH Score Latch: Grasps breast easily, tongue down, lips flanged, rhythmical sucking.  Audible Swallowing: A few with stimulation  Type of Nipple: Everted at rest and after stimulation  Comfort (Breast/Nipple): Soft / non-tender  Hold (Positioning): No assistance needed to correctly position infant at breast.  LATCH Score: 9  Interventions Interventions: Breast feeding basics reviewed;Support pillows;Breast massage;Hand express;Breast compression;Position options  Lactation Tools Discussed/Used WIC Program: Yes   Consult Status Consult Status: Follow-up Date: 12/30/18 Follow-up type: In-patient    Katonya Blecher, Elta Guadeloupe 12/29/2018, 1:52 AM

## 2018-12-30 ENCOUNTER — Encounter (HOSPITAL_COMMUNITY): Payer: Self-pay | Admitting: *Deleted

## 2018-12-30 DIAGNOSIS — Z30017 Encounter for initial prescription of implantable subdermal contraceptive: Secondary | ICD-10-CM

## 2018-12-30 DIAGNOSIS — Z975 Presence of (intrauterine) contraceptive device: Secondary | ICD-10-CM

## 2018-12-30 LAB — BASIC METABOLIC PANEL
Anion gap: 7 (ref 5–15)
BUN: 8 mg/dL (ref 6–20)
CO2: 22 mmol/L (ref 22–32)
Calcium: 8.8 mg/dL — ABNORMAL LOW (ref 8.9–10.3)
Chloride: 110 mmol/L (ref 98–111)
Creatinine, Ser: 1.25 mg/dL — ABNORMAL HIGH (ref 0.44–1.00)
GFR calc Af Amer: >60 mL/min (ref >60)
GFR calc non Af Amer: 56 mL/min — ABNORMAL LOW (ref >60)
Glucose, Bld: 88 mg/dL (ref 70–99)
Potassium: 4.3 mmol/L (ref 3.5–5.1)
Sodium: 139 mmol/L (ref 135–145)

## 2018-12-30 LAB — GLUCOSE, CAPILLARY: Glucose-Capillary: 88 mg/dL (ref 70–99)

## 2018-12-30 MED ORDER — PRENATAL MULTIVITAMIN CH: 1.0000 | ORAL_TABLET | Freq: Every day | ORAL | 0 refills | Status: DC

## 2018-12-30 MED ORDER — OXYCODONE-ACETAMINOPHEN 5-325 MG PO TABS: 1.0000 | ORAL_TABLET | ORAL | 0 refills | Status: DC | PRN

## 2018-12-30 MED ORDER — IBUPROFEN 800 MG PO TABS: 800.0000 mg | ORAL_TABLET | Freq: Four times a day (QID) | ORAL | 0 refills | Status: DC

## 2018-12-30 NOTE — Lactation Note (Signed)
This note was copied from a baby's chart. Lactation Consultation Note:  Mother reports that she feels slight stinging when infant slides to the tip of the breast. Mother denies having any scabs or redness . Mother reports that infant opens his mouth wide and that she heres him swallow.  Mother advised to cue base feed infant and feed at least 8-12 times or more in 24 hours. Mother to allow for cluster feeding . Discussed normal newborn behavior.  Discussed treatment and prevention of engorgement. Mother has a hand pump at the bedside. She has been using it to pre pump then latch infant. Discussed S/S of Mastitis. Advised mother to nap and rest frequently with a toddler at home. Advised to protect her milk supply. Suggested that she follow up with [facility] services, out pt dept or phone office for breastfeeding questions or concerns.  Mother receptive to all teaching.  Patient Name: Boy Azalea Cedar [id number] Date: 12/30/2018 Reason for consult: Follow-up assessment   Maternal Data    Feeding Feeding Type: Breast Fed  LATCH Score Latch: Grasps breast easily, tongue down, lips flanged, rhythmical sucking.  Audible Swallowing: Spontaneous and intermittent  Type of Nipple: Everted at rest and after stimulation  Comfort (Breast/Nipple): Filling, red/small blisters or bruises, mild/mod discomfort(mild tenderness)  Hold (Positioning): No assistance needed to correctly position infant at breast.  LATCH Score: 9  Interventions    Lactation Tools Discussed/Used     Consult Status Consult Status: Complete    Darla Lesches 12/30/2018, 11:37 AM

## 2018-12-30 NOTE — Procedures (Addendum)
Nexplanon Insertion Procedure Patient identified, informed consent performed, consent signed.   Patient does understand that irregular bleeding is a very common side effect of this medication. She was advised to have backup contraception for one week after placement. Patient is POD#2, no pregnancy test performed.  Appropriate time out taken.  Patient's left arm was prepped and draped in the usual sterile fashion. Patient was prepped with alcohol swab and then injected with 10 ml of 1% lidocaine.  She was prepped with betadine, Nexplanon removed from packaging,  Device confirmed in needle, then inserted full length of needle and withdrawn per handbook instructions. Nexplanon was able to palpated in the patient's arm; patient palpated the insert herself. There was minimal blood loss.  Patient insertion site covered with guaze and a pressure bandage to reduce any bruising.  The patient tolerated the procedure well and was given post procedure instructions.   Lot #: [id number] Expiration Date: 32/02/33  Corliss Blacker, [facility]   OB FELLOW ATTESTATION  I agree with the above resident's note.    Phill Myron, D.O. OB Fellow  12/30/2018, 2:15 PM

## 2018-12-31 ENCOUNTER — Telehealth: Payer: Self-pay

## 2018-12-31 ENCOUNTER — Other Ambulatory Visit: Payer: Self-pay | Admitting: Obstetrics

## 2018-12-31 DIAGNOSIS — G8918 Other acute postprocedural pain: Secondary | ICD-10-CM

## 2018-12-31 MED ORDER — OXYCODONE-ACETAMINOPHEN 5-325 MG PO TABS: 1.0000 | ORAL_TABLET | ORAL | 0 refills | Status: DC | PRN

## 2018-12-31 NOTE — Telephone Encounter (Signed)
TC to pt to make aware pt not ava. LVOM.

## 2018-12-31 NOTE — Telephone Encounter (Signed)
Percocet Rx. She can take either Tramadol or Percocet for pain while breast feeding.  Recommend taking the Percocet now and then taking the Tramadol later if she needs it after finishing Percocet.

## 2018-12-31 NOTE — Telephone Encounter (Signed)
TC from pt regarding medication request Pt wants pain Rx for incision Pt was given Percocet 5-325 #5 and IBP 800 mg #30 Pt states she does have tramadol unsure if it was safe to taking while nursing please advise

## 2019-01-06 ENCOUNTER — Other Ambulatory Visit: Payer: Self-pay

## 2019-01-06 ENCOUNTER — Encounter: Payer: Self-pay | Admitting: Obstetrics and Gynecology

## 2019-01-06 ENCOUNTER — Ambulatory Visit (INDEPENDENT_AMBULATORY_CARE_PROVIDER_SITE_OTHER): Payer: Medicaid Other | Admitting: Obstetrics and Gynecology

## 2019-01-06 VITALS — BP 137/82 | HR 66 | Wt 265.9 lb

## 2019-01-06 DIAGNOSIS — Z98891 History of uterine scar from previous surgery: Secondary | ICD-10-CM

## 2019-01-06 MED ORDER — TRIAMTERENE-HCTZ 37.5-25 MG PO TABS: 1.0000 | ORAL_TABLET | Freq: Every day | ORAL | 0 refills | Status: DC

## 2019-01-06 NOTE — Progress Notes (Signed)
Ms Crespo presents for wound check S/P LTCS on 12/28/18, had Prevena placed Discharged home on POD # 2 No complaints today except for some LE edema No bowel or bladder dysfunction. Tolerating diet Pain controlled  PE AF VSS Lungs clear Heart RRR Abd soft + BS  Prevena removed, wound healing well, 2 small areas of incisional separation, no evidence of infection Ext 1+ edema  A/P Post Op wound check        LE edema General wound care reviewed with pt. Maxazide x 7 days for edema F/U with PP appt

## 2019-01-06 NOTE — Progress Notes (Signed)
Pt is in the office post c section 12-28-18, pt has wound vac. Pt complains of swelling in feet, hands feeling numb at times, and occasional headache. Pt received nexplanon in the hospital.

## 2019-01-14 ENCOUNTER — Telehealth: Payer: Self-pay | Admitting: Obstetrics and Gynecology

## 2019-01-14 ENCOUNTER — Ambulatory Visit: Payer: Medicaid Other | Admitting: Obstetrics and Gynecology

## 2019-01-14 NOTE — Telephone Encounter (Signed)
Ms. Manfredonia left a voicemail on the nurse line requesting a refill on her fluid pill.   Routing to physician for approval.

## 2019-01-14 NOTE — Telephone Encounter (Signed)
Returned call to patient regarding her refill request.  Patient notified that her physician has declined the refill and that she only needed that for the seven days post op.  She has not other concerns at this time.  Confirmed upcoming appointments with her for her postpartum and repeat glucose challenge test.

## 2019-01-14 NOTE — Telephone Encounter (Signed)
No refill. Keep postpartum visit

## 2019-01-20 ENCOUNTER — Telehealth: Payer: Self-pay

## 2019-01-20 NOTE — Telephone Encounter (Signed)
FYI TC and vm  from [facility] Nurse Following up regarding pt having swelling in feet. Pt delivered 12/28/18 C-Section Pt denies any visual changes, no HA's, no swelling in hands  Had incision check on 01/14/19 PP  appt 01/27/19

## 2019-01-27 ENCOUNTER — Encounter: Payer: Self-pay | Admitting: Obstetrics & Gynecology

## 2019-01-27 ENCOUNTER — Ambulatory Visit (INDEPENDENT_AMBULATORY_CARE_PROVIDER_SITE_OTHER): Payer: Medicaid Other | Admitting: Obstetrics & Gynecology

## 2019-01-27 ENCOUNTER — Other Ambulatory Visit: Payer: Self-pay

## 2019-01-27 ENCOUNTER — Other Ambulatory Visit (HOSPITAL_COMMUNITY)
Admission: RE | Admit: 2019-01-27 | Discharge: 2019-01-27 | Disposition: A | Discharge status: Home / Self Care | Payer: Medicaid Other | Source: Ambulatory Visit | Attending: Obstetrics & Gynecology | Admitting: Obstetrics & Gynecology

## 2019-01-27 VITALS — BP 140/83 | HR 69 | Wt 252.2 lb

## 2019-01-27 DIAGNOSIS — N898 Other specified noninflammatory disorders of vagina: Secondary | ICD-10-CM | POA: Diagnosis not present

## 2019-01-27 DIAGNOSIS — Z1389 Encounter for screening for other disorder: Secondary | ICD-10-CM | POA: Diagnosis not present

## 2019-01-27 MED ORDER — NAPROXEN 500 MG PO TABS: 500.0000 mg | ORAL_TABLET | Freq: Two times a day (BID) | ORAL | 0 refills | Status: DC

## 2019-01-27 NOTE — Progress Notes (Signed)
Patient has no complaints today, currently has nexplanon.   Marland Kitchen.Post Partum Exam  Ashley Costa is a 33 y.o. G44P1001 female who presents for a postpartum visit. She is 4 weeks postpartum following a low cervical transverse Cesarean section. I have fully reviewed the prenatal and intrapartum course. The delivery was at 39.0 gestational weeks.  Anesthesia: spinal. Postpartum course has been good. Baby's course has been good. Baby is feeding by breast. Bleeding staining only. Bowel function is normal. Bladder function is normal. Patient is not sexually active. Contraception method is Nexplanon. Postpartum depression screening:neg She complains of some vaginal itching.  The following portions of the patient's history were reviewed and updated as appropriate: allergies, current medications, past family history, past medical history, past social history, past surgical history and problem list. Last pap smear done 1/20 and was Normal  Review of Systems Pertinent items are noted in HPI.    Objective:  currently breastfeeding.  General:  alert   Breasts:  inspection negative, no nipple discharge or bleeding, no masses or nodularity palpable  Lungs: clear to auscultation bilaterally  Heart:  regular rate and rhythm, S1, S2 normal, no murmur, click, rub or gallop  Abdomen: soft, non-tender; bowel sounds normal; no masses,  no organomegaly   Vulva:  not evaluated  Vagina: not evaluated  Cervix:  not evaluated  Corpus: not examined  Adnexa:  not evaluated  Rectal Exam: Not performed.        Assessment:    Normal  postpartum exam. Pap smear not done at today's visit.   Plan:   1. Contraception: Nexplanon placed 12/30/18. 2. H/O GDM with this pregnancy- will need 2 hour GTT in 2 weeks 3. Vaginal itching- wet prep sent

## 2019-01-31 LAB — CERVICOVAGINAL ANCILLARY ONLY
Bacterial vaginitis: POSITIVE — AB
Candida vaginitis: NEGATIVE
Trichomonas: NEGATIVE

## 2019-02-02 ENCOUNTER — Other Ambulatory Visit: Payer: Self-pay | Admitting: Obstetrics & Gynecology

## 2019-02-02 MED ORDER — METRONIDAZOLE 500 MG PO TABS: 500.0000 mg | ORAL_TABLET | Freq: Two times a day (BID) | ORAL | 0 refills | Status: DC

## 2019-02-02 NOTE — Progress Notes (Signed)
Flagyl prescribed for bv seen on wet prep. Patient notified via Mychart message.

## 2019-02-10 ENCOUNTER — Other Ambulatory Visit: Payer: Self-pay

## 2019-02-10 ENCOUNTER — Other Ambulatory Visit: Payer: Medicaid Other

## 2019-02-10 DIAGNOSIS — O0990 Supervision of high risk pregnancy, unspecified, unspecified trimester: Secondary | ICD-10-CM

## 2019-02-11 LAB — GLUCOSE TOLERANCE, 2 HOURS
Glucose, 2 hour: 68 mg/dL (ref 65–139)
Glucose, GTT - Fasting: 78 mg/dL (ref 65–99)

## 2019-02-23 ENCOUNTER — Telehealth: Payer: Self-pay

## 2019-02-23 NOTE — Telephone Encounter (Signed)
TC from pt requesting refill on Tramadol Pt was recently prescribed Naproxen for cramping pain pt notes no relief.   Please advise.

## 2019-02-23 NOTE — Telephone Encounter (Signed)
Naprosyn recommended.

## 2019-02-23 NOTE — Telephone Encounter (Signed)
Message sent to pt Mychart to make her aware.

## 2019-03-26 ENCOUNTER — Encounter: Payer: Medicaid Other | Admitting: Obstetrics and Gynecology

## 2019-04-07 ENCOUNTER — Other Ambulatory Visit: Payer: Self-pay

## 2019-04-07 MED ORDER — NAPROXEN 500 MG PO TABS: 500.0000 mg | ORAL_TABLET | Freq: Two times a day (BID) | ORAL | 0 refills | Status: DC

## 2019-04-07 NOTE — Telephone Encounter (Signed)
Torilyn, Brad  P [facility] Clinical Pool  Phone Number: [phone]        Refills have been requested for the following medications:    naproxen (NAPROSYN) 500 MG tablet Clarnce Flock, MD]   Preferred pharmacy: [facility] [place], [place] - [address] AT [address]   Requested Prescriptions   naproxen (NAPROSYN) 500 MG tablet       Sig: Take 1 tablet (500 mg total) by mouth 2 (two) times daily with a meal. As needed for menstrual cramps, do not take with other NSAIDs   Disp:  30 tablet  Refills:  0   Start: 04/07/2019   Class: Normal   Non-formulary   Last ordered: 2 months ago by Clarnce Flock, MD      To be filled at: [facility] [phone] - [place], [place] - [address] AT [address]      Medication Refill  [name]  Patient Medication Renewal Request Pool 5 hours ago (10:25 AM)     Refills have been requested for the following medications:      naproxen (NAPROSYN) 500 MG tablet Clarnce Flock, MD]  Preferred pharmacy: [facility] [id number] [place], [place] - [address] AT [address]

## 2019-04-14 ENCOUNTER — Encounter: Payer: Medicaid Other | Admitting: Obstetrics and Gynecology

## 2019-05-25 ENCOUNTER — Other Ambulatory Visit (HOSPITAL_COMMUNITY)
Admission: RE | Admit: 2019-05-25 | Discharge: 2019-05-25 | Disposition: A | Discharge status: Home / Self Care | Payer: Medicaid Other | Source: Ambulatory Visit | Attending: Obstetrics and Gynecology | Admitting: Obstetrics and Gynecology

## 2019-05-25 ENCOUNTER — Ambulatory Visit (INDEPENDENT_AMBULATORY_CARE_PROVIDER_SITE_OTHER): Payer: Medicaid Other | Admitting: Obstetrics and Gynecology

## 2019-05-25 ENCOUNTER — Encounter: Payer: Self-pay | Admitting: Obstetrics and Gynecology

## 2019-05-25 ENCOUNTER — Other Ambulatory Visit: Payer: Self-pay

## 2019-05-25 VITALS — BP 124/83 | HR 54 | Wt 258.4 lb

## 2019-05-25 DIAGNOSIS — N870 Mild cervical dysplasia: Secondary | ICD-10-CM | POA: Diagnosis not present

## 2019-05-25 DIAGNOSIS — R87613 High grade squamous intraepithelial lesion on cytologic smear of cervix (HGSIL): Secondary | ICD-10-CM | POA: Insufficient documentation

## 2019-05-25 DIAGNOSIS — Z3202 Encounter for pregnancy test, result negative: Secondary | ICD-10-CM

## 2019-05-25 LAB — POCT URINE PREGNANCY: Preg Test, Ur: NEGATIVE

## 2019-05-25 NOTE — Progress Notes (Signed)
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  Placed in lithotomy position. Cervix viewed with speculum and colposcope after application of acetic acid.   Colposcopy adequate?  yes Acetowhite lesions? Yes from 4-7  Punctation? no Mosaicism?  no Abnormal vasculature?  no Biopsies? Yes 5 and 7 ECC? yes  COMMENTS:  Patient was given post procedure instructions.  She will return in 2 weeks for results.  Mora Bellman, MD

## 2019-05-25 NOTE — Patient Instructions (Signed)
Loop Electrosurgical Excision Procedure Loop electrosurgical excision procedure (LEEP) is the cutting and removal (excision) of tissue from the cervix. The cervix is the bottom part of the uterus that opens into the vagina. The tissue that is removed from the cervix is examined to see if there are precancerous cells or cancer cells present. LEEP may be done when:  You have abnormal bleeding from your cervix.  You have an abnormal Pap test result.  Your health care provider finds an abnormality on your cervix during a pelvic exam. LEEP typically only takes a few minutes and is often done in the health care provider's office. The procedure is safe for women who are trying to get pregnant. However, the procedure is usually not done during a menstrual period or during pregnancy. Tell a health care provider about:  Any allergies you have.  All medicines you are taking, including vitamins, herbs, eye drops, creams, and over-the-counter medicines.  Any blood disorders you have.  Any medical conditions you have, including current or past vaginal infections such as herpes or sexually-transmitted infections (STIs).  Whether you are pregnant or may be pregnant.  Whether or not you are having vaginal bleeding on the day of the procedure. What are the risks? Generally, this is a safe procedure. However, problems may occur, including:  Infection.  Bleeding.  Allergic reactions to medicines.  Changes or scarring in the cervix.  Increased risk of early (preterm) labor in future pregnancies. What happens before the procedure?  Ask your health care provider about: ? Changing or stopping your regular medicines. This is especially important if you are taking diabetes medicines or blood thinners. ? Taking medicines such as aspirin and ibuprofen. These medicines can thin your blood. Do not take these medicines unless your health care provider tells you to take them. ? Taking over-the-counter  medicines, vitamins, herbs, and supplements.  Your health care provider may recommend that you take pain medicine before the procedure.  Ask your health care provider if you should plan to have someone take you home after the procedure. What happens during the procedure?   An instrument called a speculum will be placed in your vagina. This will allow your health care provider to see your cervix.  You will be given a medicine to numb the area (local anesthetic). The medicine will be injected into your cervix and the surrounding area.  A solution will be applied to your cervix. This solution will help the health care provider find the abnormal cells that need to be removed.  A thin wire loop will be passed through your vagina. The wire will be used to burn (cauterize) the cervical tissue with an electrical current.  You may feel faint during the procedure. Tell your health care provider right away if you feel this way.  The abnormal cervical tissue will be removed.  Any open blood vessels will be cauterized to prevent bleeding.  A paste may be applied to the cauterized area of your cervix to help prevent bleeding.  The sample of cervical tissue will be examined under a microscope. The procedure may vary among health care providers and hospitals. What can I expect after the procedure? After the procedure, it is common to have:  Mild abdominal cramps that are similar to menstrual cramps. These may last for up to 1 week.  A small amount of pink-tinged or bloody vaginal discharge, including light to moderate bleeding, for 1-2 weeks.  A dark-colored discharge coming from your vagina. This is from   the paste that was used on the cervix to prevent bleeding. It is up to you to get the results of your procedure. Ask your health care provider, or the department that is doing the procedure, when your results will be ready. Follow these instructions at home:  Take over-the-counter and  prescription medicines only as told by your health care provider.  Return to your normal activities as told by your health care provider. Ask your health care provider what activities are safe for you.  Do not put anything in your vagina for 2 weeks after the procedure or until your health care provider says that it is okay. This includes tampons, creams, and douches.  Do not have sex until your health care provider approves.  Keep all follow-up visits as told by your health care provider. This is important. Contact a health care provider if you:  Have a fever or chills.  Feel unusually weak.  Have vaginal bleeding that is heavier or longer than a normal menstrual cycle. A sign of this can be soaking a pad with blood or bleeding with clots.  Develop a bad smelling vaginal discharge.  Have severe abdominal pain or cramping. Summary  Loop electrosurgical excision procedure (LEEP) is the removal of tissue from the cervix. The removed tissue will be checked for precancerous cells or cancer cells.  LEEP typically only takes a few minutes and is often done in the health care provider's office.  Do not put anything in your vagina for 2 weeks after the procedure or until your health care provider says that it is okay. This includes tampons, creams, and douches.  Keep all follow-up visits as told by your health care provider. Ask your health care provider, or the department that is doing the procedure, when your results will be ready. This information is not intended to replace advice given to you by your health care provider. Make sure you discuss any questions you have with your health care provider. Document Released: 08/11/2002 Document Revised: 06/13/2018 Document Reviewed: 06/13/2018 Elsevier Patient Education  2020 [employer].

## 2019-05-27 LAB — SURGICAL PATHOLOGY

## 2019-06-17 ENCOUNTER — Other Ambulatory Visit: Payer: Self-pay

## 2019-06-17 DIAGNOSIS — N922 Excessive menstruation at puberty: Secondary | ICD-10-CM

## 2019-06-19 ENCOUNTER — Other Ambulatory Visit: Payer: Self-pay | Admitting: Obstetrics

## 2019-06-19 DIAGNOSIS — N809 Endometriosis, unspecified: Secondary | ICD-10-CM

## 2019-06-19 NOTE — Telephone Encounter (Signed)
Referred to Dr. Zigmund Daniel for management of endometriosis.

## 2019-06-22 ENCOUNTER — Encounter: Payer: Medicaid Other | Admitting: Obstetrics and Gynecology

## 2019-06-22 ENCOUNTER — Other Ambulatory Visit: Payer: Self-pay | Admitting: Obstetrics

## 2019-06-22 DIAGNOSIS — R102 Pelvic and perineal pain: Secondary | ICD-10-CM

## 2019-06-22 DIAGNOSIS — G8929 Other chronic pain: Secondary | ICD-10-CM

## 2019-11-20 IMAGING — US US OB < 14 WEEKS - US OB TV
1 series · 15 of 28 positions shown · non-contrast
Comparison: None.

CLINICAL DATA: Pregnant, abdominal pain

EXAM:
OBSTETRIC <14 WK US AND TRANSVAGINAL OB US
TECHNIQUE: Both transabdominal and transvaginal ultrasound examinations were
performed for complete evaluation of the gestation as well as the
maternal uterus, adnexal regions, and pelvic cul-de-sac.
Transvaginal technique was performed to assess early pregnancy.

[Series 1: us ob < 14 weeks - us ob tv · 51 acquisitions, 15 frames shown]
[im 1/51]
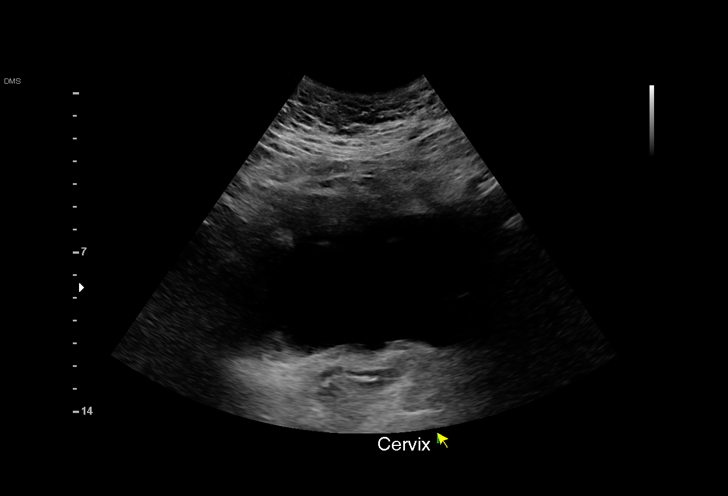
[im 4/51]
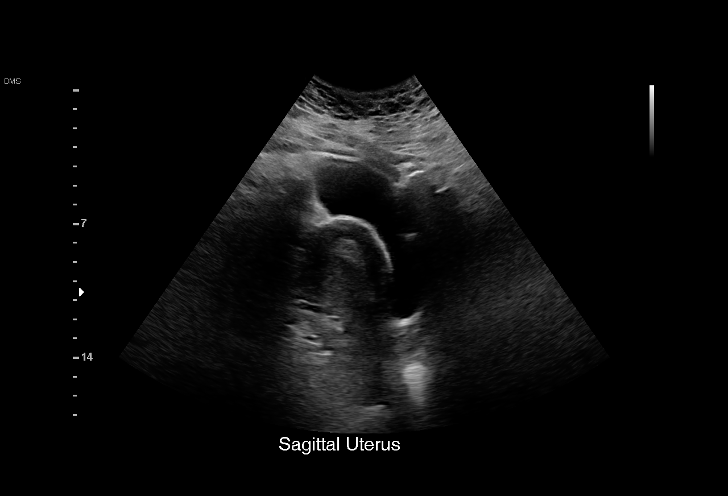
[im 8/51]
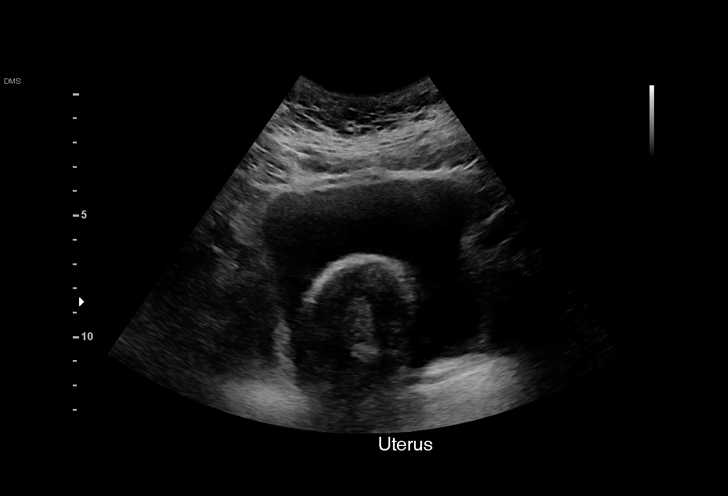
[im 12/51]
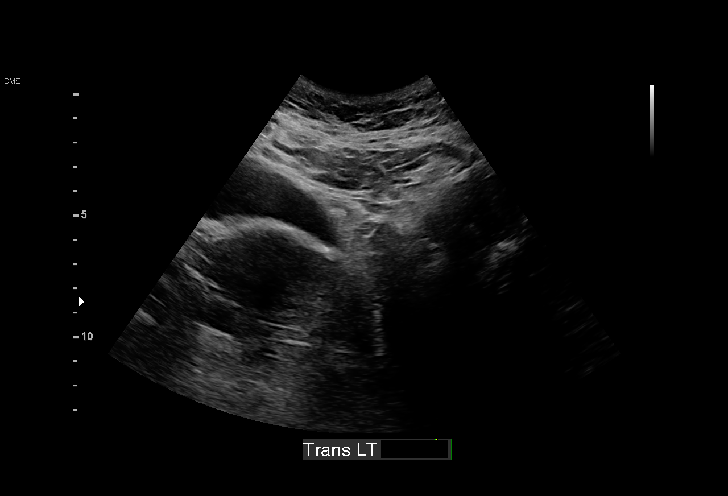
[im 15/51]
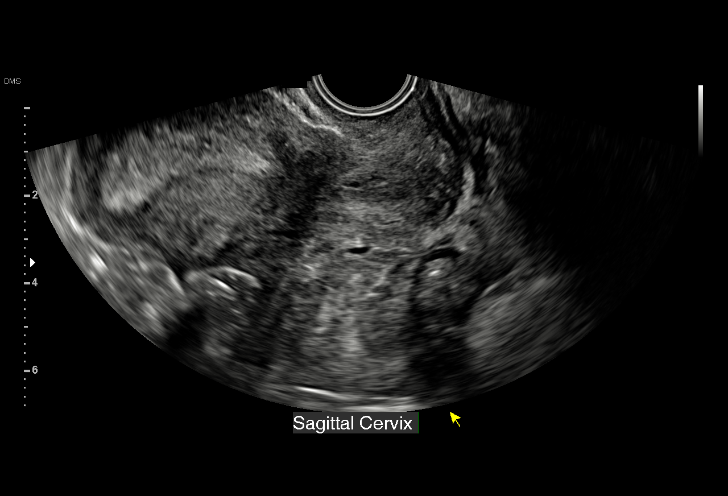
[im 19/51]
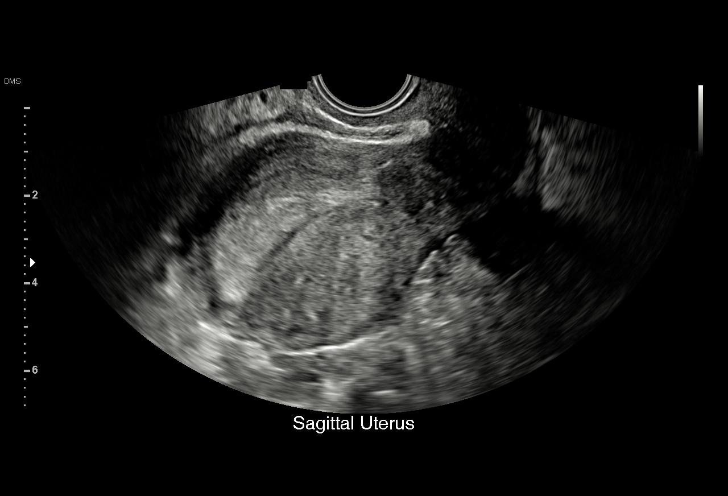
[im 23/51]
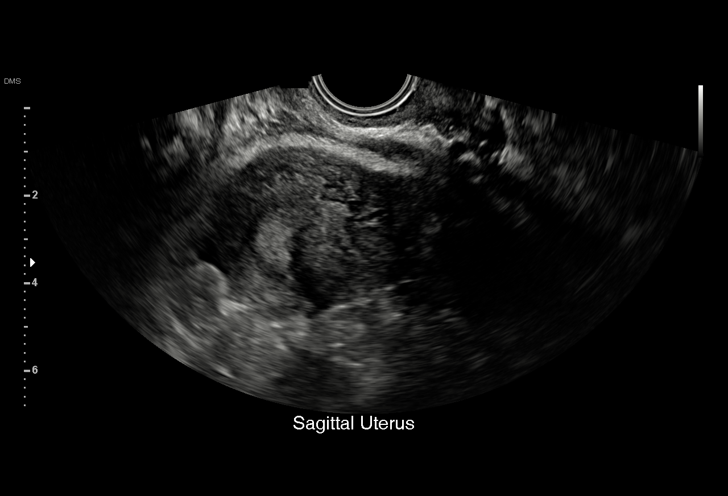
[im 26/51]
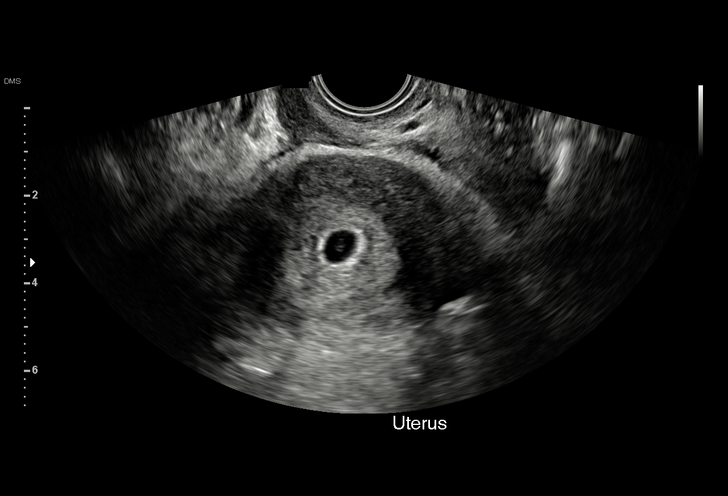
[im 28/51]
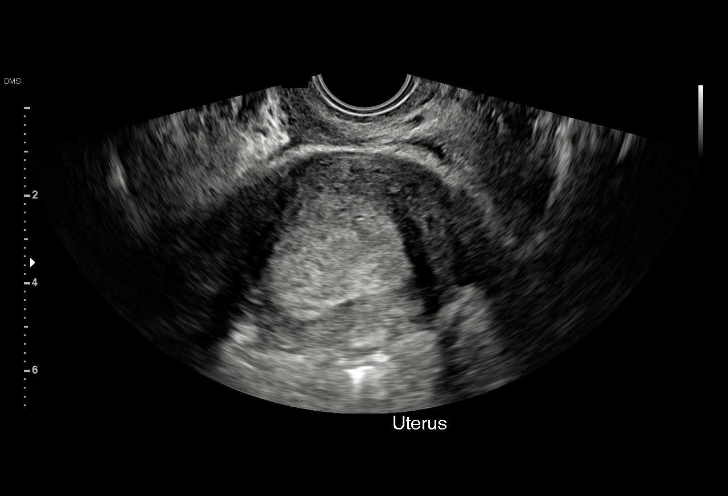
[im 32/51]
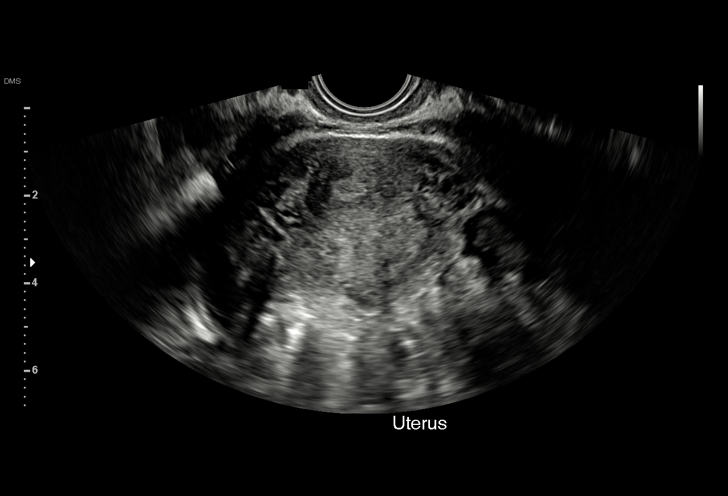
[im 36/51]
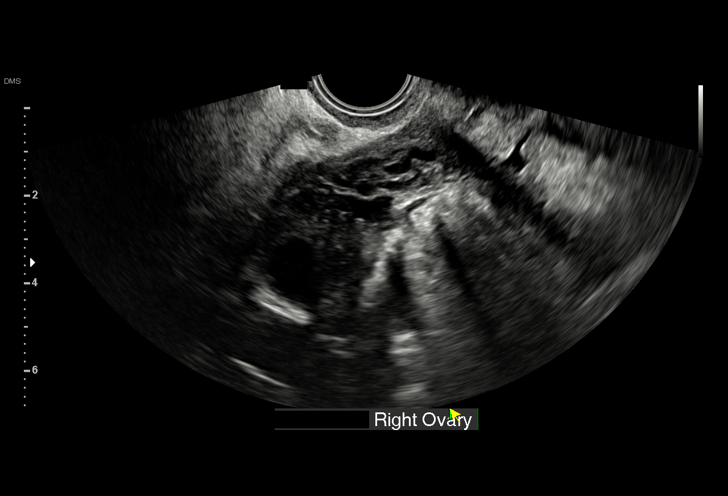
[im 39/51]
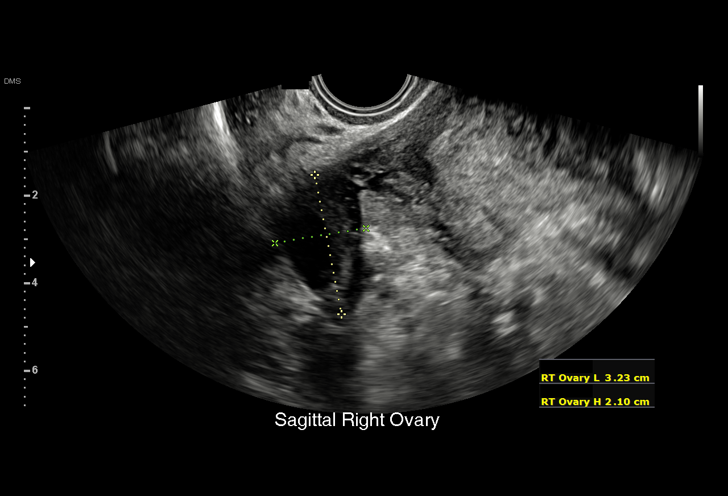
[im 43/51]
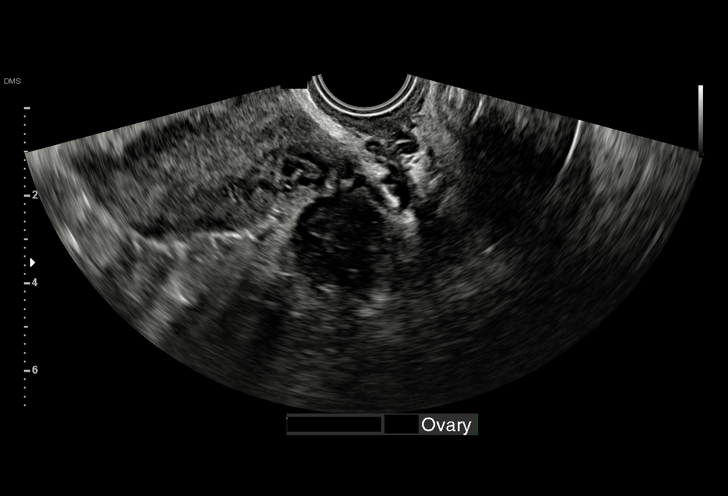
[im 47/51]
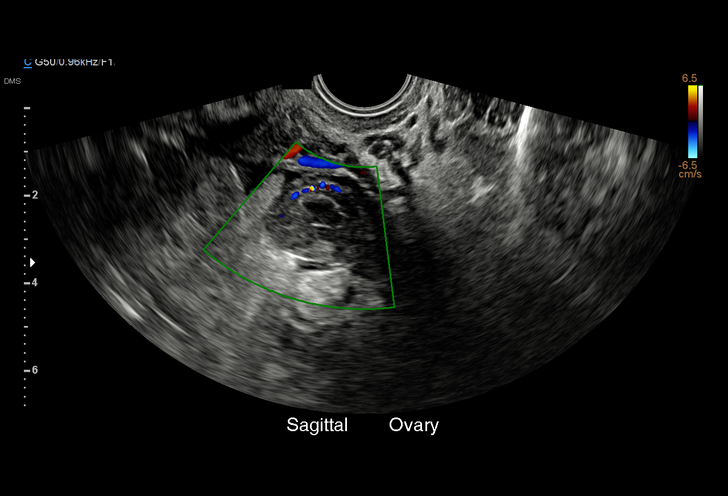
[im 51/51]
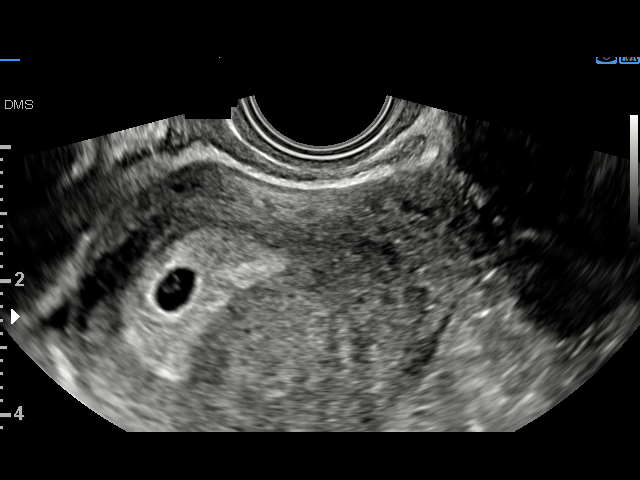

[15 of 28 positions shown; findings below may reference images not displayed]

FINDINGS: Intrauterine gestational sac: Single

Yolk sac:  Not Visualized.

Embryo:  Not Visualized.

Cardiac Activity: Not Visualized.

MSD: 6.4 mm   5 w   2  d

Subchorionic hemorrhage:  None visualized.

Maternal uterus/adnexae: Bilateral ovaries are within normal limits,
noting a right corpus luteal cyst.

Trace pelvic fluid.
IMPRESSION: Single intrauterine gestational sac with yolk sac, measuring 5 weeks
2 days by mean sac diameter. No fetal pole is visualized likely
related to early gestation.

Consider follow-up pelvic ultrasound in 14 days to confirm
viability, as clinically warranted.

## 2020-07-05 IMAGING — US US MFM FETAL BPP WO NON STRESS
1 series · 15 of 26 positions shown · non-contrast
Comparison: none

[Series 1: us mfm fetal bpp wo non stress · 26 acquisitions, 15 frames shown]
[im 1/26]
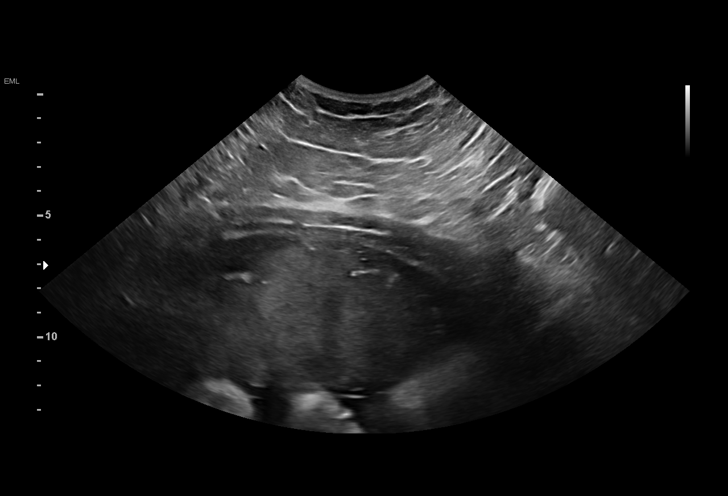
[im 3/26]
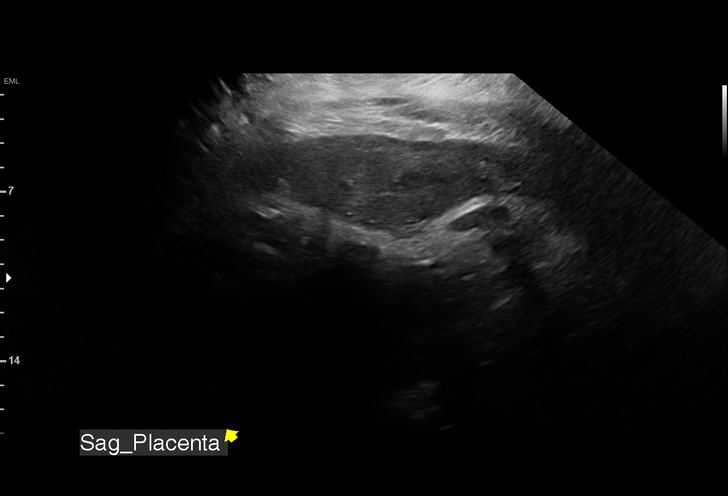
[im 5/26]
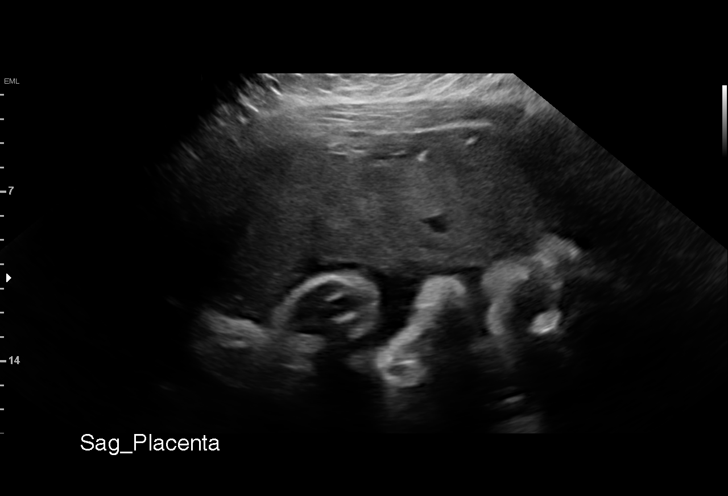
[im 7/26]
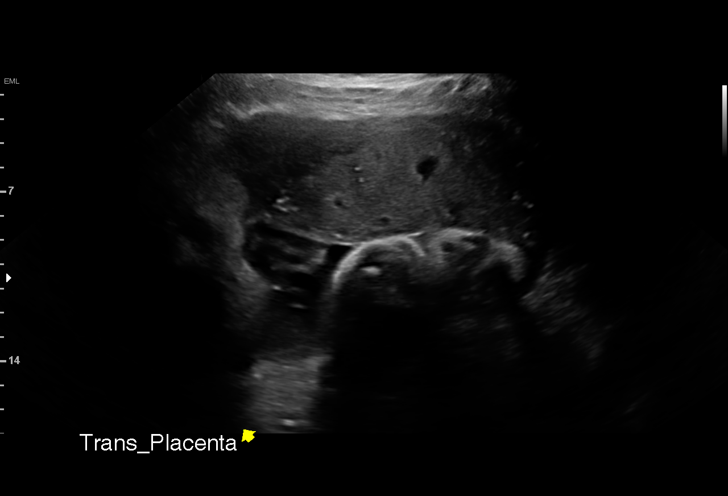
[im 8/26]
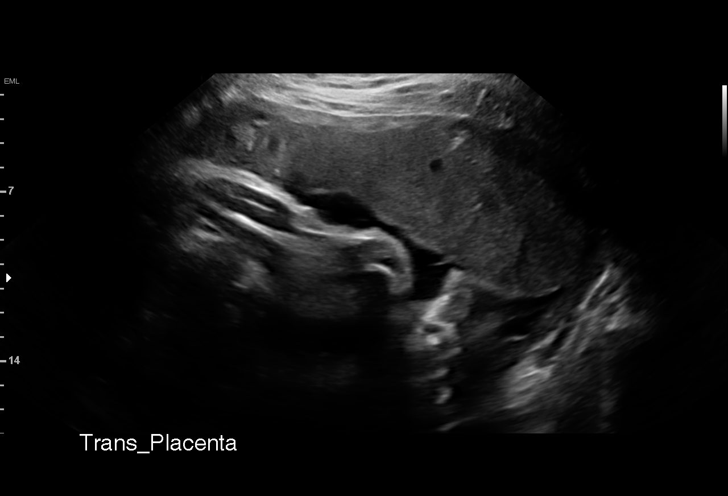
[im 10/26]
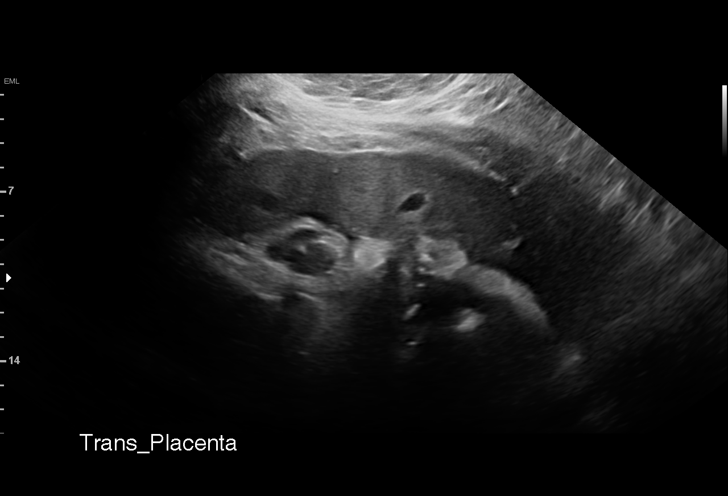
[im 12/26]
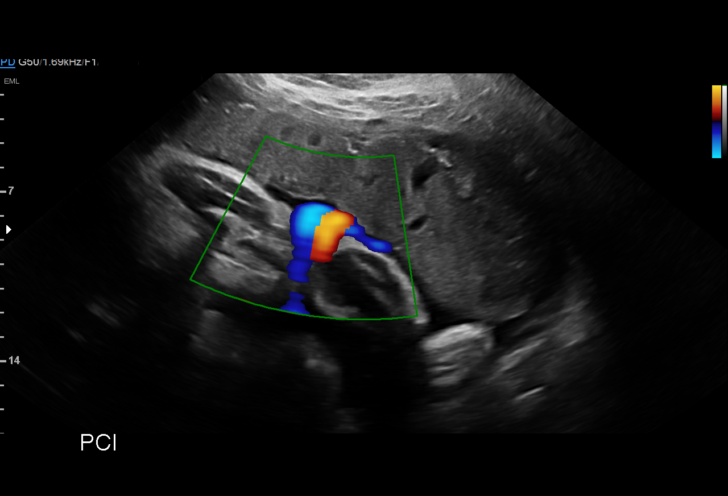
[im 14/26]
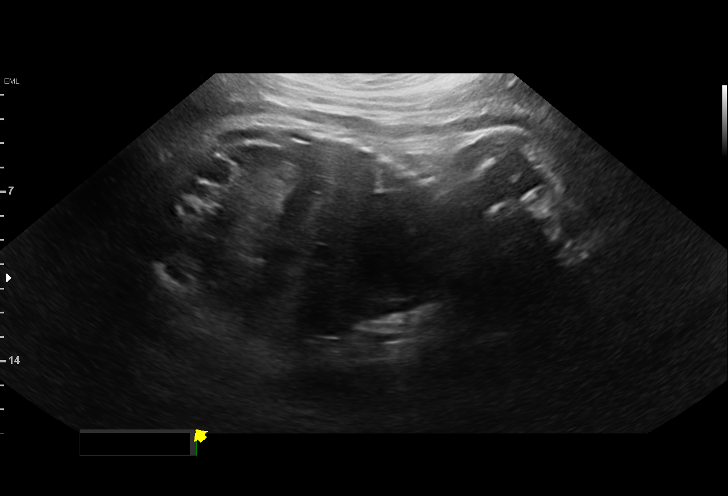
[im 15/26]
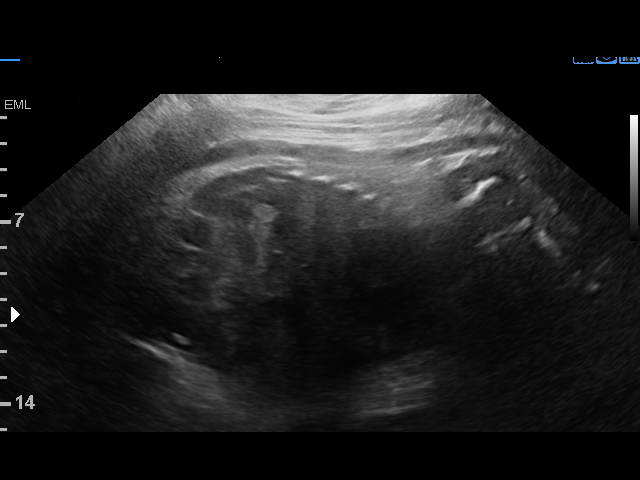
[im 17/26]
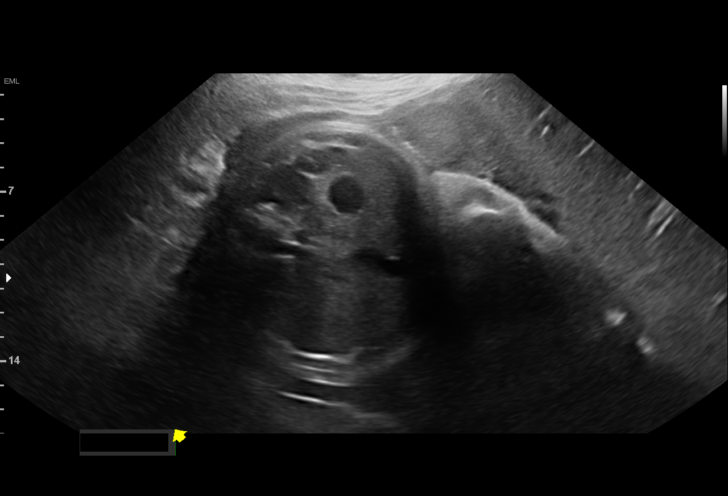
[im 19/26]
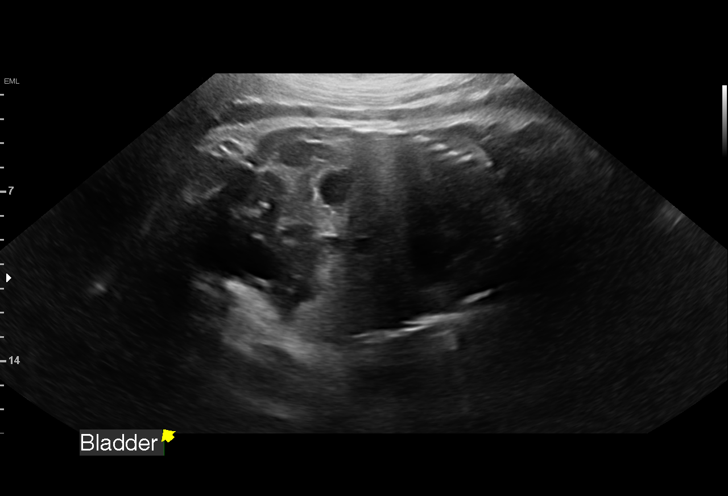
[im 20/26]
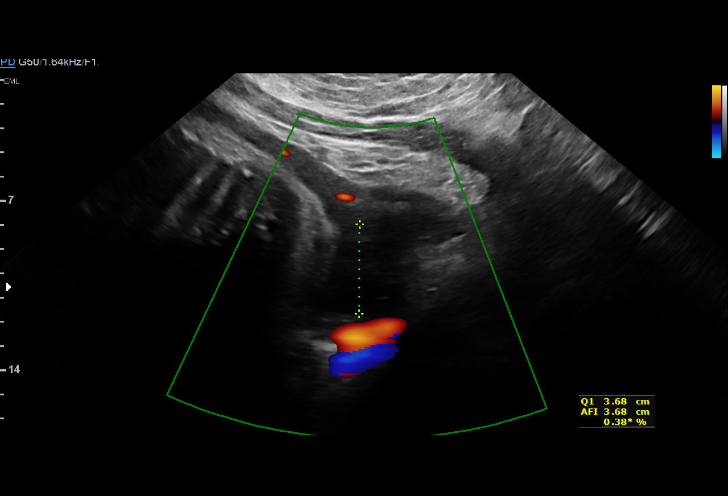
[im 22/26]
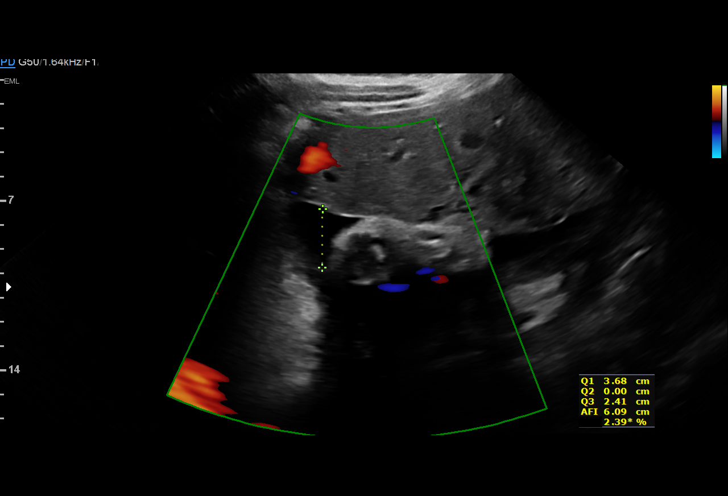
[im 24/26]
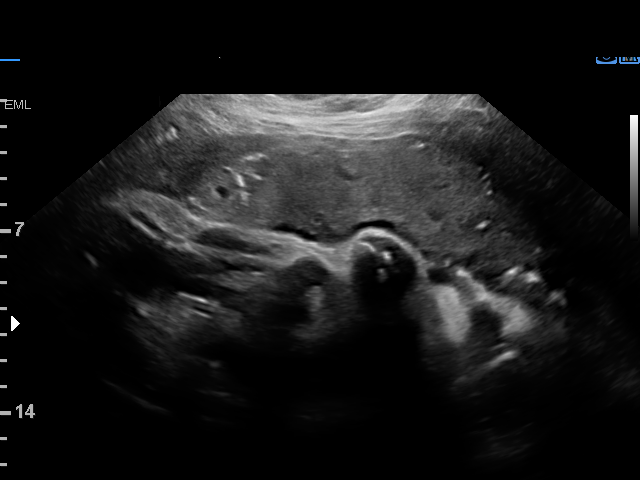
[im 26/26]
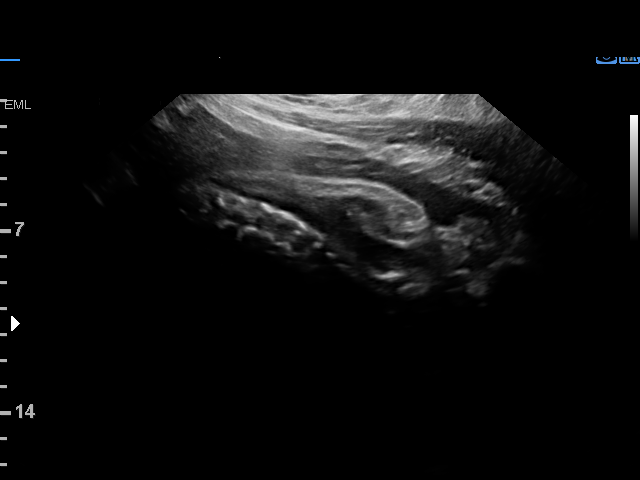

[15 of 26 positions shown; findings below may reference images not displayed]

----------------------------------------------------------------------

 ----------------------------------------------------------------------
Indications

  38 weeks gestation of pregnancy
  Gestational diabetes in pregnancy, diet
  controlled
  Encounter for other antenatal screening
  follow-up
  Obesity complicating pregnancy, third
  trimester (BMI 45)(Low Risk NIPS)(Negative
  AFP)
  Tobacco use complicating pregnancy, third
  trimester
  History of cesarean delivery, currently
  pregnant
 ----------------------------------------------------------------------
Vital Signs

 BMI:
Fetal Evaluation

 Num Of Fetuses:         1
 Fetal Heart Rate(bpm):  143
 Cardiac Activity:       Observed
 Presentation:           Cephalic
 Placenta:               Anterior
 P. Cord Insertion:      Visualized, central

 Amniotic Fluid
 AFI FV:      Within normal limits

 AFI Sum(cm)     %Tile       Largest Pocket(cm)
 8.39            14
 RUQ(cm)       RLQ(cm)       LUQ(cm)        LLQ(cm)
 3.68          2.3           0
Biophysical Evaluation

 Amniotic F.V:   Within normal limits       F. Tone:        Observed
 F. Movement:    Observed                   Score:          [DATE]
 F. Breathing:   Observed
OB History

 Gravidity:    2         Term:   1        Prem:   0        SAB:   0
 TOP:          0       Ectopic:  0        Living: 1
Gestational Age

 LMP:           38w 2d        Date:  03/30/18                 EDD:   01/04/19
 Best:          38w 2d     Det. By:  LMP  (03/30/18)          EDD:   01/04/19
Anatomy

 Diaphragm:             Appears normal         Kidneys:                Appear normal
 Stomach:               Appears normal, left   Bladder:                Appears normal
                        sided
Impression

 Biophysical profile [DATE]
 [place] delivery on [DATE]
Recommendations

 Follow up as clinically indicated.

## 2021-02-02 ENCOUNTER — Ambulatory Visit: Payer: Medicaid Other | Admitting: Podiatry

## 2021-02-02 ENCOUNTER — Other Ambulatory Visit: Payer: Self-pay

## 2021-02-02 ENCOUNTER — Encounter: Payer: Self-pay | Admitting: Podiatry

## 2021-02-02 DIAGNOSIS — L600 Ingrowing nail: Secondary | ICD-10-CM | POA: Diagnosis not present

## 2021-02-02 NOTE — Progress Notes (Signed)
Subjective:   Patient ID: Ashley Costa, female   DOB: 35 y.o.   MRN: [id number]   HPI Patient presents stating she traumatized her right big toenail right and its feels like it is falling off and she is also concerned about fungus of adjacent nails   ROS      Objective:  Physical Exam  Neurovascular status intact with traumatized right hallux nail bed that is partially loose and detached in the distal two thirds mildly painful with other nails that have thickness and deformity associated with them     Assessment:  Damaged right hallux nail with loss of nail bed continuity and thickened other nails     Plan:  H&P reviewed condition anesthetized the right hallux 60 mg like Marcaine mixture sterile prep and removed the right hallux nail exposed the base applied Neosporin with sterile dressing instructed on soaks and allow nail to regrow explained it may not remit regrow normally.  Discussed other nails do not recommend current treatment

## 2021-03-09 ENCOUNTER — Encounter: Payer: Self-pay | Admitting: Pulmonary Disease

## 2021-03-09 ENCOUNTER — Ambulatory Visit (INDEPENDENT_AMBULATORY_CARE_PROVIDER_SITE_OTHER): Payer: Medicaid Other | Admitting: Pulmonary Disease

## 2021-03-09 ENCOUNTER — Other Ambulatory Visit: Payer: Self-pay

## 2021-03-09 VITALS — BP 124/80 | HR 94 | Temp 98.3°F | Resp 99 | Ht 64.0 in | Wt 284.2 lb

## 2021-03-09 DIAGNOSIS — R400 Somnolence: Secondary | ICD-10-CM | POA: Diagnosis not present

## 2021-03-09 NOTE — Patient Instructions (Signed)
Moderate probability of significant obstructive sleep apnea  We will schedule you for home sleep study Update your results as soon as reviewed  Continue weight loss efforts Smoking cessation  I will see you back in about 3 months  Sleep Apnea Sleep apnea affects breathing during sleep. It causes breathing to stop for 10 seconds or more, or to become shallow. People with sleep apnea usually snore loudly. It can also increase the risk of: Heart attack. Stroke. Being very overweight (obese). Diabetes. Heart failure. Irregular heartbeat. High blood pressure. The goal of treatment is to help you breathe normally again. What are the causes? The most common cause of this condition is a collapsed or blocked airway. There are three kinds of sleep apnea: Obstructive sleep apnea. This is caused by a blocked or collapsed airway. Central sleep apnea. This happens when the brain does not send the right signals to the muscles that control breathing. Mixed sleep apnea. This is a combination of obstructive and central sleep apnea. What increases the risk? Being overweight. Smoking. Having a small airway. Being older. Being female. Drinking alcohol. Taking medicines to calm yourself (sedatives or tranquilizers). Having family members with the condition. Having a tongue or tonsils that are larger than normal. What are the signs or symptoms? Trouble staying asleep. Loud snoring. Headaches in the morning. Waking up gasping. Dry mouth or sore throat in the morning. Being sleepy or tired during the day. If you are sleepy or tired during the day, you may also: Not be able to focus your mind (concentrate). Forget things. Get angry a lot and have mood swings. Feel sad (depressed). Have changes in your personality. Have less interest in sex, if you are female. Be unable to have an erection, if you are female. How is this treated?  Sleeping on your side. Using a medicine to get rid of mucus in  your nose (decongestant). Avoiding the use of alcohol, medicines to help you relax, or certain pain medicines (narcotics). Losing weight, if needed. Changing your diet. Quitting smoking. Using a machine to open your airway while you sleep, such as: An oral appliance. This is a mouthpiece that shifts your lower jaw forward. A CPAP device. This device blows air through a mask when you breathe out (exhale). An EPAP device. This has valves that you put in each nostril. A BPAP device. This device blows air through a mask when you breathe in (inhale) and breathe out. Having surgery if other treatments do not work. Follow these instructions at home: Lifestyle Make changes that your doctor recommends. Eat a healthy diet. Lose weight if needed. Avoid alcohol, medicines to help you relax, and some pain medicines. Do not smoke or use any products that contain nicotine or tobacco. If you need help quitting, ask your doctor. General instructions Take over-the-counter and prescription medicines only as told by your doctor. If you were given a machine to use while you sleep, use it only as told by your doctor. If you are having surgery, make sure to tell your doctor you have sleep apnea. You may need to bring your device with you. Keep all follow-up visits. Contact a doctor if: The machine that you were given to use during sleep bothers you or does not seem to be working. You do not get better. You get worse. Get help right away if: Your chest hurts. You have trouble breathing in enough air. You have an uncomfortable feeling in your back, arms, or stomach. You have trouble talking. One  side of your body feels weak. A part of your face is hanging down. These symptoms may be an emergency. Get help right away. Call your local emergency services (911 in the [place] Do not wait to see if the symptoms will go away. Do not drive yourself to the hospital. Summary This condition affects breathing during  sleep. The most common cause is a collapsed or blocked airway. The goal of treatment is to help you breathe normally while you sleep. This information is not intended to replace advice given to you by your health care provider. Make sure you discuss any questions you have with your health care provider. Document Revised: 04/29/2020 Document Reviewed: 04/29/2020 Elsevier Patient Education  2022 [employer].

## 2021-03-09 NOTE — Progress Notes (Signed)
Ashley Costa    [id number]    07-Mar-1986  Primary Care Physician:Patient, No Pcp Per (Inactive)  Referring Physician: Trey Sailors, [address] [place],  [place] [id number]  Chief complaint:   Patient being seen for snoring, nonrestorative sleep  HPI:  Snoring, nonrestorative sleep, witnessed apneas Heroic snoring  Has tried to use nasal strips, decongestants to no avail  Usually goes to bed between about 830 and 9, sleeps about 6:45 AM Usually able to fall asleep immediately 2-3 awakenings  Weight is up about 20 pounds  Dad snored  Admits to dryness of the mouth in the morning Waking up choking dryness of the mouth in the mornings Occasional sore throat Memory is fair  Active smoker 1 pack a week  Has tried elevating head of the bed, sleeping on her stomach-still snores with this changes   Outpatient Encounter Medications as of 03/09/2021  Medication Sig   metroNIDAZOLE (FLAGYL) 500 MG tablet Take 1 tablet (500 mg total) by mouth 2 (two) times daily. (Patient not taking: No sig reported)   naproxen (NAPROSYN) 500 MG tablet Take 1 tablet (500 mg total) by mouth 2 (two) times daily with a meal. As needed for menstrual cramps, do not take with other NSAIDs (Patient not taking: No sig reported)   oxyCODONE-acetaminophen (PERCOCET/ROXICET) 5-325 MG tablet Take 1-2 tablets by mouth every 4 (four) hours as needed for moderate pain. (Patient not taking: No sig reported)   Prenatal Vit-Fe Fumarate-FA (PRENATAL MULTIVITAMIN) TABS tablet Take 1 tablet by mouth daily at 12 noon. (Patient not taking: No sig reported)   triamterene-hydrochlorothiazide (MAXZIDE-25) 37.5-25 MG tablet Take 1 tablet by mouth daily for 7 days.   No facility-administered encounter medications on file as of 03/09/2021.    Allergies as of 03/09/2021   (No Known Allergies)    Past Medical History:  Diagnosis Date   Endometriosis 2015   Gestational diabetes    Pap smear  abnormality of cervix    Smoker     Past Surgical History:  Procedure Laterality Date   CESAREAN SECTION N/A 11/05/2016   Procedure: CESAREAN SECTION;  Surgeon: Aletha Halim, MD;  Location: [facility];  Service: Obstetrics;  Laterality: N/A;   CESAREAN SECTION N/A 12/28/2018   Procedure: CESAREAN SECTION;  Surgeon: Osborne Oman, MD;  Location: [facility];  Service: Obstetrics;  Laterality: N/A;   TONSILECTOMY/ADENOIDECTOMY WITH MYRINGOTOMY     Wisdom teeth abstracted      Family History  Problem Relation Age of Onset   Diabetes Father    Hypertension Father    Diabetes Mother    Hypertension Mother     Social History   Socioeconomic History   Marital status: Married    Spouse name: Not on file   Number of children: 1   Years of education: Not on file   Highest education level: Not on file  Occupational History   Not on file  Tobacco Use   Smoking status: Some Days    Packs/day: 0.25    Types: Cigarettes   Smokeless tobacco: Never   Tobacco comments:    States she smokes a pack a week  Vaping Use   Vaping Use: Never used  Substance and Sexual Activity   Alcohol use: Not Currently    Comment: occasionally   Drug use: No   Sexual activity: Not Currently    Birth control/protection: None, Implant  Other Topics Concern   Not  on file  Social History Narrative   Not on file   Social Determinants of Health   Financial Resource Strain: Not on file  Food Insecurity: Not on file  Transportation Needs: Not on file  Physical Activity: Not on file  Stress: Not on file  Social Connections: Not on file  Intimate Partner Violence: Not on file    Review of Systems  Constitutional:  Positive for fatigue.  Respiratory:  Negative for shortness of breath.   Psychiatric/Behavioral:  Positive for sleep disturbance.    Vitals:   03/09/21 0929  BP: 124/80  Pulse: 94  Resp: (!) 99  Temp: 98.3 F (36.8 C)  SpO2: 99%     Physical Exam Constitutional:       Appearance: She is obese.  HENT:     Head: Normocephalic.     Mouth/Throat:     Mouth: Mucous membranes are moist.     Comments: Macroglossia Eyes:     Pupils: Pupils are equal, round, and reactive to light.  Cardiovascular:     Rate and Rhythm: Normal rate and regular rhythm.     Heart sounds: No murmur heard.   No friction rub.  Pulmonary:     Effort: No respiratory distress.     Breath sounds: No stridor. No wheezing or rhonchi.  Musculoskeletal:     Cervical back: No rigidity or tenderness.  Neurological:     Mental Status: She is alert.  Psychiatric:        Mood and Affect: Mood normal.    Results of the Epworth flowsheet 03/09/2021  Sitting and reading 2  Watching TV 1  Sitting, inactive in a public place (e.g. a theatre or a meeting) 1  As a passenger in a car for an hour without a break 2  Lying down to rest in the afternoon when circumstances permit 3  Sitting and talking to someone 0  Sitting quietly after a lunch without alcohol 1  In a car, while stopped for a few minutes in traffic 0  Total score 10    Data Reviewed: No previous study on record  Assessment:  Excessive daytime sleepiness  Snoring  Moderate probability of significant obstructive sleep apnea  Obesity  Pathophysiology of sleep disordered breathing discussed with the patient Treatment options discussed with patient  Active smoker  Plan/Recommendations: Smoking cessation counseling  Schedule patient for a home sleep study  Weight loss efforts encouraged  Tentative follow-up in about 3 to 4 months  Encouraged to call with any significant concerns   Sherrilyn Rist MD [facility] 03/09/2021, 9:33 AM  CC: Trey Sailors, PA

## 2021-04-04 ENCOUNTER — Encounter: Payer: Self-pay | Admitting: Gastroenterology

## 2021-05-03 ENCOUNTER — Encounter: Payer: Self-pay | Admitting: Gastroenterology

## 2021-05-05 ENCOUNTER — Ambulatory Visit (INDEPENDENT_AMBULATORY_CARE_PROVIDER_SITE_OTHER): Payer: Medicaid Other | Admitting: Gastroenterology

## 2021-05-05 ENCOUNTER — Encounter: Payer: Self-pay | Admitting: Gastroenterology

## 2021-05-05 VITALS — BP 124/84 | HR 86 | Ht 64.0 in | Wt 286.5 lb

## 2021-05-05 DIAGNOSIS — K921 Melena: Secondary | ICD-10-CM | POA: Diagnosis not present

## 2021-05-05 DIAGNOSIS — R1033 Periumbilical pain: Secondary | ICD-10-CM

## 2021-05-05 DIAGNOSIS — R194 Change in bowel habit: Secondary | ICD-10-CM | POA: Diagnosis not present

## 2021-05-05 MED ORDER — SUTAB 1479-225-188 MG PO TABS: 1.0000 | ORAL_TABLET | Freq: Once | ORAL | 0 refills | Status: AC

## 2021-05-05 MED ORDER — DICYCLOMINE HCL 20 MG PO TABS: 20.0000 mg | ORAL_TABLET | Freq: Four times a day (QID) | ORAL | 1 refills | Status: DC

## 2021-05-05 NOTE — Progress Notes (Signed)
HPI : Ashley Costa is a very pleasant 35 year old female with a history of obesity and suspected OSA who is referred to [place] by Joneen Boers, PA, for further evaluation of hematochezia and new onset abdominal pain and change in bowel habits.  The patient started having frequent crampy abdominal pain several months ago.  Often the pain is improved with passing gas or stool, but not always.  The pain is usually dull in character, but sometimes is sharp and severe.  Around this time she also started having changes in her bowel habits, characterized by frequent urges to defecate with urgency.  She has been having 4-5 bowel movements per day, typically all clustered in the morning.  She frequently has to rush to get to the bathroom in time, but she has not had any incontinence.  Her stools are typically formed and soft but sometimes she has loose or watery stools.  No nocturnal bowel movements.  About a month ago she passed bright red blood per rectum multiple times during defecation.  No other changes in her symptoms at the time she was passing blood (continued to have crampy pain, urgency and frequent urges to defecate).  She has not seen any blood since that episode. Her appetite is good.  She has occasional nausea, but no vomiting.  No unintentional weight loss. She denies any medication changes that preceded her new GI symptoms. No dietary changes, although she has tried eating healthier since her symptoms started.   She is an active smoker, but is working with her PCM to quit. No family history of IBD, autoimmune disease or GI malignancy. She has heartburn, typically associated with certain foods and takes an OTC medication as needed (doesn't recall name, but thinks it's Pepcid)    Past Medical History:  Diagnosis Date   Endometriosis 2015   Essential hypertension    Gestational diabetes    Mixed hyperlipidemia    Morbid obesity ([facility])    Obstructive sleep apnea    Pap smear abnormality of  cervix    Smoker      Past Surgical History:  Procedure Laterality Date   CESAREAN SECTION N/A 11/05/2016   Procedure: CESAREAN SECTION;  Surgeon: Aletha Halim, MD;  Location: [facility];  Service: Obstetrics;  Laterality: N/A;   CESAREAN SECTION N/A 12/28/2018   Procedure: CESAREAN SECTION;  Surgeon: Osborne Oman, MD;  Location: [facility];  Service: Obstetrics;  Laterality: N/A;   TONSILECTOMY/ADENOIDECTOMY WITH MYRINGOTOMY     Wisdom teeth abstracted     Family History  Problem Relation Age of Onset   Diabetes Mother    Hypertension Mother    Diabetes Father    Hypertension Father    Stomach cancer Neg Hx    Pancreatic cancer Neg Hx    Esophageal cancer Neg Hx    Colon cancer Neg Hx    Social History   Tobacco Use   Smoking status: Every Day    Packs/day: 0.25    Types: Cigarettes   Smokeless tobacco: Never   Tobacco comments:    States she smokes a pack a week  Vaping Use   Vaping Use: Never used  Substance Use Topics   Alcohol use: Yes    Comment: 1 drink per day   Drug use: No   No current outpatient medications on file.   No current facility-administered medications for this visit.   No Known Allergies   Review of Systems: All systems reviewed and negative except  where noted in HPI.    No results found.  Physical Exam: BP 124/84   Pulse 86   Ht 5\' 4"  (1.626 m)   Wt 286 lb 8 oz (130 kg)   SpO2 98%   BMI 49.18 kg/m  Constitutional: Pleasant,obese, African [place] female in no acute distress. HEENT: Normocephalic and atraumatic. Conjunctivae are normal. No scleral icterus. Cardiovascular: Normal rate, regular rhythm.  Pulmonary/chest: Effort normal and breath sounds normal. No wheezing, rales or rhonchi. Abdominal: Soft, nondistended, nontender. Bowel sounds active throughout. There are no masses palpable. No hepatomegaly. Extremities: no edema Neurological: Alert and oriented to person place and time. Skin: Skin is warm and dry.  No rashes noted. Psychiatric: Normal mood and affect. Behavior is normal.  CBC    Component Value Date/Time   WBC 9.3 12/29/2018 0349   RBC 3.51 (L) 12/29/2018 0349   HGB 9.8 (L) 12/29/2018 0349   HGB 12.3 10/09/2018 0931   HCT 29.8 (L) 12/29/2018 0349   HCT 37.4 10/09/2018 0931   PLT 256 12/29/2018 0349   PLT 383 10/09/2018 0931   MCV 84.9 12/29/2018 0349   MCV 84 10/09/2018 0931   MCH 27.9 12/29/2018 0349   MCHC 32.9 12/29/2018 0349   RDW 15.9 (H) 12/29/2018 0349   RDW 14.8 10/09/2018 0931   LYMPHSABS 2.8 06/13/2018 1107   MONOABS 0.9 09/01/2007 2145   EOSABS 0.1 06/13/2018 1107   BASOSABS 0.0 06/13/2018 1107    CMP     Component Value Date/Time   NA 139 12/30/2018 0612   K 4.3 12/30/2018 0612   CL 110 12/30/2018 0612   CO2 22 12/30/2018 0612   GLUCOSE 88 12/30/2018 0612   BUN 8 12/30/2018 0612   CREATININE 1.25 (H) 12/30/2018 0612   CREATININE 0.99 08/21/2012 1431   CALCIUM 8.8 (L) 12/30/2018 0612   PROT 5.4 (L) 12/29/2018 0349   ALBUMIN 2.4 (L) 12/29/2018 0349   AST 24 12/29/2018 0349   ALT 13 12/29/2018 0349   ALKPHOS 99 12/29/2018 0349   BILITOT 0.4 12/29/2018 0349   GFRNONAA 56 (L) 12/30/2018 0612   GFRAA >60 12/30/2018 0612     ASSESSMENT AND PLAN: 35 year old female with several months of new abdominal pain and change in bowel habits characterized by fecal urgency and tenesmus with 1 episode of bright red blood per rectum month ago.  Most likely, the patient has bleeding from internal hemorrhoids and possibly symptoms suggestive of new onset IBS.  However, given her new symptoms and recent hematochezia, a colonoscopy to exclude inflammatory bowel disease is indicated.  In the meantime, I recommended patient start taking Metamucil on a daily basis to improve her stool bulk and consistency, and hopefully reduce some of her urgency and frequent bowel movements.  I suggest that she start taking Bentyl 20 mg every 6 hours as needed for her abdominal pain.  We can  follow-up further on management of her symptoms following her colonoscopy  Hematochezia, likely hemorrhoidal - Fiber supplementation - Colonoscopy as above  Abdominal pain/Change in bowel habits - Bentyl as needed - Colonoscopy as above  The details, risks (including bleeding, perforation, infection, missed lesions, medication reactions and possible hospitalization or surgery if complications occur), benefits, and alternatives to colonoscopy with possible biopsy and possible polypectomy were discussed with the patient and she consents to proceed.   Jaremy Nosal E. Candis Schatz, MD [facility]   Trey Sailors, [place]

## 2021-05-05 NOTE — Patient Instructions (Signed)
If you are age 35 or older, your body mass index should be between 23-30. Your Body mass index is 49.18 kg/m. If this is out of the aforementioned range listed, please consider follow up with your Primary Care Provider.  If you are age 18 or younger, your body mass index should be between 19-25. Your Body mass index is 49.18 kg/m. If this is out of the aformentioned range listed, please consider follow up with your Primary Care Provider.   We have sent the following medications to your pharmacy for you to pick up at your convenience: Bentyl 20 mg every 6 hours as needed.  Please purchase Metamucil over the counter. Take as directed.   You have been scheduled for a colonoscopy. Please follow written instructions given to you at your visit today.  Please pick up your prep supplies at the pharmacy within the next 1-3 days. If you use inhalers (even only as needed), please bring them with you on the day of your procedure.   The [facility] providers would like to encourage you to use [facility] to communicate with providers for non-urgent requests or questions.  Due to long hold times on the telephone, sending your provider a message by [facility] may be a faster and more efficient way to get a response.  Please allow 48 business hours for a response.  Please remember that this is for non-urgent requests.   It was a pleasure to see you today!  Thank you for trusting me with your gastrointestinal care!    Scott E. Candis Schatz, MD

## 2021-05-31 ENCOUNTER — Telehealth: Payer: Self-pay | Admitting: Pulmonary Disease

## 2021-06-01 NOTE — Telephone Encounter (Signed)
Called pt and left her vm to call me to schedule hst.

## 2021-06-01 NOTE — Telephone Encounter (Signed)
Ashley Costa has pulled this order to be scheduled.

## 2021-06-06 NOTE — Telephone Encounter (Signed)
Pt had called on 12/30 and left me a vm while I was off.  Have called her back today and left her a vm .

## 2021-06-06 NOTE — Telephone Encounter (Signed)
Spoke to pt today.  She now has [place] and Earnest Bailey is working on [place].  Pt wants to wait until authorized before we schedule.  Will call her back once we know about auth.

## 2021-06-09 ENCOUNTER — Ambulatory Visit: Payer: Self-pay | Admitting: Pulmonary Disease

## 2021-06-13 NOTE — Telephone Encounter (Signed)
Pt called today and I spoke to her.  Made her aware I will call her once Ashley Costa is obtained.

## 2021-06-20 NOTE — Telephone Encounter (Signed)
Called pt & left vm for her to call me back to schedule hst.

## 2021-06-20 NOTE — Telephone Encounter (Signed)
Spoke to pt & scheduled hst.  Nothing further needed.

## 2021-06-20 NOTE — Telephone Encounter (Signed)
Authorization has been obtained & documented.  Pt is ready to be scheduled.

## 2021-06-23 ENCOUNTER — Other Ambulatory Visit: Payer: Self-pay

## 2021-06-23 ENCOUNTER — Ambulatory Visit (AMBULATORY_SURGERY_CENTER): Payer: Medicaid Other | Admitting: Gastroenterology

## 2021-06-23 ENCOUNTER — Encounter: Payer: Self-pay | Admitting: Gastroenterology

## 2021-06-23 ENCOUNTER — Ambulatory Visit (INDEPENDENT_AMBULATORY_CARE_PROVIDER_SITE_OTHER): Payer: Medicaid Other

## 2021-06-23 VITALS — BP 157/85 | HR 84 | Temp 97.3°F | Resp 26 | Ht 64.0 in | Wt 286.0 lb

## 2021-06-23 DIAGNOSIS — K640 First degree hemorrhoids: Secondary | ICD-10-CM

## 2021-06-23 DIAGNOSIS — K639 Disease of intestine, unspecified: Secondary | ICD-10-CM | POA: Diagnosis not present

## 2021-06-23 DIAGNOSIS — K921 Melena: Secondary | ICD-10-CM | POA: Diagnosis not present

## 2021-06-23 DIAGNOSIS — G4733 Obstructive sleep apnea (adult) (pediatric): Secondary | ICD-10-CM

## 2021-06-23 DIAGNOSIS — R400 Somnolence: Secondary | ICD-10-CM

## 2021-06-23 DIAGNOSIS — D123 Benign neoplasm of transverse colon: Secondary | ICD-10-CM

## 2021-06-23 DIAGNOSIS — K5730 Diverticulosis of large intestine without perforation or abscess without bleeding: Secondary | ICD-10-CM

## 2021-06-23 DIAGNOSIS — K6389 Other specified diseases of intestine: Secondary | ICD-10-CM | POA: Diagnosis not present

## 2021-06-23 MED ORDER — SODIUM CHLORIDE 0.9 % IV SOLN: 500.0000 mL | Freq: Once | INTRAVENOUS | Status: DC

## 2021-06-23 NOTE — Patient Instructions (Addendum)
Handouts provided on polyps, diverticulosis, hemorrhoids and high-fiber diet.   Continue Bentyl as needed for abdominal pain.   Recommend taking Metamucil daily to improve bowel habits and reduce hemorrhoidal bleeding.   YOU HAD AN ENDOSCOPIC PROCEDURE TODAY AT [facility]:   Refer to the procedure report that was given to you for any specific questions about what was found during the examination.  If the procedure report does not answer your questions, please call your gastroenterologist to clarify.  If you requested that your care partner not be given the details of your procedure findings, then the procedure report has been included in a sealed envelope for you to review at your convenience later.  YOU SHOULD EXPECT: Some feelings of bloating in the abdomen. Passage of more gas than usual.  Walking can help get rid of the air that was put into your GI tract during the procedure and reduce the bloating. If you had a lower endoscopy (such as a colonoscopy or flexible sigmoidoscopy) you may notice spotting of blood in your stool or on the toilet paper. If you underwent a bowel prep for your procedure, you may not have a normal bowel movement for a few days.  Please Note:  You might notice some irritation and congestion in your nose or some drainage.  This is from the oxygen used during your procedure.  There is no need for concern and it should clear up in a day or so.  SYMPTOMS TO REPORT IMMEDIATELY:  Following lower endoscopy (colonoscopy or flexible sigmoidoscopy):  Excessive amounts of blood in the stool  Significant tenderness or worsening of abdominal pains  Swelling of the abdomen that is new, acute  Fever of 100F or higher  For urgent or emergent issues, a gastroenterologist can be reached at any hour by calling [phone]. Do not use MyChart messaging for urgent concerns.    DIET:  We do recommend a small meal at first, but then you may proceed to your regular  diet.  Drink plenty of fluids but you should avoid alcoholic beverages for 24 hours.  ACTIVITY:  You should plan to take it easy for the rest of today and you should NOT DRIVE or use heavy machinery until tomorrow (because of the sedation medicines used during the test).    FOLLOW UP: Our staff will call the number listed on your records 48-72 hours following your procedure to check on you and address any questions or concerns that you may have regarding the information given to you following your procedure. If we do not reach you, we will leave a message.  We will attempt to reach you two times.  During this call, we will ask if you have developed any symptoms of COVID 19. If you develop any symptoms (ie: fever, flu-like symptoms, shortness of breath, cough etc.) before then, please call [phone].  If you test positive for Covid 19 in the 2 weeks post procedure, please call and report this information to [place].    If any biopsies were taken you will be contacted by phone or by letter within the next 1-3 weeks.  Please call us at [phone] if you have not heard about the biopsies in 3 weeks.    SIGNATURES/CONFIDENTIALITY: You and/or your care partner have signed paperwork which will be entered into your electronic medical record.  These signatures attest to the fact that that the information above on your After Visit Summary has been reviewed and is understood.  Full  responsibility of the confidentiality of this discharge information lies with you and/or your care-partner.

## 2021-06-23 NOTE — Progress Notes (Deleted)
Called to room to assist during endoscopic procedure.  Patient ID and intended procedure confirmed with present staff. Received instructions for my participation in the procedure from the performing physician.  

## 2021-06-23 NOTE — Progress Notes (Signed)
VSS, transported to PACU °

## 2021-06-23 NOTE — Progress Notes (Signed)
Called to room to assist during endoscopic procedure.  Patient ID and intended procedure confirmed with present staff. Received instructions for my participation in the procedure from the performing physician.  

## 2021-06-23 NOTE — Op Note (Signed)
[facility] Patient Name: Ashley Costa Procedure Date: 06/23/2021 8:25 AM MRN: [id number] Endoscopist: Nicki Reaper E. Candis Schatz , MD Age: 36 Referring MD:  Date of Birth: June 12, 1985 Gender: Female Account #: [id number] Procedure:                Colonoscopy Indications:              Lower abdominal pain, Hematochezia Medicines:                Monitored Anesthesia Care Procedure:                Pre-Anesthesia Assessment:                           - Prior to the procedure, a History and Physical                            was performed, and patient medications and                            allergies were reviewed. The patient's tolerance of                            previous anesthesia was also reviewed. The risks                            and benefits of the procedure and the sedation                            options and risks were discussed with the patient.                            All questions were answered, and informed consent                            was obtained. Prior Anticoagulants: The patient has                            taken no previous anticoagulant or antiplatelet                            agents. ASA Grade Assessment: III - A patient with                            severe systemic disease. After reviewing the risks                            and benefits, the patient was deemed in                            satisfactory condition to undergo the procedure.                           After obtaining informed consent, the colonoscope  was passed under direct vision. Throughout the                            procedure, the patient's blood pressure, pulse, and                            oxygen saturations were monitored continuously. The                            CF [id number] #[id number] was introduced through the anus                            and advanced to the the terminal ileum, with                            identification of  the appendiceal orifice and IC                            valve. The colonoscopy was performed without                            difficulty. The patient tolerated the procedure                            well. The quality of the bowel preparation was                            good. The terminal ileum, ileocecal valve,                            appendiceal orifice, and rectum were photographed.                            The bowel preparation used was Sutab via split dose                            instruction. Scope In: 8:43:04 AM Scope Out: 8:57:13 AM Scope Withdrawal Time: 0 hours 11 minutes 26 seconds  Total Procedure Duration: 0 hours 14 minutes 9 seconds  Findings:                 The perianal and digital rectal examinations were                            normal. Pertinent negatives include normal                            sphincter tone and no palpable rectal lesions.                           Three sessile polyps were found in the splenic                            flexure. The polyps were 3 to 4 mm in size. These  polyps were removed with a cold snare. Resection                            and retrieval were complete. Estimated blood loss                            was minimal.                           Many small and large-mouthed diverticula were found                            in the sigmoid colon and descending colon.                           A localized area of mildly inflamed mucosa was                            found in the sigmoid colon and in the descending                            colon. Biopsies were taken with a cold forceps for                            histology. Estimated blood loss was minimal.                           The exam was otherwise normal throughout the                            examined colon.                           The terminal ileum appeared normal.                           Non-bleeding internal hemorrhoids  were found during                            retroflexion. The hemorrhoids were Grade I                            (internal hemorrhoids that do not prolapse).                           No additional abnormalities were found on                            retroflexion. Complications:            No immediate complications. Estimated Blood Loss:     Estimated blood loss was minimal. Impression:               - Three 3 to 4 mm polyps at the splenic flexure,  removed with a cold snare. Resected and retrieved.                           - Diverticulosis in the sigmoid colon and in the                            descending colon.                           - Inflamed mucosa in the sigmoid colon and in the                            descending colon. Biopsied.                           - The examined portion of the ileum was normal.                           - Non-bleeding internal hemorrhoids. This is the                            source of the patient's hematochezia Recommendation:           - Patient has a contact number available for                            emergencies. The signs and symptoms of potential                            delayed complications were discussed with the                            patient. Return to normal activities tomorrow.                            Written discharge instructions were provided to the                            patient.                           - Resume previous diet.                           - Continue present medications.                           - Await pathology results.                           - Repeat colonoscopy (date not yet determined) for                            surveillance based on pathology results.                           -  Continue Bentyl as needed for abdominal pain                           - Recommend daily Metamucil to improved bowel                            habits and reduce hemorrhoidal  bleeding. [name], MD 06/23/2021 9:07:24 AM This report has been signed electronically.

## 2021-06-23 NOTE — Progress Notes (Signed)
VS by SM.

## 2021-06-23 NOTE — Progress Notes (Signed)
[facility] History and Physical   Primary Care Physician:  Patient, No Pcp Per (Inactive)   Reason for Procedure:   Hematochezia, abdominal pain  Plan:    Colonoscopy     HPI: Ashley Costa is a 36 y.o. female undergoing colonoscopy to evaluate abdominal pain and bloody bowel movements.  Abdominal pain has responded well to Bentyl.  Past Medical History:  Diagnosis Date   Endometriosis 2015   Essential hypertension    Gestational diabetes    Mixed hyperlipidemia    Morbid obesity ([place])    Obstructive sleep apnea    Pap smear abnormality of cervix    Smoker     Past Surgical History:  Procedure Laterality Date   CESAREAN SECTION N/A 11/05/2016   Procedure: CESAREAN SECTION;  Surgeon: Aletha Halim, MD;  Location: [facility];  Service: Obstetrics;  Laterality: N/A;   CESAREAN SECTION N/A 12/28/2018   Procedure: CESAREAN SECTION;  Surgeon: Osborne Oman, MD;  Location: [facility];  Service: Obstetrics;  Laterality: N/A;   TONSILECTOMY/ADENOIDECTOMY WITH MYRINGOTOMY     Wisdom teeth abstracted      Prior to Admission medications   Medication Sig Start Date End Date Taking? Authorizing Provider  dicyclomine (BENTYL) 20 MG tablet Take 1 tablet (20 mg total) by mouth every 6 (six) hours. 05/05/21   Daryel November, MD    Current Outpatient Medications  Medication Sig Dispense Refill   dicyclomine (BENTYL) 20 MG tablet Take 1 tablet (20 mg total) by mouth every 6 (six) hours. 60 tablet 1   Current Facility-Administered Medications  Medication Dose Route Frequency Provider Last Rate Last Admin   0.9 %  sodium chloride infusion  500 mL Intravenous Once Daryel November, MD        Allergies as of 06/23/2021   (No Known Allergies)    Family History  Problem Relation Age of Onset   Diabetes Mother    Hypertension Mother    Diabetes Father    Hypertension Father    Stomach cancer Neg Hx    Pancreatic cancer Neg Hx    Esophageal cancer  Neg Hx    Colon cancer Neg Hx     Social History   Socioeconomic History   Marital status: Married    Spouse name: Not on file   Number of children: 1   Years of education: Not on file   Highest education level: Not on file  Occupational History   Not on file  Tobacco Use   Smoking status: Every Day    Packs/day: 0.25    Types: Cigarettes   Smokeless tobacco: Never   Tobacco comments:    States she smokes a pack a week  Vaping Use   Vaping Use: Never used  Substance and Sexual Activity   Alcohol use: Yes    Comment: 1 drink per day   Drug use: No   Sexual activity: Not Currently    Birth control/protection: None, Implant  Other Topics Concern   Not on file  Social History Narrative   Not on file   Social Determinants of Health   Financial Resource Strain: Not on file  Food Insecurity: Not on file  Transportation Needs: Not on file  Physical Activity: Not on file  Stress: Not on file  Social Connections: Not on file  Intimate Partner Violence: Not on file    Review of Systems:  All other review of systems negative except as mentioned in the HPI.  Physical Exam:  Vital signs BP (!) 156/108    Pulse 82    Temp (!) 97.3 F (36.3 C) (Temporal)    Ht 5\' 4"  (1.626 m)    Wt 286 lb (129.7 kg)    SpO2 98%    BMI 49.09 kg/m   General:   Alert,  Well-developed, well-nourished, pleasant and cooperative in NAD Airway:  Mallampati 3 Lungs:  Clear throughout to auscultation.   Heart:  Regular rate and rhythm; no murmurs, clicks, rubs,  or gallops. Abdomen:  Soft, nontender and nondistended. Normal bowel sounds.   Neuro/Psych:  Normal mood and affect. A and O x 3   [name] E. Candis Schatz, MD [facility]

## 2021-06-26 ENCOUNTER — Telehealth: Payer: Self-pay | Admitting: Pulmonary Disease

## 2021-06-26 DIAGNOSIS — G4733 Obstructive sleep apnea (adult) (pediatric): Secondary | ICD-10-CM

## 2021-06-26 DIAGNOSIS — R400 Somnolence: Secondary | ICD-10-CM

## 2021-06-26 NOTE — Telephone Encounter (Signed)
Call patient  Sleep study result  Date of study: 06/24/2021  Impression: Mild obstructive sleep apnea Moderate oxygen desaturations  Recommendation:  Options of treatment for mild obstructive sleep apnea  CPAP therapy may be considered if patient has significant daytime symptoms that can be ascribed to be an effect of sleep disordered breathing. If CPAP therapy is chosen as option of treatment, auto titrating CPAP with pressure settings of 5-15 will be appropriate  An oral device may also be considered as an option of treatment for mild sleep disordered breathing  Watchful waiting with focus on weight loss and sleep position modification may also be appropriate if not symptomatic.  Close clinical follow up with compliance monitoring to optimize therapeutic efficiency Weight loss measures encouraged  She should be cautioned against driving when sleepy & against medications with sedative side effects

## 2021-06-27 ENCOUNTER — Telehealth: Payer: Self-pay

## 2021-06-27 NOTE — Telephone Encounter (Signed)
°  Follow up Call-  Call back number 06/23/2021  Post procedure Call Back phone  # [phone]  Permission to leave phone message Yes  Some recent data might be hidden     Patient questions:  Do you have a fever, pain , or abdominal swelling? No. Pain Score  0 *  Have you tolerated food without any problems? Yes.    Have you been able to return to your normal activities? Yes.    Do you have any questions about your discharge instructions: Diet   No. Medications  No. Follow up visit  No.  Do you have questions or concerns about your Care? No.  Actions: * If pain score is 4 or above: No action needed, pain <4.  Have you developed a fever since your procedure? no  2.   Have you had an respiratory symptoms (SOB or cough) since your procedure? no  3.   Have you tested positive for COVID 19 since your procedure no  4.   Have you had any family members/close contacts diagnosed with the COVID 19 since your procedure?  no   If yes to any of these questions please route to Joylene John, RN and Joella Prince, RN

## 2021-06-28 NOTE — Telephone Encounter (Signed)
I called the patient and gave her the results per Dr. Jenetta Downer and she voices understanding. I have placed the order. Nothing further needed.

## 2021-07-05 ENCOUNTER — Encounter: Payer: Self-pay | Admitting: Gastroenterology

## 2021-07-24 ENCOUNTER — Ambulatory Visit: Payer: Self-pay | Admitting: Pulmonary Disease

## 2021-08-02 ENCOUNTER — Encounter: Payer: Self-pay | Admitting: Obstetrics

## 2021-08-02 ENCOUNTER — Other Ambulatory Visit: Payer: Self-pay

## 2021-08-02 ENCOUNTER — Ambulatory Visit (INDEPENDENT_AMBULATORY_CARE_PROVIDER_SITE_OTHER): Payer: Medicaid Other | Admitting: Obstetrics

## 2021-08-02 VITALS — BP 168/95 | HR 76 | Ht 64.0 in | Wt 298.0 lb

## 2021-08-02 DIAGNOSIS — Z3046 Encounter for surveillance of implantable subdermal contraceptive: Secondary | ICD-10-CM | POA: Diagnosis not present

## 2021-08-02 DIAGNOSIS — J301 Allergic rhinitis due to pollen: Secondary | ICD-10-CM

## 2021-08-02 MED ORDER — LORATADINE 10 MG PO TABS: 10.0000 mg | ORAL_TABLET | Freq: Every day | ORAL | 11 refills | Status: AC

## 2021-08-02 NOTE — Addendum Note (Signed)
Addended by: Baltazar Najjar A on: 08/02/2021 09:52 AM ? ? Modules accepted: Orders ? ?

## 2021-08-02 NOTE — Progress Notes (Signed)
GYN presents for Nexplanon removal due to ?Excessive weight gain and not being able to lose weight.  ?

## 2021-08-02 NOTE — Progress Notes (Signed)
[facility] REMOVAL NOTE ? ?Date of LMP:   unknown ? ?Contraception used: *Nexplanon ? ? ?Indications:  The patient desires contraception.  She understands risks, benefits, and alternatives to Implanon and would like to proceed. ? ?Anesthesia:   Lidocaine 1% plain. ? ?Procedure:  A time-out was performed confirming the procedure and the patient's allergy status.  Complications: None ?                     The rod was palpated and the area was sterilely prepped.  The area beneath the distal tip was anesthetized with 1% xylocaine and the skin incised                       Over the tip and the tip was exposed, grasped with forcep and removed intact.  Steri strip  ?                     And a bandage applied and the arm was wrapped with gauze bandage.  The patient tolerated well.  ?Instructions:  The patient was instructed to remove the dressing in 24 hours and that some bruising is to be expected.  She was advised to use over the counter analgesics as needed for any pain at the site.  She is to keep the area dry for 24 hours and to call if her hand or arm becomes cold, numb, or blue. ? ?Return visit:  Return in 2 weeks  ? ?Shelly Bombard, MD ?08/02/2021 8:28 AM  ?

## 2021-08-10 ENCOUNTER — Telehealth: Payer: Self-pay | Admitting: Pulmonary Disease

## 2021-08-11 NOTE — Telephone Encounter (Signed)
Will close encounter

## 2021-08-16 ENCOUNTER — Other Ambulatory Visit: Payer: Self-pay

## 2021-08-16 ENCOUNTER — Ambulatory Visit (INDEPENDENT_AMBULATORY_CARE_PROVIDER_SITE_OTHER): Payer: Medicaid Other | Admitting: Obstetrics

## 2021-08-16 ENCOUNTER — Encounter: Payer: Self-pay | Admitting: Obstetrics

## 2021-08-16 VITALS — BP 143/85 | HR 85 | Ht 64.0 in | Wt 301.0 lb

## 2021-08-16 DIAGNOSIS — Z3009 Encounter for other general counseling and advice on contraception: Secondary | ICD-10-CM | POA: Diagnosis not present

## 2021-08-16 DIAGNOSIS — Z9889 Other specified postprocedural states: Secondary | ICD-10-CM

## 2021-08-16 DIAGNOSIS — R87613 High grade squamous intraepithelial lesion on cytologic smear of cervix (HGSIL): Secondary | ICD-10-CM

## 2021-08-16 DIAGNOSIS — E66813 Obesity, class 3: Secondary | ICD-10-CM

## 2021-08-16 DIAGNOSIS — F17200 Nicotine dependence, unspecified, uncomplicated: Secondary | ICD-10-CM

## 2021-08-16 NOTE — Progress Notes (Signed)
? ?Subjective: ? ? ?  ?  ? Ashley Costa is a 36 y.o. female here for a postoperative check after Nexplanon removal  Current complaints: None.   ? ? ?Gynecologic History ?No LMP recorded. Patient has had an implant. ?Contraception: none ?Last Pap: 2020. Results were: HGSIL ?Last mammogram: n/a. Results were: n/a ? ?Obstetric History ?OB History  ?Gravida Para Term Preterm AB Living  ?'2 1 1     1  '$ ?SAB IAB Ectopic Multiple Live Births  ?      0 1  ?  ?# Outcome Date GA Lbr Len/2nd Weight Sex Delivery Anes PTL Lv  ?2 Gravida           ?1 Term 11/05/16 [redacted]w[redacted]d 6 lb 4.2 oz (2.84 kg) F CS-LTranv EPI  LIV  ?   Birth Comments: post term, slightly pale, term infant with light meconium in creases  ? ? ?Past Medical History:  ?Diagnosis Date  ? Endometriosis 2015  ? Essential hypertension   ? Gestational diabetes   ? Mixed hyperlipidemia   ? [place]   ? Obstructive sleep apnea   ? Pap smear abnormality of cervix   ? Smoker   ?  ?Past Surgical History:  ?Procedure Laterality Date  ? CESAREAN SECTION N/A 11/05/2016  ? Procedure: CESAREAN SECTION;  Surgeon: PAletha Halim MD;  Location[place] [facility]  Service: Obstetrics;  Laterality: N/A;  ? CESAREAN SECTION N/A 12/28/2018  ? Procedure: CESAREAN SECTION;  Surgeon: AOsborne Oman MD;  [facility]: [facility];  Service: Obstetrics;  Laterality: N/A;  ? TONSILECTOMY/ADENOIDECTOMY WITH MYRINGOTOMY    ? Wisdom teeth abstracted    ?  ? ?Current Outpatient Medications:  ?  amLODipine (NORVASC) 2.5 MG tablet, 1 tab(s) (Patient not taking: Reported on 08/02/2021), Disp: , Rfl:  ?  dicyclomine (BENTYL) 20 MG tablet, Take 1 tablet (20 mg total) by mouth every 6 (six) hours. (Patient not taking: Reported on 08/02/2021), Disp: 60 tablet, Rfl: 1 ?  loratadine (CLARITIN) 10 MG tablet, Take 1 tablet (10 mg total) by mouth daily., Disp: 30 tablet, Rfl: 11 ?No Known Allergies  ?Social History  ? ?Tobacco Use  ? Smoking status: Every Day  ?  Packs/day: 0.25  ?  Types: Cigarettes   ? Smokeless tobacco: Never  ? Tobacco comments:  ?  States she smokes a pack a week  ?Substance Use Topics  ? Alcohol use: Yes  ?  Comment: 1 drink per day  ?  ?Family History  ?Problem Relation Age of Onset  ? Diabetes Mother   ? Hypertension Mother   ? Diabetes Father   ? Hypertension Father   ? Stomach cancer Neg Hx   ? Pancreatic cancer Neg Hx   ? Esophageal cancer Neg Hx   ? Colon cancer Neg Hx   ?  ? ? ?Review of Systems ? ?Constitutional: negative for fatigue and weight loss ?Respiratory: negative for cough and wheezing ?Cardiovascular: negative for chest pain, fatigue and palpitations ?Gastrointestinal: negative for abdominal pain and change in bowel habits ?Musculoskeletal:negative for myalgias ?Neurological: negative for gait problems and tremors ?Behavioral/Psych: negative for abusive relationship, depression ?Endocrine: negative for temperature intolerance    ?Genitourinary:negative for abnormal menstrual periods, genital lesions, hot flashes, sexual problems and vaginal discharge ?Integument/breast: negative for breast lump, breast tenderness, nipple discharge and skin lesion(s) ? ?  ?Objective:  ? ?    ?BP (!) 143/85   Pulse 85   Ht '5\' 4"'$  (1.626 m)  Wt (!) 301 lb (136.5 kg)   BMI 51.67 kg/m?  ?General:   Alert and no distress  ?Skin:   no rash or abnormalities  ?Lungs:   clear to auscultation bilaterally  ?Heart:   regular rate and rhythm, S1, S2 normal, no murmur, click, rub or gallop  ?Breasts:   normal without suspicious masses, skin or nipple changes or axillary nodes  ?Abdomen:  normal findings: no organomegaly, soft, non-tender and no hernia  ?Pelvis:  External genitalia: normal general appearance ?Urinary system: urethral meatus normal and bladder without fullness, nontender ?Vaginal: normal without tenderness, induration or masses ?Cervix: normal appearance ?Adnexa: normal bimanual exam ?Uterus: anteverted and non-tender, normal size  ?Left Upper Extremity:  Nexplanon removal site is  clean, dry and intact.  Non tender. ? ? ?Lab Review ?Urine pregnancy test ?Labs reviewed yes ?Radiologic studies reviewed no ? ?I have spent a total of 20 minutes of face-to-face time, excluding clinical staff time, reviewing notes and preparing to see patient, ordering tests and/or medications, and counseling the patient.  ? ?Assessment:  ? ? 1. Postoperative state ?- doing well s/p Nexplanon Removal ? ?2. HGSIL (high grade squamous intraepithelial lesion) on Pap smear of cervix ?- Colposcopy scheduled ? ?3. Class 3 severe obesity due to excess calories without serious comorbidity with body mass index (BMI) of 50.0 to 59.9 in adult [facility]) ?- weight reduction with the aid of dietary changes, possible bariatric surgery, exercise and behavioral modification recommended ? ?4. Encounter for other general counseling and advice on contraception ?- denies contraception ?- condoms recommended for STI prevention ? ?5. Tobacco dependence ?- cessation recommended with the aid of medication and behavioral modification ? ?  ?Plan:  ? ? Education reviewed: calcium supplements, depression evaluation, low fat, low cholesterol diet, safe sex/STD prevention, self breast exams, smoking cessation, and weight bearing exercise. ?Contraception: none. ?Follow up in: 1 month.  Colposcopy for HGSIL pap smear ? ? ? Shelly Bombard, MD ?08/16/2021 10:05 AM  ?

## 2021-08-16 NOTE — Progress Notes (Signed)
Follow up Nexplanon removal. No problems. No contraceptives at present, reports abstinence. ?

## 2021-08-18 ENCOUNTER — Telehealth: Payer: Self-pay | Admitting: Pulmonary Disease

## 2021-08-18 DIAGNOSIS — R400 Somnolence: Secondary | ICD-10-CM

## 2021-08-18 NOTE — Telephone Encounter (Signed)
Called and spoke with patient. She verbalized understanding. She wanted to know how long would it take to get scheduled for the study. Her son is scheduled for a bone marrow transplant in [place] on April 17th. She will not be back in [place] until late July.  ? ?PCCs, can you please advise on the wait time for an in lab study? Thanks!  ?

## 2021-08-18 NOTE — Telephone Encounter (Signed)
I called and left a message per DPR that she would need to have an in lab study and per her insurance a home sleep test is not covered. She was asked to call back with any questions.  ?

## 2021-08-18 NOTE — Telephone Encounter (Signed)
Patient is returning phone call. Patient phone number is [phone]. ?

## 2021-08-18 NOTE — Telephone Encounter (Signed)
Order in lab polysomnogram-split-night study ? ?This is in place of home sleep study that was denied by insurance ?

## 2021-08-21 NOTE — Telephone Encounter (Signed)
I called the sleep lab and left a message as soon as I hear back I will let you know ?

## 2021-08-25 NOTE — Telephone Encounter (Signed)
She is scheduled for 4/6.  ?

## 2021-08-31 ENCOUNTER — Ambulatory Visit: Payer: Self-pay | Admitting: Pulmonary Disease

## 2021-09-07 ENCOUNTER — Encounter (HOSPITAL_BASED_OUTPATIENT_CLINIC_OR_DEPARTMENT_OTHER): Payer: Medicaid Other | Admitting: Pulmonary Disease

## 2021-09-08 ENCOUNTER — Ambulatory Visit (INDEPENDENT_AMBULATORY_CARE_PROVIDER_SITE_OTHER): Payer: Medicaid Other | Admitting: Pulmonary Disease

## 2021-09-08 ENCOUNTER — Encounter: Payer: Self-pay | Admitting: Pulmonary Disease

## 2021-09-08 VITALS — BP 130/90 | HR 90 | Temp 98.1°F | Ht 64.0 in | Wt 300.8 lb

## 2021-09-08 DIAGNOSIS — Z9989 Dependence on other enabling machines and devices: Secondary | ICD-10-CM | POA: Diagnosis not present

## 2021-09-08 DIAGNOSIS — G4733 Obstructive sleep apnea (adult) (pediatric): Secondary | ICD-10-CM

## 2021-09-08 NOTE — Patient Instructions (Signed)
Continue using CPAP on a nightly basis ? ?As you continue to benefit from CPAP use with improved symptoms ?I do not see any benefit to repeating the study that we will only confirm that you have sleep apnea and not change the treatment that you are already benefiting from ? ?I will see you back in about 3 months ? ?Call us with any significant concerns ? ?Good luck with your son's treatments ? ? ?

## 2021-09-08 NOTE — Progress Notes (Signed)
? ?      ?Ashley Costa    [id number]    10-27-1985 ? ?Primary Care Physician:Patient, No Pcp Per (Inactive) ? ?Referring Physician: No referring provider defined for this encounter. ? ?Chief complaint:   ?Follow-up for obstructive sleep apnea ? ?HPI: ? ?Patient diagnosed with obstructive sleep apnea following a home sleep study ? ?Has been using CPAP therapy ? ?Describes significant improvement in daytime symptoms ?Describes improvement in sleep quality ?Daytime energy levels are much better ? ?She did bring up an issue about insurance coverage requesting that she has an in lab study performed as the home sleep study is not covered ? ?It will be very wasteful to have him go to the sleep lab to have a study done when we already have a diagnosis and sleep study result in the sleep lab will not change current treatment. ?Patient already on treatment and describes significant improvement in symptoms with treatment ? ?Usually goes to bed between about 830 and 9, sleeps about 6:45 AM ?Usually able to fall asleep immediately ?2-3 awakenings ? ?Weight is stable compared to recent visit ? ?Dad snored ? ?She was having a dry mouth, sore throat before treatment started ?Memory is fair ? ?Active smoker 1 pack a week ? ?Outpatient Encounter Medications as of 09/08/2021  ?Medication Sig  ? amLODipine (NORVASC) 2.5 MG tablet   ? dicyclomine (BENTYL) 20 MG tablet Take 1 tablet (20 mg total) by mouth every 6 (six) hours.  ? loratadine (CLARITIN) 10 MG tablet Take 1 tablet (10 mg total) by mouth daily.  ? ?No facility-administered encounter medications on file as of 09/08/2021.  ? ? ?Allergies as of 09/08/2021  ? (No Known Allergies)  ? ? ?Past Medical History:  ?Diagnosis Date  ? Endometriosis 2015  ? Essential hypertension   ? Gestational diabetes   ? Mixed hyperlipidemia   ? Morbid obesity ([place])   ? Obstructive sleep apnea   ? Pap smear abnormality of cervix   ? Smoker   ? ? ?Past Surgical History:  ?Procedure Laterality Date   ? CESAREAN SECTION N/A 11/05/2016  ? Procedure: CESAREAN SECTION;  Surgeon: Aletha Halim, MD;  Location: [facility];  Service: Obstetrics;  Laterality: N/A;  ? CESAREAN SECTION N/A 12/28/2018  ? Procedure: CESAREAN SECTION;  Surgeon: Osborne Oman, MD;  Location: [facility];  Service: Obstetrics;  Laterality: N/A;  ? TONSILECTOMY/ADENOIDECTOMY WITH MYRINGOTOMY    ? Wisdom teeth abstracted    ? ? ?Family History  ?Problem Relation Age of Onset  ? Diabetes Mother   ? Hypertension Mother   ? Diabetes Father   ? Hypertension Father   ? Stomach cancer Neg Hx   ? Pancreatic cancer Neg Hx   ? Esophageal cancer Neg Hx   ? Colon cancer Neg Hx   ? ? ?Social History  ? ?Socioeconomic History  ? Marital status: Married  ?  Spouse name: Not on file  ? Number of children: 1  ? Years of education: Not on file  ? Highest education level: Not on file  ?Occupational History  ? Not on file  ?Tobacco Use  ? Smoking status: Every Day  ?  Packs/day: 0.25  ?  Types: Cigarettes  ? Smokeless tobacco: Never  ? Tobacco comments:  ?  States she smokes a pack a week  ?Vaping Use  ? Vaping Use: Never used  ?Substance and Sexual Activity  ? Alcohol use: Yes  ?  Comment: 1 drink per day  ?  Drug use: No  ? Sexual activity: Not Currently  ?  Birth control/protection: Implant  ?Other Topics Concern  ? Not on file  ?Social History Narrative  ? Not on file  ? ?Social Determinants of Health  ? ?Financial Resource Strain: Not on file  ?Food Insecurity: Not on file  ?Transportation Needs: Not on file  ?Physical Activity: Not on file  ?Stress: Not on file  ?Social Connections: Not on file  ?Intimate Partner Violence: Not on file  ? ? ?Review of Systems  ?Constitutional:  Positive for fatigue.  ?Respiratory:  Negative for shortness of breath.   ?Psychiatric/Behavioral:  Positive for sleep disturbance.   ? ?Vitals:  ? 09/08/21 0944  ?BP: 130/90  ?Pulse: 90  ?Temp: 98.1 ?F (36.7 ?C)  ?SpO2: 98%  ? ? ? ?Physical Exam ?Constitutional:   ?    Appearance: She is obese.  ?HENT:  ?   Head: Normocephalic.  ?   Mouth/Throat:  ?   Mouth: Mucous membranes are moist.  ?   Comments: Macroglossia ?Eyes:  ?   Pupils: Pupils are equal, round, and reactive to light.  ?Cardiovascular:  ?   Rate and Rhythm: Normal rate and regular rhythm.  ?   Heart sounds: No murmur heard. ?  No friction rub.  ?Pulmonary:  ?   Effort: No respiratory distress.  ?   Breath sounds: No stridor. No wheezing or rhonchi.  ?Musculoskeletal:  ?   Cervical back: No rigidity or tenderness.  ?Neurological:  ?   Mental Status: She is alert.  ?Psychiatric:     ?   Mood and Affect: Mood normal.  ? ? ? ?  03/09/2021  ?  9:00 AM  ?Results of the Epworth flowsheet  ?Sitting and reading 2  ?Watching TV 1  ?Sitting, inactive in a public place (e.g. a theatre or a meeting) 1  ?As a passenger in a car for an hour without a break 2  ?Lying down to rest in the afternoon when circumstances permit 3  ?Sitting and talking to someone 0  ?Sitting quietly after a lunch without alcohol 1  ?In a car, while stopped for a few minutes in traffic 0  ?Total score 10  ? ? ?Data Reviewed: ?No previous study on record ? ?Assessment:  ?Mild obstructive sleep apnea ?-On CPAP therapy ?-Describes significant improvement in daytime symptoms ?-Describes significant improvement in sleep quality ? ?Daytime symptoms is better ? ?Obesity ? ?Encouraged to continue using CPAP nightly active smoker ? ?Plan/Recommendations: ?Smoking cessation counseling ? ?Schedule patient for a home sleep study ? ?Weight loss efforts encouraged ? ?Tentative follow-up in about 6 months ? ?An in lab sleep study will not yield any additional significant information that will help in patient management, she is being optimally managed for obstructive sleep apnea with significant improvement in sleep quality and daytime symptoms ?Encouraged to continue using CPAP ? ? ?Sherrilyn Rist MD ?[facility] ?09/08/2021, 10:01 AM ? ?CC: No ref.  provider found ? ? ? ?

## 2021-09-13 ENCOUNTER — Encounter: Payer: Medicaid Other | Admitting: Obstetrics

## 2022-01-01 ENCOUNTER — Ambulatory Visit: Payer: Medicaid Other | Admitting: Adult Health

## 2022-03-08 ENCOUNTER — Encounter: Payer: Self-pay | Admitting: Obstetrics and Gynecology

## 2022-03-08 ENCOUNTER — Other Ambulatory Visit (HOSPITAL_COMMUNITY)
Admission: RE | Admit: 2022-03-08 | Discharge: 2022-03-08 | Disposition: A | Discharge status: Home / Self Care | Payer: Medicaid Other | Source: Ambulatory Visit | Attending: Obstetrics and Gynecology | Admitting: Obstetrics and Gynecology

## 2022-03-08 ENCOUNTER — Ambulatory Visit (INDEPENDENT_AMBULATORY_CARE_PROVIDER_SITE_OTHER): Payer: Medicaid Other | Admitting: Obstetrics and Gynecology

## 2022-03-08 VITALS — BP 155/83 | HR 85 | Ht 60.0 in | Wt 305.0 lb

## 2022-03-08 DIAGNOSIS — Z01411 Encounter for gynecological examination (general) (routine) with abnormal findings: Secondary | ICD-10-CM

## 2022-03-08 DIAGNOSIS — R102 Pelvic and perineal pain: Secondary | ICD-10-CM | POA: Insufficient documentation

## 2022-03-08 NOTE — Progress Notes (Signed)
Subjective:     Ashley Costa is a 36 y.o. female P1 with BMI 44 who is here for a comprehensive physical exam. The patient reports pelvic pain which has worsened over the past month. She describes the pain as intermittent cramping which she rates as 12/10. She states the pain starts randomly throughout the day and is often debilitating and interferes with her daily activities such as getting her kids ready for school. She also reports that urination and bowel movements have started to trigger the pain in the past 2 weeks. The pain is located in the suprapubic area and minimally relieved by tylenol and ibuprofen. She is not sexually active. She denies any abnormal discharge.   Past Medical History:  Diagnosis Date   Endometriosis 2015   Essential hypertension    Gestational diabetes    Mixed hyperlipidemia    Morbid obesity ([place])    Obstructive sleep apnea    Pap smear abnormality of cervix    Smoker    Past Surgical History:  Procedure Laterality Date   CESAREAN SECTION N/A 11/05/2016   Procedure: CESAREAN SECTION;  Surgeon: Aletha Halim, MD;  Location: [facility];  Service: Obstetrics;  Laterality: N/A;   CESAREAN SECTION N/A 12/28/2018   Procedure: CESAREAN SECTION;  Surgeon: Osborne Oman, MD;  Location: [facility];  Service: Obstetrics;  Laterality: N/A;   TONSILECTOMY/ADENOIDECTOMY WITH MYRINGOTOMY     Wisdom teeth abstracted     Family History  Problem Relation Age of Onset   Diabetes Mother    Hypertension Mother    Diabetes Father    Hypertension Father    Stomach cancer Neg Hx    Pancreatic cancer Neg Hx    Esophageal cancer Neg Hx    Colon cancer Neg Hx     Social History   Socioeconomic History   Marital status: Married    Spouse name: Not on file   Number of children: 1   Years of education: Not on file   Highest education level: Not on file  Occupational History   Not on file  Tobacco Use   Smoking status: Every Day    Packs/day: 0.25     Types: Cigarettes   Smokeless tobacco: Never   Tobacco comments:    States she smokes a pack a week  Vaping Use   Vaping Use: Never used  Substance and Sexual Activity   Alcohol use: Yes    Comment: 1 drink per day   Drug use: No   Sexual activity: Not Currently    Birth control/protection: Implant, None  Other Topics Concern   Not on file  Social History Narrative   Not on file   Social Determinants of Health   Financial Resource Strain: Low Risk  (12/17/2018)   Overall Financial Resource Strain (CARDIA)    Difficulty of Paying Living Expenses: Not hard at all  Food Insecurity: No Food Insecurity (12/17/2018)   Hunger Vital Sign    Worried About Running Out of Food in the Last Year: Never true    Ran Out of Food in the Last Year: Never true  Transportation Needs: Unknown (12/17/2018)   PRAPARE - Hydrologist (Medical): No    Lack of Transportation (Non-Medical): Not on file  Physical Activity: Not on file  Stress: No Stress Concern Present (12/17/2018)   [place]    Feeling of Stress : Only a little  Social Connections: Not  on file  Intimate Partner Violence: Not At Risk (12/17/2018)   Humiliation, Afraid, Rape, and Kick questionnaire    Fear of Current or Ex-Partner: No    Emotionally Abused: No    Physically Abused: No    Sexually Abused: No   Health Maintenance  Topic Date Due   COVID-19 Vaccine (1) Never done   PAP SMEAR-Modifier  06/13/2021   INFLUENZA VACCINE  01/02/2022   COLONOSCOPY (Pts 45-76yr Insurance coverage will need to be confirmed)  06/23/2026   TETANUS/TDAP  10/08/2028   Hepatitis C Screening  Completed   HIV Screening  Completed   HPV VACCINES  Aged Out       Review of Systems Pertinent items noted in HPI and remainder of comprehensive ROS otherwise negative.   Objective:  Blood pressure (!) 155/83, pulse 85, height 5' (1.524 m), weight (!) 305 lb  (138.3 kg), last menstrual period 09/13/2021, currently breastfeeding.   GENERAL: Well-developed, well-nourished female in no acute distress.  HEENT: Normocephalic, atraumatic. Sclerae anicteric.  NECK: Supple. Normal thyroid.  LUNGS: Clear to auscultation bilaterally.  HEART: Regular rate and rhythm. BREASTS: Symmetric in size. No palpable masses or lymphadenopathy, skin changes, or nipple drainage. ABDOMEN: Soft, nontender, nondistended. No organomegaly. PELVIC: Normal external female genitalia. Vagina is pink and rugated.  Normal discharge. Normal appearing cervix. Uterus is normal in size. No adnexal mass. Mild suprapubic tenderness. Chaperone present during the pelvic exam EXTREMITIES: No cyanosis, clubbing, or edema, 2+ distal pulses.     Assessment:    Healthy female exam.      Plan:    Pap smear collected Discussed different etiology of her pelvic pain - Pelvic ultrasound ordered - vaginal swab collected for yeast and BV. Patient declined STI testing - Urine culture Patient will be contacted with abnormal results Follow up as scheduled with PCP for HTN management  See After Visit Summary for Counseling Recommendations

## 2022-03-08 NOTE — Progress Notes (Signed)
36 y.o GYN presents for AEX/PAP.  C/o constant pelvic/abdominal pain 11-12/10 x 4 days.  It is painful to have a BM, urinate.

## 2022-03-09 ENCOUNTER — Emergency Department (HOSPITAL_COMMUNITY): Payer: Medicaid Other

## 2022-03-09 ENCOUNTER — Other Ambulatory Visit: Payer: Self-pay

## 2022-03-09 ENCOUNTER — Encounter (HOSPITAL_COMMUNITY): Payer: Self-pay

## 2022-03-09 ENCOUNTER — Emergency Department (HOSPITAL_COMMUNITY)
Admission: EM | Admit: 2022-03-09 | Discharge: 2022-03-09 | Disposition: A | Discharge status: Home / Self Care | Payer: Medicaid Other | Attending: Emergency Medicine | Admitting: Emergency Medicine

## 2022-03-09 DIAGNOSIS — K5732 Diverticulitis of large intestine without perforation or abscess without bleeding: Secondary | ICD-10-CM

## 2022-03-09 DIAGNOSIS — R1030 Lower abdominal pain, unspecified: Secondary | ICD-10-CM | POA: Insufficient documentation

## 2022-03-09 DIAGNOSIS — R102 Pelvic and perineal pain: Secondary | ICD-10-CM | POA: Diagnosis present

## 2022-03-09 LAB — COMPREHENSIVE METABOLIC PANEL
ALT: 21 U/L (ref 0–44)
AST: 19 U/L (ref 15–41)
Albumin: 3.8 g/dL (ref 3.5–5.0)
Alkaline Phosphatase: 55 U/L (ref 38–126)
Anion gap: 12 (ref 5–15)
BUN: 10 mg/dL (ref 6–20)
CO2: 25 mmol/L (ref 22–32)
Calcium: 9.3 mg/dL (ref 8.9–10.3)
Chloride: 104 mmol/L (ref 98–111)
Creatinine, Ser: 1.34 mg/dL — ABNORMAL HIGH (ref 0.44–1.00)
GFR, Estimated: 53 mL/min — ABNORMAL LOW (ref >60)
Glucose, Bld: 89 mg/dL (ref 70–99)
Potassium: 4.3 mmol/L (ref 3.5–5.1)
Sodium: 141 mmol/L (ref 135–145)
Total Bilirubin: 0.5 mg/dL (ref 0.3–1.2)
Total Protein: 7.2 g/dL (ref 6.5–8.1)

## 2022-03-09 LAB — CBC WITH DIFFERENTIAL/PLATELET
Abs Immature Granulocytes: 0.03 10*3/uL (ref 0.00–0.07)
Basophils Absolute: 0 10*3/uL (ref 0.0–0.1)
Basophils Relative: 0 %
Eosinophils Absolute: 0.1 10*3/uL (ref 0.0–0.5)
Eosinophils Relative: 1 %
HCT: 40 % (ref 36.0–46.0)
Hemoglobin: 13 g/dL (ref 12.0–15.0)
Immature Granulocytes: 0 %
Lymphocytes Relative: 18 %
Lymphs Abs: 2.2 10*3/uL (ref 0.7–4.0)
MCH: 26.9 pg (ref 26.0–34.0)
MCHC: 32.5 g/dL (ref 30.0–36.0)
MCV: 82.6 fL (ref 80.0–100.0)
Monocytes Absolute: 1.4 10*3/uL — ABNORMAL HIGH (ref 0.1–1.0)
Monocytes Relative: 11 %
Neutro Abs: 8.9 10*3/uL — ABNORMAL HIGH (ref 1.7–7.7)
Neutrophils Relative %: 70 %
Platelets: 371 10*3/uL (ref 150–400)
RBC: 4.84 MIL/uL (ref 3.87–5.11)
RDW: 13.8 % (ref 11.5–15.5)
WBC: 12.6 10*3/uL — ABNORMAL HIGH (ref 4.0–10.5)
nRBC: 0 % (ref 0.0–0.2)

## 2022-03-09 LAB — URINALYSIS, ROUTINE W REFLEX MICROSCOPIC
Bilirubin Urine: NEGATIVE
Glucose, UA: NEGATIVE mg/dL
Hgb urine dipstick: NEGATIVE
Ketones, ur: NEGATIVE mg/dL
Leukocytes,Ua: NEGATIVE
Nitrite: NEGATIVE
Protein, ur: NEGATIVE mg/dL
Specific Gravity, Urine: 1.013 (ref 1.005–1.030)
pH: 6 (ref 5.0–8.0)

## 2022-03-09 LAB — I-STAT BETA HCG BLOOD, ED (MC, WL, AP ONLY): I-stat hCG, quantitative: <5 m[IU]/mL (ref <5)

## 2022-03-09 LAB — CERVICOVAGINAL ANCILLARY ONLY
Bacterial Vaginitis (gardnerella): POSITIVE — AB
Candida Glabrata: NEGATIVE
Candida Vaginitis: NEGATIVE
Comment: NEGATIVE
Comment: NEGATIVE
Comment: NEGATIVE

## 2022-03-09 MED ORDER — OXYCODONE-ACETAMINOPHEN 5-325 MG PO TABS: 1.0000 | ORAL_TABLET | Freq: Four times a day (QID) | ORAL | 0 refills | Status: AC | PRN

## 2022-03-09 MED ORDER — AMOXICILLIN-POT CLAVULANATE 875-125 MG PO TABS: 1.0000 | ORAL_TABLET | Freq: Two times a day (BID) | ORAL | 0 refills | Status: DC

## 2022-03-09 MED ORDER — OXYCODONE-ACETAMINOPHEN 5-325 MG PO TABS
2.0000 | ORAL_TABLET | Freq: Once | ORAL | Status: AC
  Administered 2022-03-09: 2 via ORAL
  Filled 2022-03-09: qty 2

## 2022-03-09 NOTE — ED Triage Notes (Signed)
Patient reports went to GYN yesterday due to pelvic pain and urgency to void but can't and reports they did a pap smear and nothing else.  Reports she can only void if she is bearing down.  Patient is tearful in triage.  Reports it is the worse pain she has ever felt and feels like something is exploding inside of her.

## 2022-03-09 NOTE — ED Provider Triage Note (Signed)
Emergency Medicine Provider Triage Evaluation Note  Ashley Costa , a 36 y.o. female  was evaluated in triage.  Pt complains of intermittent suprapubic pain.  Occurs at random, lasts for a few seconds to minutes, and that disappears.  Saw OB/GYN yesterday, and stated "all they did was a Pap smear and then nothing".  States the pain is 10/10 when it occurs, enough to "bring me to my knees".  Currently without the pain.  States the pain occurs when she is trying to urinate, and causes her to stop midstream.  Denies blood in the urine or stool.  Known Hx of IBS and chronic loose stools.  Denies constipation, chest pain, shortness of breath, fever, chills.  Unknown LMP.  Denies vaginal bleeding, discharge, or pain.  Review of Systems  Positive:  Negative: See above  Physical Exam  BP (!) 173/99   Pulse 92   Temp 98.5 F (36.9 C) (Oral)   Resp 20   Ht 5' (1.524 m)   Wt (!) 138.3 kg   LMP 09/13/2021 (Exact Date)   SpO2 100%   BMI 59.57 kg/m  Gen:   Awake, no distress, tearful Resp:  Normal effort  MSK:   Moves extremities without difficulty  Other:  Mild suprapubic tenderness  Medical Decision Making  Medically screening exam initiated at 1:20 PM.  Appropriate orders placed.  Ashley Costa was informed that the remainder of the evaluation will be completed by another provider, this initial triage assessment does not replace that evaluation, and the importance of remaining in the ED until their evaluation is complete.     Prince Rome, PA-C 71/69/67 1323

## 2022-03-09 NOTE — ED Provider Notes (Signed)
[facility] EMERGENCY DEPARTMENT Provider Note   CSN: [id number] Arrival date & time: 03/09/22  1239     History  Chief Complaint  Patient presents with   Pelvic Pain    Ashley Costa is a 36 y.o. female.  36 year old female presents with intermittent suprapubic pain x4 days.  Patient notes that when she starts to pee her stream stops.  Denies any associated flank pain.  No fever or chills.  No nausea vomiting or diarrhea.  Denies any vaginal bleeding or discharge.  Saw her OB for similar symptoms yesterday and had a pelvic exam including a wet prep which according to the patient came back negative.  Patient denies being sexually active.  No treatment use prior to arrival       Home Medications Prior to Admission medications   Medication Sig Start Date End Date Taking? Authorizing Provider  amLODipine (NORVASC) 2.5 MG tablet     [provider]  dicyclomine (BENTYL) 20 MG tablet Take 1 tablet (20 mg total) by mouth every 6 (six) hours. 05/05/21   Daryel November, MD  loratadine (CLARITIN) 10 MG tablet Take 1 tablet (10 mg total) by mouth daily. 08/02/21   Shelly Bombard, MD      Allergies    Patient has no known allergies.    Review of Systems   Review of Systems  All other systems reviewed and are negative.   Physical Exam Updated Vital Signs BP (!) 143/81   Pulse 81   Temp 98.5 F (36.9 C) (Oral)   Resp (!) 22   Ht 1.524 m (5')   Wt (!) 138.3 kg   LMP 09/13/2021 (Exact Date)   SpO2 97%   BMI 59.57 kg/m  Physical Exam Vitals and nursing note reviewed.  Constitutional:      General: She is not in acute distress.    Appearance: Normal appearance. She is well-developed. She is not toxic-appearing.  HENT:     Head: Normocephalic and atraumatic.  Eyes:     General: Lids are normal.     Conjunctiva/sclera: Conjunctivae normal.     Pupils: Pupils are equal, round, and reactive to light.  Neck:     Thyroid: No thyroid mass.      Trachea: No tracheal deviation.  Cardiovascular:     Rate and Rhythm: Normal rate and regular rhythm.     Heart sounds: Normal heart sounds. No murmur heard.    No gallop.  Pulmonary:     Effort: Pulmonary effort is normal. No respiratory distress.     Breath sounds: Normal breath sounds. No stridor. No decreased breath sounds, wheezing, rhonchi or rales.  Abdominal:     General: There is no distension.     Palpations: Abdomen is soft.     Tenderness: There is abdominal tenderness in the suprapubic area. There is no rebound.  Musculoskeletal:        General: No tenderness. Normal range of motion.     Cervical back: Normal range of motion and neck supple.  Skin:    General: Skin is warm and dry.     Findings: No abrasion or rash.  Neurological:     Mental Status: She is alert and oriented to person, place, and time. Mental status is at baseline.     GCS: GCS eye subscore is 4. GCS verbal subscore is 5. GCS motor subscore is 6.     Cranial Nerves: No cranial nerve deficit.     Sensory:  No sensory deficit.     Motor: Motor function is intact.  Psychiatric:        Attention and Perception: Attention normal.        Speech: Speech normal.        Behavior: Behavior normal.     ED Results / Procedures / Treatments   Labs (all labs ordered are listed, but only abnormal results are displayed) Labs Reviewed  CBC WITH DIFFERENTIAL/PLATELET - Abnormal; Notable for the following components:      Result Value   WBC 12.6 (*)    Neutro Abs 8.9 (*)    Monocytes Absolute 1.4 (*)    All other components within normal limits  COMPREHENSIVE METABOLIC PANEL - Abnormal; Notable for the following components:   Creatinine, Ser 1.34 (*)    GFR, Estimated 53 (*)    All other components within normal limits  URINALYSIS, ROUTINE W REFLEX MICROSCOPIC  I-STAT BETA HCG BLOOD, ED ([facility], [facility], [facility] ONLY)    EKG None  Radiology No results found.  Procedures Procedures    Medications Ordered in  ED Medications - No data to display  ED Course/ Medical Decision Making/ A&P                           Medical Decision Making Amount and/or Complexity of Data Reviewed Radiology: ordered.   Patient presented with lower abdominal discomfort.  Patient had recent pelvic exam with negative work-up there.  Concern for possible intra-abdominal pathology.  Abdominal CT performed per my review interpretation showed sigmoid diverticulitis.  Urinalysis was without infection.  Patient to be placed on antibiotics and return precautions given.  She has no evidence of a surgical abdomen at this time        Final Clinical Impression(s) / ED Diagnoses Final diagnoses:  None    Rx / DC Orders ED Discharge Orders     None         Lacretia Leigh, MD 03/09/22 Einar Crow

## 2022-03-10 LAB — URINE CULTURE

## 2022-03-12 LAB — CYTOLOGY - PAP
Adequacy: ABSENT
Comment: NEGATIVE
Diagnosis: NEGATIVE
High risk HPV: NEGATIVE

## 2022-03-12 MED ORDER — METRONIDAZOLE 500 MG PO TABS: 500.0000 mg | ORAL_TABLET | Freq: Two times a day (BID) | ORAL | 0 refills | Status: DC

## 2022-03-12 NOTE — Addendum Note (Signed)
Addended by: Mora Bellman on: 03/12/2022 11:15 AM   Modules accepted: Orders

## 2022-03-19 ENCOUNTER — Other Ambulatory Visit: Payer: Self-pay

## 2022-03-19 DIAGNOSIS — N760 Acute vaginitis: Secondary | ICD-10-CM

## 2022-03-19 MED ORDER — METRONIDAZOLE 500 MG PO TABS: 500.0000 mg | ORAL_TABLET | Freq: Two times a day (BID) | ORAL | 0 refills | Status: DC

## 2022-04-06 ENCOUNTER — Telehealth: Payer: Self-pay | Admitting: *Deleted

## 2022-04-06 NOTE — Telephone Encounter (Signed)
Returned pt TC regarding appt for colpo. Pap WNL. Dr. Elly Modena consulted. Pt does not need colpo. Pt advised. Pt verbalized understanding. Appt 04/10/22 cancelled.

## 2022-04-10 ENCOUNTER — Encounter: Payer: Medicaid Other | Admitting: Obstetrics and Gynecology

## 2022-04-20 ENCOUNTER — Ambulatory Visit (HOSPITAL_COMMUNITY)
Admission: EM | Admit: 2022-04-20 | Discharge: 2022-04-20 | Disposition: A | Discharge status: Home / Self Care | Payer: Medicaid Other | Attending: Emergency Medicine | Admitting: Emergency Medicine

## 2022-04-20 ENCOUNTER — Telehealth: Payer: Self-pay | Admitting: Gastroenterology

## 2022-04-20 ENCOUNTER — Encounter (HOSPITAL_COMMUNITY): Payer: Self-pay | Admitting: Emergency Medicine

## 2022-04-20 ENCOUNTER — Ambulatory Visit: Payer: Medicaid Other | Admitting: Gastroenterology

## 2022-04-20 DIAGNOSIS — B349 Viral infection, unspecified: Secondary | ICD-10-CM | POA: Diagnosis not present

## 2022-04-20 DIAGNOSIS — Z79899 Other long term (current) drug therapy: Secondary | ICD-10-CM | POA: Diagnosis not present

## 2022-04-20 DIAGNOSIS — J101 Influenza due to other identified influenza virus with other respiratory manifestations: Secondary | ICD-10-CM

## 2022-04-20 DIAGNOSIS — Z1152 Encounter for screening for COVID-19: Secondary | ICD-10-CM | POA: Diagnosis not present

## 2022-04-20 LAB — POC INFLUENZA A AND B ANTIGEN (URGENT CARE ONLY)
INFLUENZA A ANTIGEN, POC: POSITIVE — AB
INFLUENZA B ANTIGEN, POC: NEGATIVE

## 2022-04-20 LAB — SARS CORONAVIRUS 2 (TAT 6-24 HRS): SARS Coronavirus 2: NEGATIVE

## 2022-04-20 MED ORDER — ACETAMINOPHEN 325 MG PO TABS
ORAL_TABLET | ORAL | Status: AC
  Filled 2022-04-20: qty 3

## 2022-04-20 MED ORDER — PROMETHAZINE-DM 6.25-15 MG/5ML PO SYRP: 5.0000 mL | ORAL_SOLUTION | Freq: Every evening | ORAL | 0 refills | Status: AC

## 2022-04-20 MED ORDER — OSELTAMIVIR PHOSPHATE 75 MG PO CAPS: 75.0000 mg | ORAL_CAPSULE | Freq: Two times a day (BID) | ORAL | 0 refills | Status: AC

## 2022-04-20 MED ORDER — BENZONATATE 100 MG PO CAPS: 100.0000 mg | ORAL_CAPSULE | Freq: Three times a day (TID) | ORAL | 0 refills | Status: AC

## 2022-04-20 MED ORDER — ALBUTEROL SULFATE HFA 108 (90 BASE) MCG/ACT IN AERS: 2.0000 | INHALATION_SPRAY | Freq: Four times a day (QID) | RESPIRATORY_TRACT | 0 refills | Status: DC | PRN

## 2022-04-20 MED ORDER — ACETAMINOPHEN 325 MG PO TABS
975.0000 mg | ORAL_TABLET | Freq: Once | ORAL | Status: AC
  Administered 2022-04-20: 975 mg via ORAL

## 2022-04-20 NOTE — ED Provider Notes (Signed)
[place]    CSN: [id number] Arrival date & time: 04/20/22  0808      History   Chief Complaint Chief Complaint  Patient presents with   Cough   Chills    HPI Ashley Costa is a 36 y.o. female.  Patient presents complaining of productive cough, chills, headache, diarrhea, and generalized body aches x 2 days.  Patient reports that headache is worsened by cough.  Patient reports 3 episodes of watery stools today.  Patient denies any nausea or vomiting.  Patient denies any recent exposure to anyone with a viral illness that she is aware of.  Patient has taken an over-the-counter "cold and flu syrup" with minimal relief of symptoms.  History of bronchitis per patient statement.    Cough Associated symptoms: chills, fever, myalgias, rhinorrhea and shortness of breath (Intermittent)   Associated symptoms: no chest pain, no ear pain, no sore throat and no wheezing     Past Medical History:  Diagnosis Date   Endometriosis 2015   Essential hypertension    Gestational diabetes    Mixed hyperlipidemia    Morbid obesity ([place])    Obstructive sleep apnea    Pap smear abnormality of cervix    Smoker     Patient Active Problem List   Diagnosis Date Noted   Nexplanon in place 12/30/2018   HSIL (high grade squamous intraepithelial lesion) on Pap smear of cervix 10/23/2018   Gestational diabetes mellitus (GDM) in third trimester controlled on oral hypoglycemic drug 10/23/2018   History of primary cesarean section 10/09/2018   Status post primary low transverse cesarean section 11/08/2016   Maternal morbid obesity, antepartum ([place]) 09/04/2016   Tobacco abuse 08/06/2012   Obesity, Class III, BMI 40-49.9 (morbid obesity) ([place]) 08/06/2012    Past Surgical History:  Procedure Laterality Date   CESAREAN SECTION N/A 11/05/2016   Procedure: CESAREAN SECTION;  Surgeon: Aletha Halim, MD;  Location: [facility];  Service: Obstetrics;  Laterality: N/A;   CESAREAN  SECTION N/A 12/28/2018   Procedure: CESAREAN SECTION;  Surgeon: Osborne Oman, MD;  [facility];  Service: Obstetrics;  Laterality: N/A;   TONSILECTOMY/ADENOIDECTOMY WITH MYRINGOTOMY     Wisdom teeth abstracted      OB History     Gravida  2   Para  1   Term  1   Preterm      AB      Living  1      SAB      IAB      Ectopic      Multiple  0   Live Births  1            Home Medications    Prior to Admission medications   Medication Sig Start Date End Date Taking? Authorizing Provider  albuterol (VENTOLIN HFA) 108 (90 Base) MCG/ACT inhaler Inhale 2 puffs into the lungs every 6 (six) hours as needed for wheezing or shortness of breath. 04/20/22  Yes Flossie Dibble, NP  benzonatate (TESSALON) 100 MG capsule Take 1 capsule (100 mg total) by mouth every 8 (eight) hours. 04/20/22  Yes Flossie Dibble, NP  promethazine-dextromethorphan (PROMETHAZINE-DM) 6.25-15 MG/5ML syrup Take 5 mLs by mouth at bedtime. 04/20/22  Yes Flossie Dibble, NP  amLODipine (NORVASC) 2.5 MG tablet Take 2.5 mg by mouth every evening.    [provider]  dicyclomine (BENTYL) 20 MG tablet Take 1 tablet (20 mg total) by mouth every 6 (six) hours.  Patient taking differently: Take 20 mg by mouth daily as needed for spasms. 05/05/21   Daryel November, MD  loratadine (CLARITIN) 10 MG tablet Take 1 tablet (10 mg total) by mouth daily. Patient taking differently: Take 10 mg by mouth daily as needed for allergies. 08/02/21   Shelly Bombard, MD  oxyCODONE-acetaminophen (PERCOCET/ROXICET) 5-325 MG tablet Take 1 tablet by mouth every 6 (six) hours as needed for severe pain. 03/09/22   Lacretia Leigh, MD    Family History Family History  Problem Relation Age of Onset   Diabetes Mother    Hypertension Mother    Diabetes Father    Hypertension Father    Stomach cancer Neg Hx    Pancreatic cancer Neg Hx    Esophageal cancer Neg Hx    Colon cancer Neg Hx     Social  History Social History   Tobacco Use   Smoking status: Every Day    Packs/day: 0.25    Types: Cigarettes   Smokeless tobacco: Never   Tobacco comments:    States she smokes a pack a week  Vaping Use   Vaping Use: Never used  Substance Use Topics   Alcohol use: Yes    Comment: 1 drink per day   Drug use: No     Allergies   Patient has no known allergies.   Review of Systems Review of Systems  Constitutional:  Positive for activity change, appetite change, chills, fatigue and fever.  HENT:  Positive for rhinorrhea. Negative for congestion, ear discharge, ear pain, postnasal drip, sinus pressure, sinus pain, sore throat, trouble swallowing and voice change.   Eyes: Negative.   Respiratory:  Positive for cough and shortness of breath (Intermittent). Negative for apnea, chest tightness, wheezing and stridor.   Cardiovascular:  Negative for chest pain and palpitations.  Gastrointestinal:  Positive for diarrhea. Negative for abdominal distention, abdominal pain, constipation, nausea and vomiting.  Musculoskeletal:  Positive for myalgias.     Physical Exam Triage Vital Signs ED Triage Vitals [04/20/22 0836]  Enc Vitals Group     BP 124/61     Pulse Rate 99     Resp 19     Temp 99.5 F (37.5 C)     Temp Source Oral     SpO2 97 %     Weight      Height      Head Circumference      Peak Flow      Pain Score 10     Pain Loc      Pain Edu?      Excl. in [place]?    No data found.  Updated Vital Signs BP 124/61 (BP Location: Left Arm)   Pulse 99   Temp 99.5 F (37.5 C) (Oral)   Resp 19   SpO2 97%     Physical Exam Vitals and nursing note reviewed.  Constitutional:      Appearance: Normal appearance.  HENT:     Right Ear: Hearing, tympanic membrane, ear canal and external ear normal.     Left Ear: Hearing, tympanic membrane, ear canal and external ear normal.     Nose: Rhinorrhea present. No congestion. Rhinorrhea is clear.     Right Turbinates: Swollen. Not  pale.     Left Turbinates: Not enlarged, swollen or pale.     Right Sinus: No maxillary sinus tenderness or frontal sinus tenderness.     Left Sinus: No maxillary sinus tenderness or frontal sinus tenderness.  Mouth/Throat:     Lips: Pink.     Mouth: Mucous membranes are moist.     Pharynx: Oropharynx is clear. No pharyngeal swelling, oropharyngeal exudate, posterior oropharyngeal erythema or uvula swelling.     Tonsils: No tonsillar exudate or tonsillar abscesses. 0 on the right. 0 on the left.  Cardiovascular:     Rate and Rhythm: Normal rate and regular rhythm.     Heart sounds: Normal heart sounds, S1 normal and S2 normal.  Pulmonary:     Effort: Pulmonary effort is normal.     Breath sounds: Examination of the right-middle field reveals decreased breath sounds. Examination of the left-middle field reveals decreased breath sounds. Examination of the right-lower field reveals decreased breath sounds. Examination of the left-lower field reveals decreased breath sounds. Decreased breath sounds present. No wheezing, rhonchi or rales.  Lymphadenopathy:     Cervical: No cervical adenopathy.  Neurological:     Mental Status: She is alert.      UC Treatments / Results  Labs (all labs ordered are listed, but only abnormal results are displayed) Labs Reviewed  SARS CORONAVIRUS 2 (TAT 6-24 HRS)  POC INFLUENZA A AND B ANTIGEN (URGENT CARE ONLY)    EKG   Radiology No results found.  Procedures Procedures (including critical care time)  Medications Ordered in UC Medications  acetaminophen (TYLENOL) tablet 975 mg (975 mg Oral Given 04/20/22 0951)    Initial Impression / Assessment and Plan / UC Course  I have reviewed the triage vital signs and the nursing notes.  Pertinent labs & imaging results that were available during my care of the patient were reviewed by me and considered in my medical decision making (see chart for details).     Patient was evaluated for Influenza  A.  Patient was given Tylenol in office.  Point-of-care Influenza was positive for influenza A.  COVID test is pending.  Tamiflu was prescribed. Tessalon, Promethazine DM, and albuterol sent to the pharmacy for symptom management.  Patient made aware of symptom management for viral illness.  Patient made aware of results reporting protocol and MyChart.  Patient was given a work note.  Patient was made aware of red flag symptoms that would warrant an emergency department visit. Patient verbalized understanding of instructions.  Final Clinical Impressions(s) / UC Diagnoses   Final diagnoses:  Viral illness     Discharge Instructions      I am treating you for a viral illness today.  Viral illnesses usually takes 7 to 10 days to resolve.  Using over-the-counter medications can help treat the symptoms.  You may rotate Tylenol and ibuprofen every 4-6 hours.  For example, take Tylenol now and then 4 to 6 hours later take ibuprofen.  You can use Tylenol for fever and moderate pain, you can take this medication every 4-6 hours, please do not take more than 3000 mg in a 24-hour day.  You can take ibuprofen every 6 hours, do not take more than 2400 mg in a 24-hour day.  I advised that you do not take ibuprofen on an empty stomach, ibuprofen can cause GI problems such as GI bleeding. Maintaining hydration status is very important, please drink at least 8 cups of water daily.  Please try to intake nutrient dense meals.   Albuterol has been sent to the pharmacy, he can use this every 6 hours as needed for shortness of breath.  Tessalon has been sent to the pharmacy, you can use this medication for cough,  you can take this medication every 8 hours as needed.  Promethazine DM has been sent to the pharmacy, you can take this medication at bedtime, this medication helps with cough, please do not operate any heavy machinery or drive a car after taking this medication.   We will call you if your COVID test results are  positive, you can view these test results on MyChart.    ED Prescriptions     Medication Sig Dispense Auth. Provider   albuterol (VENTOLIN HFA) 108 (90 Base) MCG/ACT inhaler Inhale 2 puffs into the lungs every 6 (six) hours as needed for wheezing or shortness of breath. 6.7 g Flossie Dibble, NP   benzonatate (TESSALON) 100 MG capsule Take 1 capsule (100 mg total) by mouth every 8 (eight) hours. 16 capsule Flossie Dibble, NP   promethazine-dextromethorphan (PROMETHAZINE-DM) 6.25-15 MG/5ML syrup Take 5 mLs by mouth at bedtime. 35 mL Flossie Dibble, NP      PDMP not reviewed this encounter.   Flossie Dibble, NP 04/20/22 1158

## 2022-04-20 NOTE — ED Triage Notes (Signed)
Pt reports for past 48 hours having cough, chills, headache with cough, diarrhea, body aches.

## 2022-04-20 NOTE — Telephone Encounter (Signed)
Good Morning Dr. Candis Schatz,  Patient called stating that she has an appointment with you this morning at 9:50 and would not be able to make it in time.   Patient was rescheduled for 06/05/22 at 8:30.

## 2022-04-20 NOTE — Discharge Instructions (Addendum)
Viral illnesses usually takes 7 to 10 days to resolve.  Using over-the-counter medications can help treat the symptoms.  You may rotate Tylenol and ibuprofen every 4-6 hours.  For example, take Tylenol now and then 4 to 6 hours later take ibuprofen.  You can use Tylenol for fever and moderate pain, you can take this medication every 4-6 hours, please do not take more than 3000 mg in a 24-hour day.  You can take ibuprofen every 6 hours, do not take more than 2400 mg in a 24-hour day.  I advised that you do not take ibuprofen on an empty stomach, ibuprofen can cause GI problems such as GI bleeding. Maintaining hydration status is very important, please drink at least 8 cups of water daily.  Please try to intake nutrient dense meals.   Tamiflu has been sent to the pharmacy, this medication 2 times daily for the next 5 days, separate each dose by 12 hours.   Albuterol has been sent to the pharmacy, he can use this every 6 hours as needed for shortness of breath.  Tessalon has been sent to the pharmacy, you can use this medication for cough, you can take this medication every 8 hours as needed.  Promethazine DM has been sent to the pharmacy, you can take this medication at bedtime, this medication helps with cough, please do not operate any heavy machinery or drive a car after taking this medication.

## 2022-06-05 ENCOUNTER — Encounter: Payer: Self-pay | Admitting: Gastroenterology

## 2022-06-05 ENCOUNTER — Ambulatory Visit (INDEPENDENT_AMBULATORY_CARE_PROVIDER_SITE_OTHER): Payer: Medicaid Other | Admitting: Gastroenterology

## 2022-06-05 VITALS — BP 118/68 | HR 71 | Ht 64.0 in | Wt 307.0 lb

## 2022-06-05 DIAGNOSIS — K5792 Diverticulitis of intestine, part unspecified, without perforation or abscess without bleeding: Secondary | ICD-10-CM

## 2022-06-05 MED ORDER — DICYCLOMINE HCL 20 MG PO TABS: 20.0000 mg | ORAL_TABLET | Freq: Four times a day (QID) | ORAL | 3 refills | Status: DC

## 2022-06-05 NOTE — Progress Notes (Signed)
HPI : Ashley Costa is a very pleasant 37 year old female with history of morbid obesity, sleep apnea, endometriosis and hypertension who I initially saw in December 2022 with complaints of chronic abdominal pain and recent hematochezia.  The patient underwent a colonoscopy June 23, 2021 which was notable for left-sided diverticulosis with peridiverticular erythema, 3 small tubular adenomas in the splenic flexure and internal hemorrhoids.  She was prescribed Bentyl and Metamucil.  She was recommended repeat colonoscopy in 5 years. She presented to the emergency department October 6 with several days of lower abdominal pain.  A CT scan was consistent with uncomplicated sigmoid diverticulitis.  She was prescribed antibiotics and sent home.  She continued to have abdominal pain for several days.    Currently she has occasional bouts of mild abdominal pain.  She is no longer having urgency with defecation.  Stools are typically formed.  Usually 2-3 times per day. She is out of Bentyl.  She has been taking Fiber pills since her episode of diverticulitis.  She presents today for further education on diverticulitis and recommendations on how to prevent further episodes.   Colonoscopy June 23, 2021 Indication: Hematochezia Diverticulosis in sigmoid and descending colon with associated erythema, biopsied 3 polyps in the splenic flexure (tubular adenomas)   Past Medical History:  Diagnosis Date   Endometriosis 2015   Essential hypertension    Gestational diabetes    Mixed hyperlipidemia    Morbid obesity ([place])    Obstructive sleep apnea    Pap smear abnormality of cervix    Smoker      Past Surgical History:  Procedure Laterality Date   CESAREAN SECTION N/A 11/05/2016   Procedure: CESAREAN SECTION;  Surgeon: Aletha Halim, MD;  Location: [facility];  Service: Obstetrics;  Laterality: N/A;   CESAREAN SECTION N/A 12/28/2018   Procedure: CESAREAN SECTION;  Surgeon: Osborne Oman, MD;  Location: [facility];  Service: Obstetrics;  Laterality: N/A;   TONSILECTOMY/ADENOIDECTOMY WITH MYRINGOTOMY     Wisdom teeth abstracted     Family History  Problem Relation Age of Onset   Diabetes Mother    Hypertension Mother    Diabetes Father    Hypertension Father    Stomach cancer Neg Hx    Pancreatic cancer Neg Hx    Esophageal cancer Neg Hx    Colon cancer Neg Hx    Social History   Tobacco Use   Smoking status: Every Day    Packs/day: 0.25    Types: Cigarettes   Smokeless tobacco: Never   Tobacco comments:    States she smokes a pack a week  Vaping Use   Vaping Use: Never used  Substance Use Topics   Alcohol use: Yes    Comment: 1 drink per day   Drug use: No   Current Outpatient Medications  Medication Sig Dispense Refill   albuterol (VENTOLIN HFA) 108 (90 Base) MCG/ACT inhaler Inhale 2 puffs into the lungs every 6 (six) hours as needed for wheezing or shortness of breath. 6.7 g 0   amLODipine (NORVASC) 2.5 MG tablet Take 2.5 mg by mouth every evening.     benzonatate (TESSALON) 100 MG capsule Take 1 capsule (100 mg total) by mouth every 8 (eight) hours. 16 capsule 0   dicyclomine (BENTYL) 20 MG tablet Take 1 tablet (20 mg total) by mouth every 6 (six) hours. (Patient taking differently: Take 20 mg by mouth daily as needed for spasms.) 60 tablet 1   loratadine (CLARITIN) 10  MG tablet Take 1 tablet (10 mg total) by mouth daily. (Patient taking differently: Take 10 mg by mouth daily as needed for allergies.) 30 tablet 11   oseltamivir (TAMIFLU) 75 MG capsule Take 1 capsule (75 mg total) by mouth every 12 (twelve) hours. 10 capsule 0   oxyCODONE-acetaminophen (PERCOCET/ROXICET) 5-325 MG tablet Take 1 tablet by mouth every 6 (six) hours as needed for severe pain. 15 tablet 0   promethazine-dextromethorphan (PROMETHAZINE-DM) 6.25-15 MG/5ML syrup Take 5 mLs by mouth at bedtime. 35 mL 0   No current facility-administered medications for this visit.   No  Known Allergies   Review of Systems: All systems reviewed and negative except where noted in HPI.    No results found.  Physical Exam: BP 118/68   Pulse 71   Ht '5\' 4"'$  (1.626 m)   Wt (!) 307 lb (139.3 kg)   SpO2 98%   BMI 52.70 kg/m  Abdominal: Soft, nondistended, nontender. Bowel sounds active throughout. There are no masses palpable. No hepatomegaly. Extremities: no edema Neurological: Alert and oriented to person place and time. Skin: Skin is warm and dry. No rashes noted. Psychiatric: Normal mood and affect. Behavior is normal.  CBC    Component Value Date/Time   WBC 12.6 (H) 03/09/2022 1348   RBC 4.84 03/09/2022 1348   HGB 13.0 03/09/2022 1348   HGB 12.3 10/09/2018 0931   HCT 40.0 03/09/2022 1348   HCT 37.4 10/09/2018 0931   PLT 371 03/09/2022 1348   PLT 383 10/09/2018 0931   MCV 82.6 03/09/2022 1348   MCV 84 10/09/2018 0931   MCH 26.9 03/09/2022 1348   MCHC 32.5 03/09/2022 1348   RDW 13.8 03/09/2022 1348   RDW 14.8 10/09/2018 0931   LYMPHSABS 2.2 03/09/2022 1348   LYMPHSABS 2.8 06/13/2018 1107   MONOABS 1.4 (H) 03/09/2022 1348   EOSABS 0.1 03/09/2022 1348   EOSABS 0.1 06/13/2018 1107   BASOSABS 0.0 03/09/2022 1348   BASOSABS 0.0 06/13/2018 1107    CMP     Component Value Date/Time   NA 141 03/09/2022 1348   K 4.3 03/09/2022 1348   CL 104 03/09/2022 1348   CO2 25 03/09/2022 1348   GLUCOSE 89 03/09/2022 1348   BUN 10 03/09/2022 1348   CREATININE 1.34 (H) 03/09/2022 1348   CREATININE 0.99 08/21/2012 1431   CALCIUM 9.3 03/09/2022 1348   PROT 7.2 03/09/2022 1348   ALBUMIN 3.8 03/09/2022 1348   AST 19 03/09/2022 1348   ALT 21 03/09/2022 1348   ALKPHOS 55 03/09/2022 1348   BILITOT 0.5 03/09/2022 1348   GFRNONAA 53 (L) 03/09/2022 1348   GFRAA >60 12/30/2018 0612     ASSESSMENT AND PLAN: 37 year old female with recent diagnosis of uncomplicated sigmoid diverticulitis.  She had had a colonoscopy 9 months prior to her episode of diverticulitis  demonstrating left-sided diverticulosis and peridiverticular erythema.  No need for repeat colonoscopy given colonoscopy within the past year.  We discussed the anatomy and pathophysiology of diverticulosis and diverticular complications.  Recommended she take Metamucil every day and follow a high-fiber diet.  In addition, strongly urged that she stop smoking, as this may be the most modifiable risk factor for recurrent diverticulitis.  Also recommended she limit use of NSAIDs, and use Tylenol preferentially as needed for musculoskeletal pain.  She was advised that there is no evidence to support the recommendation to avoid seeds, nuts or popcorn.  She can continue using Bentyl as needed for crampy abdominal pain.   We  also discussed segmental colitis associated with diverticula (scad) and the lack of good data to support its clinical significance and management.  Currently, she does not have chronic symptoms significant enough to warrant any additional therapy.  Sigmoid diverticulitis, uncomplicated - Daily Metamucil - No need for repeat colonoscopy (will be due for surveillance colonoscopy in January 2028) -- Stop smoking -- Continue PRN Bentyl  Zhanae Proffit E. Candis Schatz, MD [facility]  No ref. provider found

## 2022-06-05 NOTE — Patient Instructions (Signed)
_______________________________________________________  If you are age 37 or older, your body mass index should be between 23-30. Your Body mass index is 52.7 kg/m. If this is out of the aforementioned range listed, please consider follow up with your Primary Care Provider.  If you are age 64 or younger, your body mass index should be between 19-25. Your Body mass index is 52.7 kg/m. If this is out of the aformentioned range listed, please consider follow up with your Primary Care Provider.   Continue Bentyl every 6 hours as needed.  Continue Fiber supplement/ High fiber diet.   The [facility] providers would like to encourage you to use [facility] to communicate with providers for non-urgent requests or questions.  Due to long hold times on the telephone, sending your provider a message by [facility] may be a faster and more efficient way to get a response.  Please allow 48 business hours for a response.  Please remember that this is for non-urgent requests.   It was a pleasure to see you today!  Thank you for trusting me with your gastrointestinal care!    Scott E.Candis Schatz, MD

## 2022-06-19 ENCOUNTER — Telehealth: Payer: Self-pay | Admitting: Gastroenterology

## 2022-06-19 NOTE — Telephone Encounter (Signed)
Pt states she is having abdominal pain again and had blood in her stool yesterday. Pt scheduled to see Vicie Mutters PA tomorrow at 1:30pm. Pt aware of appt.

## 2022-06-19 NOTE — Telephone Encounter (Signed)
Inbound call from patients stating she is experiencing abdominal pain and also blood in stool. Patient is inuring if a medication can be prescribed. Please give a call to further advise.  Thank you

## 2022-06-20 ENCOUNTER — Other Ambulatory Visit (INDEPENDENT_AMBULATORY_CARE_PROVIDER_SITE_OTHER): Payer: Medicaid Other

## 2022-06-20 ENCOUNTER — Ambulatory Visit (INDEPENDENT_AMBULATORY_CARE_PROVIDER_SITE_OTHER): Payer: Medicaid Other | Admitting: Physician Assistant

## 2022-06-20 ENCOUNTER — Encounter: Payer: Self-pay | Admitting: Physician Assistant

## 2022-06-20 VITALS — BP 126/70 | HR 60 | Ht 64.0 in | Wt 302.0 lb

## 2022-06-20 DIAGNOSIS — D123 Benign neoplasm of transverse colon: Secondary | ICD-10-CM

## 2022-06-20 DIAGNOSIS — K5792 Diverticulitis of intestine, part unspecified, without perforation or abscess without bleeding: Secondary | ICD-10-CM

## 2022-06-20 DIAGNOSIS — R1030 Lower abdominal pain, unspecified: Secondary | ICD-10-CM | POA: Diagnosis not present

## 2022-06-20 DIAGNOSIS — K921 Melena: Secondary | ICD-10-CM

## 2022-06-20 LAB — CBC WITH DIFFERENTIAL/PLATELET
Basophils Absolute: 0.1 10*3/uL (ref 0.0–0.1)
Basophils Relative: 0.6 % (ref 0.0–3.0)
Eosinophils Absolute: 0.1 10*3/uL (ref 0.0–0.7)
Eosinophils Relative: 1.3 % (ref 0.0–5.0)
HCT: 36.9 % (ref 36.0–46.0)
Hemoglobin: 12.4 g/dL (ref 12.0–15.0)
Lymphocytes Relative: 31.1 % (ref 12.0–46.0)
Lymphs Abs: 3.4 10*3/uL (ref 0.7–4.0)
MCHC: 33.7 g/dL (ref 30.0–36.0)
MCV: 80.6 fl (ref 78.0–100.0)
Monocytes Absolute: 1 10*3/uL (ref 0.1–1.0)
Monocytes Relative: 8.9 % (ref 3.0–12.0)
Neutro Abs: 6.3 10*3/uL (ref 1.4–7.7)
Neutrophils Relative %: 58.1 % (ref 43.0–77.0)
Platelets: 377 10*3/uL (ref 150.0–400.0)
RBC: 4.58 Mil/uL (ref 3.87–5.11)
RDW: 14.7 % (ref 11.5–15.5)
WBC: 10.9 10*3/uL — ABNORMAL HIGH (ref 4.0–10.5)

## 2022-06-20 LAB — COMPREHENSIVE METABOLIC PANEL
ALT: 17 U/L (ref 0–35)
AST: 15 U/L (ref 0–37)
Albumin: 4.3 g/dL (ref 3.5–5.2)
Alkaline Phosphatase: 58 U/L (ref 39–117)
BUN: 9 mg/dL (ref 6–23)
CO2: 29 mEq/L (ref 19–32)
Calcium: 9.2 mg/dL (ref 8.4–10.5)
Chloride: 104 mEq/L (ref 96–112)
Creatinine, Ser: 1.02 mg/dL (ref 0.40–1.20)
GFR: 70.76 mL/min (ref >60.00)
Glucose, Bld: 82 mg/dL (ref 70–99)
Potassium: 3.6 mEq/L (ref 3.5–5.1)
Sodium: 140 mEq/L (ref 135–145)
Total Bilirubin: 0.4 mg/dL (ref 0.2–1.2)
Total Protein: 7.8 g/dL (ref 6.0–8.3)

## 2022-06-20 LAB — SEDIMENTATION RATE: Sed Rate: 27 mm/hr — ABNORMAL HIGH (ref 0–20)

## 2022-06-20 MED ORDER — HYDROCORTISONE (PERIANAL) 2.5 % EX CREA: 1.0000 | TOPICAL_CREAM | Freq: Two times a day (BID) | CUTANEOUS | 2 refills | Status: AC

## 2022-06-20 MED ORDER — HYOSCYAMINE SULFATE 0.125 MG PO TABS: 0.1250 mg | ORAL_TABLET | Freq: Four times a day (QID) | ORAL | 0 refills | Status: DC | PRN

## 2022-06-20 NOTE — Progress Notes (Signed)
06/20/2022 Ashley Costa [id number] [birth year]  Referring provider: No ref. provider found Primary GI doctor: Dr. Candis Schatz  ASSESSMENT AND PLAN:  Lower abdominal pain left lower Quadrant with history of diverticulitis in 03/2022 Similar to pain she had in Oct mild AB pain on examination, no rebound tenderness, fever, chills.  Diverticulitis versus IBS Pending results can send in Augmentin, if repeated episode, can consider surgical referral Dicyclomine not helping, given levsin, can do heating pad and liquid diet, FODMAP given -Continue fiber - get CBC, CMET, and sed rate. -Will schedule for CT AB and pelvis with contrast to evaluate further - ER precautions discussed with the patient -close follow up  Hematochezia -Sitz baths, increase fiber, increase water -external cream sent in.  -We discussed hemorrhoid banding here in the office for internal hemorrhoids if not improving with conservative treatment. - follow up for evaluation here in the office.   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  Recall 5 years suggested   Morbid obesity  Body mass index is 51.84 kg/m.  -Patient has been advised to make an attempt to improve diet and exercise patterns to aid in weight loss. -Recommended diet heavy in fruits and veggies and low in animal meats, cheeses, and dairy products, appropriate calorie intake   Patient Care Team: Patient, No Pcp Per as PCP - General (General Practice)  HISTORY OF PRESENT ILLNESS: 37 y.o. female with a past medical history of morbid obesity, sleep apnea, endometriosis, hypertension, diverticulosis, personal history of tubular adenomatous polyps and others listed below presents for evaluation of abdominal pain and blood in stool.  06/23/2021  colonoscopy for abdominal pain and hematochezia showed left-sided diverticulosis with peridiverticular erythema, 3 small tubular adenomatous polyps and splenic flexure and internal hemorrhoids.  Recall 5 years suggested Bentyl and Metamucil. 03/09/2022 ER  lower abdominal discomfort with CT  consistent with uncomplicated sigmoid diverticulitis  Normally has daily formed Bm's.  She states Monday she had BM 3-4 x, wiping BM and had large volume blood, had some mucus with the stool.  After that episode she had dull achy pain, having smaller Bm's, incomplete BM.  No rectal pain, itching, burning.  She has been having pain "episodes", tylenol helps a little, lying down helps the most.  She has been taking dicyclomine 3 x a day since Monday and it is not helping.  Had some nausea this AM, no vomiting.  Denies fever, chills.  She has started to try to change her diet, she is not eating meat, she is eating oatmeal  She  reports that she has been smoking cigarettes. She has been smoking an average of .25 packs per day. She has never used smokeless tobacco. She reports current alcohol use. She reports that she does not use drugs.  Colonoscopy June 23, 2021 Indication: Hematochezia Diverticulosis in sigmoid and descending colon with associated erythema, biopsied 3 polyps in the splenic flexure (tubular adenomas  Current Medications:    Current Outpatient Medications (Cardiovascular):    amLODipine (NORVASC) 2.5 MG tablet, Take 2.5 mg by mouth every evening. (Patient not taking: Reported on 06/20/2022)  Current Outpatient Medications (Respiratory):    albuterol (VENTOLIN HFA) 108 (90 Base) MCG/ACT inhaler, Inhale 2 puffs into the lungs every 6 (six) hours as needed for wheezing or shortness of breath.   loratadine (CLARITIN) 10 MG tablet, Take 1 tablet (10  mg total) by mouth daily. (Patient taking differently: Take 10 mg by mouth daily as needed for allergies.)   benzonatate (TESSALON) 100 MG  capsule, Take 1 capsule (100 mg total) by mouth every 8 (eight) hours. (Patient not taking: Reported on 06/20/2022)   promethazine-dextromethorphan (PROMETHAZINE-DM) 6.25-15 MG/5ML syrup, Take 5 mLs by mouth at bedtime. (Patient not taking: Reported on 06/20/2022)  Current Outpatient Medications (Analgesics):    oxyCODONE-acetaminophen (PERCOCET/ROXICET) 5-325 MG tablet, Take 1 tablet by mouth every 6 (six) hours as needed for severe pain. (Patient not taking: Reported on 06/20/2022)   Current Outpatient Medications (Other):    dicyclomine (BENTYL) 20 MG tablet, Take 1 tablet (20 mg total) by mouth every 6 (six) hours.   hyoscyamine (LEVSIN) 0.125 MG tablet, Take 1 tablet (0.125 mg total) by mouth every 6 (six) hours as needed for cramping (diarrhea,nausea).   oseltamivir (TAMIFLU) 75 MG capsule, Take 1 capsule (75 mg total) by mouth every 12 (twelve) hours. (Patient not taking: Reported on 06/20/2022)  Medical History:  Past Medical History:  Diagnosis Date   Endometriosis 2015   Essential hypertension    Gestational diabetes    Mixed hyperlipidemia    Morbid obesity ([place])    Obstructive sleep apnea    Pap smear abnormality of cervix    Smoker    Allergies: No Known Allergies   Surgical History:  She  has a past surgical history that includes Tonsilectomy/adenoidectomy with myringotomy; Wisdom teeth abstracted; Cesarean section (N/A, 11/05/2016); and Cesarean section (N/A, 12/28/2018). Family History:  Her family history includes Diabetes in her father and mother; Hypertension in her father and mother.  REVIEW OF SYSTEMS  : All other systems reviewed and negative except where noted in the History of Present Illness.  PHYSICAL EXAM: BP 126/70   Pulse 60   Ht '5\' 4"'$  (1.626 m)   Wt (!) 302 lb (137 kg)   BMI 51.84 kg/m  General:   Pleasant, well developed female in no acute distress Head:   Normocephalic and atraumatic. Eyes:  sclerae anicteric,conjunctive pink  Heart:   regular rate  and rhythm, no murmurs or gallops Pulm:  Clear anteriorly; no wheezing Abdomen:   Soft, Obese AB, Sluggish bowel sounds. mild tenderness in the LLQ. Without guarding and Without rebound, No organomegaly appreciated. Rectal: declines Extremities:  Without edema. Msk: Symmetrical without gross deformities. Peripheral pulses intact.  Neurologic:  Alert and  oriented x4;  No focal deficits.  Skin:   Dry and intact without significant lesions or rashes. Psychiatric:  Cooperative. Normal mood and affect.  RELEVANT LABS AND IMAGING: CBC    Component Value Date/Time   WBC 12.6 (H) 03/09/2022 1348   RBC 4.84 03/09/2022 1348   HGB 13.0 03/09/2022 1348   HGB 12.3 10/09/2018 0931   HCT 40.0 03/09/2022 1348   HCT 37.4 10/09/2018 0931   PLT 371 03/09/2022 1348   PLT 383 10/09/2018 0931   MCV 82.6 03/09/2022 1348   MCV 84 10/09/2018 0931   MCH 26.9 03/09/2022 1348   MCHC 32.5 03/09/2022 1348   RDW 13.8 03/09/2022 1348   RDW 14.8 10/09/2018 0931   LYMPHSABS 2.2 03/09/2022 1348   LYMPHSABS 2.8 06/13/2018 1107   MONOABS 1.4 (H) 03/09/2022 1348   EOSABS 0.1 03/09/2022 1348   EOSABS 0.1 06/13/2018 1107   BASOSABS 0.0 03/09/2022 1348   BASOSABS 0.0 06/13/2018 1107    CMP     Component Value Date/Time   NA 141 03/09/2022 1348   K 4.3 03/09/2022  1348   CL 104 03/09/2022 1348   CO2 25 03/09/2022 1348   GLUCOSE 89 03/09/2022 1348   BUN 10 03/09/2022 1348   CREATININE 1.34 (H) 03/09/2022 1348   CREATININE 0.99 08/21/2012 1431   CALCIUM 9.3 03/09/2022 1348   PROT 7.2 03/09/2022 1348   ALBUMIN 3.8 03/09/2022 1348   AST 19 03/09/2022 1348   ALT 21 03/09/2022 1348   ALKPHOS 55 03/09/2022 1348   BILITOT 0.5 03/09/2022 1348   GFRNONAA 53 (L) 03/09/2022 1348   GFRAA >60 12/30/2018 Savanna Myron Stankovich, PA-C 2:09 PM

## 2022-06-20 NOTE — Patient Instructions (Signed)
Set up CT AB and pelvis with contrast  You have been scheduled for a CT scan of the abdomen and pelvis at [facility] located at [facility], Basement [facility] Radiology. You are scheduled on Tuesday, 06-26-22 at 6:00pm. You should arrive 15 minutes prior to your appointment time for registration.  We are giving you 2 bottles of contrast today that you will need to drink before arriving for the exam. The solution may taste better if refrigerated so put them in the refrigerator when you get home, but do NOT add ice or any other liquid to this solution as that would dilute it. Shake well before drinking.   Please follow the written instructions below on the day of your exam:   1) Do not eat anything after 2:00pm (4 hours prior to your test)   2) Drink 1 bottle of contrast @ 4:00pm (2 hours prior to your exam)  Remember to shake well before drinking and do NOT pour over ice.     Drink 1 bottle of contrast @ 5:00pm (1 hour prior to your exam)   You may take any medications as prescribed with a small amount of water, if necessary. If you take any of the following medications: METFORMIN, GLUCOPHAGE, GLUCOVANCE, AVANDAMET, RIOMET, FORTAMET, [place] MET, JANUMET, GLUMETZA or METAGLIP, you MAY be asked to HOLD this medication 48 hours AFTER the exam.   The purpose of you drinking the oral contrast is to aid in the visualization of your intestinal tract. The contrast solution may cause some diarrhea. Depending on your individual set of symptoms, you may also receive an intravenous injection of x-ray contrast/dye. Plan on being at [facility] for 45 minutes or longer, depending on the type of exam you are having performed.   If you have any questions regarding your exam or if you need to reschedule, you may call [facility] at [phone] between the hours of 8:00 am and 5:00 pm, Monday-Friday.     Your provider has requested that you go to the basement level for lab  work before leaving today. Press "B" on the elevator. The lab is located at the first door on the left as you exit the elevator.  We have sent the following medications to your pharmacy for you to pick up at your convenience: Hyoscyamine      Diverticulitis Diverticulitis is inflammation or infection of small pouches in your colon that form when you have a condition called diverticulosis. The pouches in your colon are called diverticula. Your colon, or large intestine, is where water is absorbed and stool is formed. Complications of diverticulitis can include: Bleeding. Severe infection. Severe pain. Perforation of your colon. Obstruction of your colon.  What are the causes? Diverticulitis is caused by bacteria. Diverticulitis happens when stool becomes trapped in diverticula. This allows bacteria to grow in the diverticula, which can lead to inflammation and infection. What increases the risk? People with diverticulosis are at risk for diverticulitis. Eating a diet that does not include enough fiber from fruits and vegetables may make diverticulitis more likely to develop. What are the signs or symptoms? Symptoms of diverticulitis may include: Abdominal pain and tenderness. The pain is normally located on the left side of the abdomen, but may occur in other areas. Fever and chills. Bloating. Cramping. Nausea. Vomiting. Constipation. Diarrhea. Blood in your stool.  How is this diagnosed? Your health care provider will ask you about your medical history and do a physical exam. You may need  to have tests done because many medical conditions can cause the same symptoms as diverticulitis. Tests may include: Blood tests. Urine tests. Imaging tests of the abdomen, including X-rays and CT scans.  When your condition is under control, your health care provider may recommend that you have a colonoscopy. A colonoscopy can show how severe your diverticula are and whether something else  is causing your symptoms. How is this treated? Most cases of diverticulitis are mild and can be treated at home. Treatment may include: Taking over-the-counter pain medicines. Following a clear liquid diet. Taking antibiotic medicines by mouth for 7-10 days.  More severe cases may be treated at a hospital. Treatment may include: Not eating or drinking. Taking prescription pain medicine. Receiving antibiotic medicines through an IV tube. Receiving fluids and nutrition through an IV tube. Surgery.  Follow these instructions at home: Follow your health care provider's instructions carefully. Follow a full liquid diet or other diet as directed by your health care provider. After your symptoms improve, your health care provider may tell you to change your diet. He or she may recommend you eat a high-fiber diet. Fruits and vegetables are good sources of fiber. Fiber makes it easier to pass stool. Take fiber supplements or probiotics as directed by your health care provider. Only take medicines as directed by your health care provider. Keep all your follow-up appointments. Contact a health care provider if: Your pain does not improve. You have a hard time eating food. Your bowel movements do not return to normal. Get help right away if: Your pain becomes worse. Your symptoms do not get better. Your symptoms suddenly get worse. You have a fever. You have repeated vomiting. You have bloody or black, tarry stools. This information is not intended to replace advice given to you by your health care provider. Make sure you discuss any questions you have with your health care provider. Document Released: 02/28/2005 Document Revised: 10/27/2015 Document Reviewed: 04/15/2013 Elsevier Interactive Patient Education  2017 [place] do a trial off milk/lactose products if you use them.  Add fiber like benefiber or citracel once a day Increase activity Can do trial of IBGard which is  over the counter for AB pain- Take 1-2 capsules once a day for maintence or twice a day during a flare Can send in an anti spasm medication, Levsin, to take as needed Please try to decrease stress. consider talking with PCP about anti anxiety medication or try head space app for meditation. if any worsening symptoms like blood in stool, weight loss, please call the office   Please try low FODMAP diet- see below- start with eliminating just one column at a time, the table at the very bottom contains foods that are safe to take   FODMAP stands for fermentable oligo-, di-, mono-saccharides and polyols (1). These are the scientific terms used to classify groups of carbs that are notorious for triggering digestive symptoms like bloating, gas and stomach pain.      Thank you for entrusting me with your care and for choosing [facility], Vicie Mutters, P.A.-C  .

## 2022-06-21 MED ORDER — AMOXICILLIN-POT CLAVULANATE 875-125 MG PO TABS: 1.0000 | ORAL_TABLET | Freq: Two times a day (BID) | ORAL | 0 refills | Status: AC

## 2022-06-22 NOTE — Progress Notes (Signed)
Agree with the assessment and plan as outlined by Vicie Mutters,  PA-C.  Patient's continued smoking will be her most significant risk factor for recurrent diverticulitis (in addition to her obesity).  She needs to stop smoking or she will continue to be at high risk for recurrent diverticulitis  Overton Boggus E. Candis Schatz, MD

## 2022-06-26 ENCOUNTER — Other Ambulatory Visit: Payer: Self-pay

## 2022-06-26 ENCOUNTER — Ambulatory Visit (HOSPITAL_BASED_OUTPATIENT_CLINIC_OR_DEPARTMENT_OTHER)
Admission: RE | Admit: 2022-06-26 | Discharge: 2022-06-26 | Disposition: A | Discharge status: Home / Self Care | Payer: Medicaid Other | Source: Ambulatory Visit | Attending: Physician Assistant | Admitting: Physician Assistant

## 2022-06-26 ENCOUNTER — Emergency Department (HOSPITAL_BASED_OUTPATIENT_CLINIC_OR_DEPARTMENT_OTHER)
Admission: EM | Admit: 2022-06-26 | Discharge: 2022-06-26 | Disposition: A | Discharge status: Home / Self Care | Payer: Medicaid Other | Attending: Emergency Medicine | Admitting: Emergency Medicine

## 2022-06-26 ENCOUNTER — Encounter (HOSPITAL_BASED_OUTPATIENT_CLINIC_OR_DEPARTMENT_OTHER): Payer: Self-pay

## 2022-06-26 DIAGNOSIS — Z5321 Procedure and treatment not carried out due to patient leaving prior to being seen by health care provider: Secondary | ICD-10-CM | POA: Diagnosis not present

## 2022-06-26 DIAGNOSIS — R109 Unspecified abdominal pain: Secondary | ICD-10-CM | POA: Insufficient documentation

## 2022-06-26 DIAGNOSIS — R1030 Lower abdominal pain, unspecified: Secondary | ICD-10-CM | POA: Insufficient documentation

## 2022-06-26 MED ORDER — IOHEXOL 300 MG/ML  SOLN
100.0000 mL | Freq: Once | INTRAMUSCULAR | Status: AC | PRN
  Administered 2022-06-26: 100 mL via INTRAVENOUS

## 2022-06-26 NOTE — ED Triage Notes (Signed)
POV from home, sent over for CT abd and pelvis for diffuse abd pain x 1 wk, scheduled with radiology. Called radiology to confirm.

## 2022-06-26 NOTE — ED Notes (Signed)
  Patient registered and triaged but was only here for outpatient scan.

## 2022-08-08 ENCOUNTER — Telehealth: Payer: Self-pay

## 2022-08-08 NOTE — Telephone Encounter (Signed)
Patient called and left a vm requesting a refill for her allergy medicine.  No answer. LVM for patient to return call to the office.

## 2022-08-30 ENCOUNTER — Ambulatory Visit
Admission: EM | Admit: 2022-08-30 | Discharge: 2022-08-30 | Disposition: A | Discharge status: Home / Self Care | Payer: Medicaid Other

## 2022-08-30 DIAGNOSIS — H6502 Acute serous otitis media, left ear: Secondary | ICD-10-CM

## 2022-08-30 DIAGNOSIS — J069 Acute upper respiratory infection, unspecified: Secondary | ICD-10-CM

## 2022-08-30 DIAGNOSIS — R509 Fever, unspecified: Secondary | ICD-10-CM | POA: Diagnosis not present

## 2022-08-30 LAB — POCT INFLUENZA A/B
Influenza A, POC: NEGATIVE
Influenza B, POC: NEGATIVE

## 2022-08-30 MED ORDER — ALBUTEROL SULFATE HFA 108 (90 BASE) MCG/ACT IN AERS: 2.0000 | INHALATION_SPRAY | Freq: Four times a day (QID) | RESPIRATORY_TRACT | 0 refills | Status: AC | PRN

## 2022-08-30 MED ORDER — AMOXICILLIN 500 MG PO CAPS: 500.0000 mg | ORAL_CAPSULE | Freq: Three times a day (TID) | ORAL | 0 refills | Status: AC

## 2022-08-30 NOTE — Discharge Instructions (Addendum)
Advised take Tylenol or ibuprofen as needed for fever and body aches. Advised to continue to use the albuterol inhaler, 2 puffs every 6 hours as needed for wheezing and cough. Advised take the amoxicillin 500 mg every 8 hours to treat the ear infection.  Advised to follow-up PCP return to urgent care as needed.

## 2022-08-30 NOTE — ED Provider Notes (Signed)
[facility]    CSN: [id number] Arrival date & time: 08/30/22  0816      History   Chief Complaint Chief Complaint  Patient presents with   URI    HPI Ashley Costa is a 37 y.o. female.   37 year old female presents with fever, cough and congestion.  Patient indicates for the past 3 days she has been having persistent upper respiratory congestion mainly sinus maxillary pressure, postnasal drip with rhinitis which is mainly thick and purulent.  Patient also indicates she has been having bilateral ear pressure with left ear pain.  She also indicates having chest congestion with intermittent cough, wheezing and shortness of breath which is intermittent and is improved with her albuterol inhaler.  Patient indicates that she has been having fever, chills, body aches, fatigue and lethargy.  Patient indicates she is tolerating fluids well and does not recall being around any family, friends or coworkers that been sick.  Patient believes that she may have the flu and request a flu test to be performed.   URI Presenting symptoms: cough, fatigue, fever and rhinorrhea   Associated symptoms: wheezing     Past Medical History:  Diagnosis Date   Endometriosis 2015   Essential hypertension    Gestational diabetes    Mixed hyperlipidemia    Morbid obesity ([place])    Obstructive sleep apnea    Pap smear abnormality of cervix    Smoker     Patient Active Problem List   Diagnosis Date Noted   Nexplanon in place 12/30/2018   HSIL (high grade squamous intraepithelial lesion) on Pap smear of cervix 10/23/2018   Gestational diabetes mellitus (GDM) in third trimester controlled on oral hypoglycemic drug 10/23/2018   History of primary cesarean section 10/09/2018   Status post primary low transverse cesarean section 11/08/2016   Maternal morbid obesity, antepartum ([place]) 09/04/2016   Tobacco abuse 08/06/2012   Obesity, Class III, BMI 40-49.9 (morbid obesity) ([place]) 08/06/2012     Past Surgical History:  Procedure Laterality Date   CESAREAN SECTION N/A 11/05/2016   Procedure: CESAREAN SECTION;  Surgeon: Aletha Halim, MD;  Location: [facility];  Service: Obstetrics;  Laterality: N/A;   CESAREAN SECTION N/A 12/28/2018   Procedure: CESAREAN SECTION;  Surgeon: Osborne Oman, MD;  Location: [facility];  Service: Obstetrics;  Laterality: N/A;   TONSILECTOMY/ADENOIDECTOMY WITH MYRINGOTOMY     Wisdom teeth abstracted      OB History     Gravida  2   Para  1   Term  1   Preterm      AB      Living  1      SAB      IAB      Ectopic      Multiple  0   Live Births  1            Home Medications    Prior to Admission medications   Medication Sig Start Date End Date Taking? Authorizing Provider  amoxicillin (AMOXIL) 500 MG capsule Take 1 capsule (500 mg total) by mouth 3 (three) times daily. 08/30/22  Yes Nyoka Lint, PA-C  phentermine (ADIPEX-P) 37.5 MG tablet Take 37.5 mg by mouth daily. 08/13/22  Yes [provider]  albuterol (VENTOLIN HFA) 108 (90 Base) MCG/ACT inhaler Inhale 2 puffs into the lungs every 6 (six) hours as needed for wheezing or shortness of breath. 08/30/22   Nyoka Lint, PA-C  amLODipine (NORVASC) 2.5 MG tablet Take  2.5 mg by mouth every evening. Patient not taking: Reported on 06/20/2022    [provider]  amoxicillin-clavulanate (AUGMENTIN) 875-125 MG tablet Take 1 tablet by mouth 2 (two) times daily. 06/21/22   Vladimir Crofts, PA-C  benzonatate (TESSALON) 100 MG capsule Take 1 capsule (100 mg total) by mouth every 8 (eight) hours. Patient not taking: Reported on 06/20/2022 04/20/22   Flossie Dibble, NP  dicyclomine (BENTYL) 20 MG tablet Take 1 tablet (20 mg total) by mouth every 6 (six) hours. 06/05/22   Daryel November, MD  hydrocortisone (ANUSOL-HC) 2.5 % rectal cream Place 1 Application rectally 2 (two) times daily. 06/20/22   Vladimir Crofts, PA-C  hyoscyamine (LEVSIN) 0.125 MG  tablet Take 1 tablet (0.125 mg total) by mouth every 6 (six) hours as needed for cramping (diarrhea,nausea). 06/20/22   Vladimir Crofts, PA-C  loratadine (CLARITIN) 10 MG tablet Take 1 tablet (10 mg total) by mouth daily. Patient taking differently: Take 10 mg by mouth daily as needed for allergies. 08/02/21   Shelly Bombard, MD  oseltamivir (TAMIFLU) 75 MG capsule Take 1 capsule (75 mg total) by mouth every 12 (twelve) hours. Patient not taking: Reported on 06/20/2022 04/20/22   Flossie Dibble, NP  oxyCODONE-acetaminophen (PERCOCET/ROXICET) 5-325 MG tablet Take 1 tablet by mouth every 6 (six) hours as needed for severe pain. Patient not taking: Reported on 06/20/2022 03/09/22   Lacretia Leigh, MD  promethazine-dextromethorphan (PROMETHAZINE-DM) 6.25-15 MG/5ML syrup Take 5 mLs by mouth at bedtime. Patient not taking: Reported on 06/20/2022 04/20/22   Flossie Dibble, NP    Family History Family History  Problem Relation Age of Onset   Diabetes Mother    Hypertension Mother    Diabetes Father    Hypertension Father    Stomach cancer Neg Hx    Pancreatic cancer Neg Hx    Esophageal cancer Neg Hx    Colon cancer Neg Hx     Social History Social History   Tobacco Use   Smoking status: Every Day    Packs/day: .25    Types: Cigarettes   Smokeless tobacco: Never   Tobacco comments:    States she smokes a pack a week  Vaping Use   Vaping Use: Never used  Substance Use Topics   Alcohol use: Yes    Comment: 1 drink per day   Drug use: No     Allergies   Patient has no known allergies.   Review of Systems Review of Systems  Constitutional:  Positive for fatigue and fever.  HENT:  Positive for postnasal drip and rhinorrhea.   Respiratory:  Positive for cough and wheezing.      Physical Exam Triage Vital Signs ED Triage Vitals  Enc Vitals Group     BP 08/30/22 0848 (!) 164/86     Pulse Rate 08/30/22 0846 82     Resp 08/30/22 0846 19     Temp 08/30/22 0846 97.9  F (36.6 C)     Temp Source 08/30/22 0846 Oral     SpO2 08/30/22 0846 96 %     Weight --      Height --      Head Circumference --      Peak Flow --      Pain Score 08/30/22 0844 0     Pain Loc --      Pain Edu? --      Excl. in [place]? --    No data found.  Updated Vital Signs BP (!) 164/86   Pulse 82   Temp 97.9 F (36.6 C) (Oral)   Resp 19   LMP 08/02/2022 (Approximate)   SpO2 96%   Visual Acuity Right Eye Distance:   Left Eye Distance:   Bilateral Distance:    Right Eye Near:   Left Eye Near:    Bilateral Near:     Physical Exam Constitutional:      Appearance: Normal appearance.  HENT:     Right Ear: Ear canal normal. Tympanic membrane is injected.     Left Ear: Ear canal normal. Tympanic membrane is erythematous.     Mouth/Throat:     Mouth: Mucous membranes are moist.     Pharynx: Oropharynx is clear.  Cardiovascular:     Rate and Rhythm: Normal rate and regular rhythm.     Heart sounds: Normal heart sounds.  Pulmonary:     Effort: Pulmonary effort is normal.     Breath sounds: Normal breath sounds and air entry. No wheezing, rhonchi or rales.  Lymphadenopathy:     Cervical: No cervical adenopathy.  Neurological:     Mental Status: She is alert.      UC Treatments / Results  Labs (all labs ordered are listed, but only abnormal results are displayed) Labs Reviewed  POCT INFLUENZA A/B    EKG   Radiology No results found.  Procedures Procedures (including critical care time)  Medications Ordered in UC Medications - No data to display  Initial Impression / Assessment and Plan / UC Course  I have reviewed the triage vital signs and the nursing notes.  Pertinent labs & imaging results that were available during my care of the patient were reviewed by me and considered in my medical decision making (see chart for details).    Plan: The diagnosis will be treated with the following: 1.  Acute upper respiratory infection: A.  Advised to  take Mucinex DM every 12 hours for cough and congestion. B.  Advise use albuterol inhaler, 2 puffs every 6 hours as needed to control wheezing, cough and congestion. 2.  Acute otitis media left ear: A.  Amoxil 500 mg every 8 hours to treat ear infection. 3.  Fever: A.  Advised take Tylenol or ibuprofen as needed for fever and chills. 4.  Advised to follow-up PCP return to urgent care as needed. Final Clinical Impressions(s) / UC Diagnoses   Final diagnoses:  Acute upper respiratory infection  Non-recurrent acute serous otitis media of left ear  Fever, unspecified     Discharge Instructions      Advised take Tylenol or ibuprofen as needed for fever and body aches. Advised to continue to use the albuterol inhaler, 2 puffs every 6 hours as needed for wheezing and cough. Advised take the amoxicillin 500 mg every 8 hours to treat the ear infection.  Advised to follow-up PCP return to urgent care as needed.     ED Prescriptions     Medication Sig Dispense Auth. Provider   albuterol (VENTOLIN HFA) 108 (90 Base) MCG/ACT inhaler Inhale 2 puffs into the lungs every 6 (six) hours as needed for wheezing or shortness of breath. 6.7 g Nyoka Lint, PA-C   amoxicillin (AMOXIL) 500 MG capsule Take 1 capsule (500 mg total) by mouth 3 (three) times daily. 21 capsule Nyoka Lint, PA-C      PDMP not reviewed this encounter.   Nyoka Lint, PA-C 08/30/22 [id number]

## 2022-08-30 NOTE — ED Triage Notes (Signed)
Pt presents to [facility] with co of chills, congestion, sweats, runny nose, and cough for since Tuesday. Reports she thinks she has been having fevers but has not checked them.  Pt reports she thinks she may have the flu. She has been taking otc flu and cold meds

## 2023-02-11 ENCOUNTER — Other Ambulatory Visit: Payer: Self-pay | Admitting: Physician Assistant

## 2023-02-11 DIAGNOSIS — R1030 Lower abdominal pain, unspecified: Secondary | ICD-10-CM

## 2023-09-03 ENCOUNTER — Emergency Department (HOSPITAL_BASED_OUTPATIENT_CLINIC_OR_DEPARTMENT_OTHER)

## 2023-09-03 ENCOUNTER — Encounter (HOSPITAL_BASED_OUTPATIENT_CLINIC_OR_DEPARTMENT_OTHER): Payer: Self-pay | Admitting: Emergency Medicine

## 2023-09-03 ENCOUNTER — Emergency Department (HOSPITAL_BASED_OUTPATIENT_CLINIC_OR_DEPARTMENT_OTHER)
Admission: EM | Admit: 2023-09-03 | Discharge: 2023-09-03 | Disposition: A | Discharge status: Home / Self Care | Attending: Emergency Medicine | Admitting: Emergency Medicine

## 2023-09-03 ENCOUNTER — Other Ambulatory Visit: Payer: Self-pay

## 2023-09-03 DIAGNOSIS — K5732 Diverticulitis of large intestine without perforation or abscess without bleeding: Secondary | ICD-10-CM | POA: Insufficient documentation

## 2023-09-03 DIAGNOSIS — R1030 Lower abdominal pain, unspecified: Secondary | ICD-10-CM | POA: Diagnosis present

## 2023-09-03 LAB — COMPREHENSIVE METABOLIC PANEL WITH GFR
ALT: 16 U/L (ref 0–44)
AST: 14 U/L — ABNORMAL LOW (ref 15–41)
Albumin: 4.1 g/dL (ref 3.5–5.0)
Alkaline Phosphatase: 46 U/L (ref 38–126)
Anion gap: 8 (ref 5–15)
BUN: 9 mg/dL (ref 6–20)
CO2: 26 mmol/L (ref 22–32)
Calcium: 8.7 mg/dL — ABNORMAL LOW (ref 8.9–10.3)
Chloride: 107 mmol/L (ref 98–111)
Creatinine, Ser: 0.99 mg/dL (ref 0.44–1.00)
GFR, Estimated: >60 mL/min (ref >60)
Glucose, Bld: 91 mg/dL (ref 70–99)
Potassium: 3.7 mmol/L (ref 3.5–5.1)
Sodium: 141 mmol/L (ref 135–145)
Total Bilirubin: 0.6 mg/dL (ref 0.0–1.2)
Total Protein: 7.1 g/dL (ref 6.5–8.1)

## 2023-09-03 LAB — CBC WITH DIFFERENTIAL/PLATELET
Abs Immature Granulocytes: 0.03 10*3/uL (ref 0.00–0.07)
Basophils Absolute: 0 10*3/uL (ref 0.0–0.1)
Basophils Relative: 0 %
Eosinophils Absolute: 0.2 10*3/uL (ref 0.0–0.5)
Eosinophils Relative: 2 %
HCT: 38.6 % (ref 36.0–46.0)
Hemoglobin: 13 g/dL (ref 12.0–15.0)
Immature Granulocytes: 0 %
Lymphocytes Relative: 25 %
Lymphs Abs: 2.4 10*3/uL (ref 0.7–4.0)
MCH: 27.7 pg (ref 26.0–34.0)
MCHC: 33.7 g/dL (ref 30.0–36.0)
MCV: 82.1 fL (ref 80.0–100.0)
Monocytes Absolute: 0.8 10*3/uL (ref 0.1–1.0)
Monocytes Relative: 9 %
Neutro Abs: 5.9 10*3/uL (ref 1.7–7.7)
Neutrophils Relative %: 64 %
Platelets: 327 10*3/uL (ref 150–400)
RBC: 4.7 MIL/uL (ref 3.87–5.11)
RDW: 13.2 % (ref 11.5–15.5)
WBC: 9.5 10*3/uL (ref 4.0–10.5)
nRBC: 0 % (ref 0.0–0.2)

## 2023-09-03 LAB — URINALYSIS, ROUTINE W REFLEX MICROSCOPIC
Bilirubin Urine: NEGATIVE
Glucose, UA: NEGATIVE mg/dL
Hgb urine dipstick: NEGATIVE
Ketones, ur: NEGATIVE mg/dL
Leukocytes,Ua: NEGATIVE
Nitrite: NEGATIVE
Protein, ur: NEGATIVE mg/dL
Specific Gravity, Urine: 1.014 (ref 1.005–1.030)
pH: 6 (ref 5.0–8.0)

## 2023-09-03 LAB — LIPASE, BLOOD: Lipase: 20 U/L (ref 11–51)

## 2023-09-03 LAB — PREGNANCY, URINE: Preg Test, Ur: NEGATIVE

## 2023-09-03 MED ORDER — IOHEXOL 300 MG/ML  SOLN
100.0000 mL | Freq: Once | INTRAMUSCULAR | Status: AC | PRN
  Administered 2023-09-03: 100 mL via INTRAVENOUS

## 2023-09-03 MED ORDER — MORPHINE SULFATE (PF) 4 MG/ML IV SOLN
4.0000 mg | Freq: Once | INTRAVENOUS | Status: AC
  Administered 2023-09-03: 4 mg via INTRAVENOUS
  Filled 2023-09-03: qty 1

## 2023-09-03 MED ORDER — FENTANYL CITRATE PF 50 MCG/ML IJ SOSY
50.0000 ug | PREFILLED_SYRINGE | Freq: Once | INTRAMUSCULAR | Status: AC
  Administered 2023-09-03: 50 ug via INTRAVENOUS
  Filled 2023-09-03: qty 1

## 2023-09-03 MED ORDER — FLUCONAZOLE 150 MG PO TABS: 150.0000 mg | ORAL_TABLET | Freq: Every day | ORAL | 0 refills | Status: AC

## 2023-09-03 MED ORDER — ONDANSETRON HCL 4 MG/2ML IJ SOLN
4.0000 mg | Freq: Once | INTRAMUSCULAR | Status: AC
  Administered 2023-09-03: 4 mg via INTRAVENOUS
  Filled 2023-09-03: qty 2

## 2023-09-03 MED ORDER — METRONIDAZOLE 500 MG PO TABS: 500.0000 mg | ORAL_TABLET | Freq: Three times a day (TID) | ORAL | 0 refills | Status: AC

## 2023-09-03 MED ORDER — MORPHINE SULFATE (PF) 4 MG/ML IV SOLN
4.0000 mg | Freq: Once | INTRAVENOUS | Status: DC
  Filled 2023-09-03: qty 1

## 2023-09-03 MED ORDER — CIPROFLOXACIN HCL 500 MG PO TABS: 500.0000 mg | ORAL_TABLET | Freq: Two times a day (BID) | ORAL | 0 refills | Status: AC

## 2023-09-03 NOTE — ED Notes (Signed)
 Pt d/c at 1518 but not removed from system. Pain assessment entered is reflected of last pain assessment entered into Epic.

## 2023-09-03 NOTE — ED Notes (Signed)
 Lab needs lt green redraw, josh notified

## 2023-09-03 NOTE — ED Triage Notes (Signed)
 Pt tearful in triage, c/o lower abd pain with nausea x 3 days, hx of diverticulitis

## 2023-09-03 NOTE — Discharge Instructions (Addendum)
 He was seen in the emergency department today for concerns of abdominal pain.  Your labs were thankfully all normal and reassuring and your CT scan appears to show findings of sigmoid diverticulitis. I have sent two antibiotics to your pharmacy to help manage this including metronidazole and ciprofloxacin.

## 2023-09-03 NOTE — ED Provider Notes (Signed)
 [place] EMERGENCY DEPARTMENT AT [facility] Provider Note   CSN: [id number] Arrival date & time: 09/03/23  4098     History Chief Complaint  Patient presents with   Abdominal Pain    Ashley Costa is a 38 y.o. female.  Patient past history significant for obesity, prior C-section, abnormal Pap smear, gestational diabetes presents to the emergency department today with concerns of abdominal pain.  Patient reports that she has been experiencing lower abdominal pain with nausea for the last 3 days or so.  Reports history of diverticulitis but symptoms not typically this bad.  Has previously seen GI and was advised that she has IBS and has been started on medications but has not been using these recently.  No recent fever, chills, body aches.  Denies any upper abdominal pain reports all pain is in the lower abdomen.  States that she has some discomfort with urination.  Not sexually active at this time and has not been reportedly active for the last 6 years.   Abdominal Pain      Home Medications Prior to Admission medications   Medication Sig Start Date End Date Taking? Authorizing Provider  ciprofloxacin (CIPRO) 500 MG tablet Take 1 tablet (500 mg total) by mouth every 12 (twelve) hours for 5 days. 09/03/23 09/08/23 Yes Smitty Knudsen, PA-C  fluconazole (DIFLUCAN) 150 MG tablet Take 1 tablet (150 mg total) by mouth daily. 09/03/23  Yes Smitty Knudsen, PA-C  metroNIDAZOLE (FLAGYL) 500 MG tablet Take 1 tablet (500 mg total) by mouth 3 (three) times daily for 5 days. 09/03/23 09/08/23 Yes Smitty Knudsen, PA-C  albuterol (VENTOLIN HFA) 108 (90 Base) MCG/ACT inhaler Inhale 2 puffs into the lungs every 6 (six) hours as needed for wheezing or shortness of breath. 08/30/22   Ellsworth Lennox, PA-C  amLODipine (NORVASC) 2.5 MG tablet Take 2.5 mg by mouth every evening. Patient not taking: Reported on 06/20/2022    [provider]  amoxicillin (AMOXIL) 500 MG capsule Take 1 capsule (500  mg total) by mouth 3 (three) times daily. 08/30/22   Ellsworth Lennox, PA-C  amoxicillin-clavulanate (AUGMENTIN) 875-125 MG tablet Take 1 tablet by mouth 2 (two) times daily. 06/21/22   Doree Albee, PA-C  benzonatate (TESSALON) 100 MG capsule Take 1 capsule (100 mg total) by mouth every 8 (eight) hours. Patient not taking: Reported on 06/20/2022 04/20/22   Debby Freiberg, NP  dicyclomine (BENTYL) 20 MG tablet Take 1 tablet (20 mg total) by mouth every 6 (six) hours. 06/05/22   Jenel Lucks, MD  hydrocortisone (ANUSOL-HC) 2.5 % rectal cream Place 1 Application rectally 2 (two) times daily. 06/20/22   Doree Albee, PA-C  hyoscyamine (LEVSIN) 0.125 MG tablet TAKE 1 TABLET(0.125 MG) BY MOUTH EVERY 6 HOURS AS NEEDED FOR CRAMPING OR DIARRHEA OR NAUSEA 02/11/23   Doree Albee, PA-C  loratadine (CLARITIN) 10 MG tablet Take 1 tablet (10 mg total) by mouth daily. Patient taking differently: Take 10 mg by mouth daily as needed for allergies. 08/02/21   Brock Bad, MD  oseltamivir (TAMIFLU) 75 MG capsule Take 1 capsule (75 mg total) by mouth every 12 (twelve) hours. Patient not taking: Reported on 06/20/2022 04/20/22   Debby Freiberg, NP  oxyCODONE-acetaminophen (PERCOCET/ROXICET) 5-325 MG tablet Take 1 tablet by mouth every 6 (six) hours as needed for severe pain. Patient not taking: Reported on 06/20/2022 03/09/22   Lorre Nick, MD  phentermine (ADIPEX-P) 37.5 MG tablet Take 37.5 mg by mouth  daily. 08/13/22   [provider]  promethazine-dextromethorphan (PROMETHAZINE-DM) 6.25-15 MG/5ML syrup Take 5 mLs by mouth at bedtime. Patient not taking: Reported on 06/20/2022 04/20/22   Debby Freiberg, NP      Allergies    Patient has no known allergies.    Review of Systems   Review of Systems  Gastrointestinal:  Positive for abdominal pain.  All other systems reviewed and are negative.   Physical Exam Updated Vital Signs BP 131/74 (BP Location: Right Arm)   Pulse 71    Temp 98 F (36.7 C)   Resp 16   Wt (!) 138.3 kg   LMP 07/10/2023   SpO2 94%   BMI 52.35 kg/m  Physical Exam Vitals and nursing note reviewed.  Constitutional:      General: She is not in acute distress.    Appearance: She is well-developed.     Comments: Tearful  HENT:     Head: Normocephalic and atraumatic.  Eyes:     Conjunctiva/sclera: Conjunctivae normal.  Cardiovascular:     Rate and Rhythm: Normal rate and regular rhythm.     Heart sounds: No murmur heard. Pulmonary:     Effort: Pulmonary effort is normal. No respiratory distress.     Breath sounds: Normal breath sounds.  Abdominal:     Palpations: Abdomen is soft.     Tenderness: There is abdominal tenderness in the right lower quadrant and left lower quadrant. There is no right CVA tenderness, left CVA tenderness or guarding. Negative signs include Murphy's sign.  Musculoskeletal:        General: No swelling.     Cervical back: Neck supple.  Skin:    General: Skin is warm and dry.     Capillary Refill: Capillary refill takes less than 2 seconds.  Neurological:     Mental Status: She is alert.  Psychiatric:        Mood and Affect: Mood normal.     ED Results / Procedures / Treatments   Labs (all labs ordered are listed, but only abnormal results are displayed) Labs Reviewed  COMPREHENSIVE METABOLIC PANEL WITH GFR - Abnormal; Notable for the following components:      Result Value   Calcium 8.7 (*)    AST 14 (*)    All other components within normal limits  CBC WITH DIFFERENTIAL/PLATELET  URINALYSIS, ROUTINE W REFLEX MICROSCOPIC  PREGNANCY, URINE  LIPASE, BLOOD    EKG None  Radiology CT ABDOMEN PELVIS W CONTRAST Result Date: 09/03/2023 CLINICAL DATA:  Lower abdominal pain.  History of diverticulitis. EXAM: CT ABDOMEN AND PELVIS WITH CONTRAST TECHNIQUE: Multidetector CT imaging of the abdomen and pelvis was performed using the standard protocol following bolus administration of intravenous contrast.  RADIATION DOSE REDUCTION: This exam was performed according to the departmental dose-optimization program which includes automated exposure control, adjustment of the mA and/or kV according to patient size and/or use of iterative reconstruction technique. CONTRAST:  OMNIPAQUE IOHEXOL 300 MG/ML  SOLN COMPARISON:  CT abdomen/pelvis dated 06/26/2022. FINDINGS: Lower chest: No acute abnormality. Hepatobiliary: No focal liver abnormality is seen. No gallstones, gallbladder wall thickening, or biliary dilatation. Pancreas: Unremarkable. No pancreatic ductal dilatation or surrounding inflammatory changes. Spleen: Normal in size without focal abnormality. Adrenals/Urinary Tract: Adrenal glands are unremarkable. Kidneys are normal, without renal calculi, focal lesion, or hydronephrosis. Bladder is unremarkable. Stomach/Bowel: Wall thickening with surrounding inflammatory stranding and trace fluid with probable inflamed diverticula involving an approximately 7 cm segment of the proximal to mid  sigmoid colon. No evidence of perforation or abscess. Surrounding sigmoid colonic diverticula. Stomach is within normal limits. Small bowel is unremarkable. No obstruction. Appendix appears normal. Vascular/Lymphatic: The abdominal aorta is normal in caliber with mild aortoiliac atherosclerotic calcification. No enlarged abdominal or pelvic lymph nodes. Reproductive: Uterus and bilateral adnexa are unremarkable. Other: No abdominal wall hernia.  No intraperitoneal free air. Musculoskeletal: No acute or significant osseous findings. IMPRESSION: Findings most compatible with sigmoid diverticulitis, however, nonspecific colitis can not be excluded. No evidence of perforation or abscess. Electronically Signed   By: Hart Robinsons M.D.   On: 09/03/2023 14:05    Procedures Procedures    Medications Ordered in ED Medications  ondansetron (ZOFRAN) injection 4 mg (4 mg Intravenous Given 09/03/23 1007)  fentaNYL (SUBLIMAZE)  injection 50 mcg (50 mcg Intravenous Given 09/03/23 1012)  iohexol (OMNIPAQUE) 300 MG/ML solution 100 mL (100 mLs Intravenous Contrast Given 09/03/23 1210)  morphine (PF) 4 MG/ML injection 4 mg (4 mg Intravenous Given 09/03/23 1327)    ED Course/ Medical Decision Making/ A&P                                 Medical Decision Making Amount and/or Complexity of Data Reviewed Labs: ordered. Radiology: ordered.  Risk Prescription drug management.    This patient presents to the ED for concern of abdominal pain.  Differential diagnosis includes diverticulitis, bowel obstruction, appendicitis, intra-abdominal abscess, UTI   Lab Tests:  I Ordered, and personally interpreted labs.  The pertinent results include: CBC unremarkable, CMP unremarkable, urine pregnancy negative, UA without evidence of infection, lipase unremarkable   Imaging Studies ordered:  I ordered imaging studies including CT abdomen pelvis I independently visualized and interpreted imaging which showed findings most consistent with sigmoid diverticulitis I agree with the radiologist interpretation   Medicines ordered and prescription drug management:  I ordered medication including fentanyl, morphine, Zofran for pain, nausea Reevaluation of the patient after these medicines showed that the patient improved I have reviewed the patients home medicines and have made adjustments as needed   Problem List / ED Course:  Patient with past history significant for diverticulitis presents emergency department today with concerns of abdominal pain.  She reports that she is experiencing abdominal pain in the lower abdomen for the last 3 days or so with associated nausea but denies any vomiting or diarrhea.  No recent fever, chills or bodyaches.  She states that she has not been around anyone with similar symptoms.  Has not tried any medication specifically for this. Physical exam reveals tenderness in the right and left lower  quadrants.  Doubtful appendicitis given presentation of bilateral lower abdominal pain.  Diverticulitis unlikely.  Will proceed with imaging and labs to assess source of patient's symptoms.  Ischial rounding Fentanyl and Zofran given for management. Lab workup is reassuring with no abnormality seen.  CT abdomen pelvis pending at this time. CT is consistent with findings of sigmoid diverticulitis.  This is likely source of patient's lower abdominal discomfort.  Discussed treatment with diet modifications and bowel rest over the next few days.  Patient would prefer to be treated with medications if possible.  I did discuss that antibiotics have notable side effects such as risk of C. difficile, medication reactions, etc. and patient would still prefer to proceed with antibiotic therapy.  A prescription for metronidazole and ciprofloxacin sent to patient's pharmacy.  Patient does report to me that she typically  will develop a yeast infection with antibiotic use and will send a one-time dose of fluconazole for possible treatment if yeast infection would arise.  Otherwise encourage close follow-up with primary care provider for repeat evaluation.  Patient verbalized understanding treatment plan and all return precautions.  Discharged home in stable condition.  Final Clinical Impression(s) / ED Diagnoses Final diagnoses:  Sigmoid diverticulitis    Rx / DC Orders ED Discharge Orders          Ordered    metroNIDAZOLE (FLAGYL) 500 MG tablet  3 times daily        09/03/23 1422    ciprofloxacin (CIPRO) 500 MG tablet  Every 12 hours        09/03/23 1422    fluconazole (DIFLUCAN) 150 MG tablet  Daily        09/03/23 1426              Smitty Knudsen, PA-C 09/03/23 1427    Ernie Avena, MD 09/03/23 1516

## 2023-09-03 NOTE — ED Notes (Signed)

## 2024-06-18 ENCOUNTER — Other Ambulatory Visit: Payer: Self-pay | Admitting: Physician Assistant

## 2024-06-18 ENCOUNTER — Other Ambulatory Visit: Payer: Self-pay | Admitting: Gastroenterology

## 2024-06-18 DIAGNOSIS — R1030 Lower abdominal pain, unspecified: Secondary | ICD-10-CM
